# Patient Record
Sex: Male | Born: 1960
Health system: Southern US, Community
[De-identification: ages and names within clinical notes are randomized; demographics above are authoritative.]

## PROBLEM LIST (undated history)

## (undated) DIAGNOSIS — E669 Obesity, unspecified: Secondary | ICD-10-CM

## (undated) DIAGNOSIS — F1020 Alcohol dependence, uncomplicated: Secondary | ICD-10-CM

## (undated) DIAGNOSIS — F32A Depression, unspecified: Secondary | ICD-10-CM

## (undated) DIAGNOSIS — F419 Anxiety disorder, unspecified: Secondary | ICD-10-CM

## (undated) DIAGNOSIS — K8020 Calculus of gallbladder without cholecystitis without obstruction: Secondary | ICD-10-CM

## (undated) DIAGNOSIS — E119 Type 2 diabetes mellitus without complications: Secondary | ICD-10-CM

## (undated) DIAGNOSIS — M1990 Unspecified osteoarthritis, unspecified site: Secondary | ICD-10-CM

## (undated) DIAGNOSIS — Z87442 Personal history of urinary calculi: Secondary | ICD-10-CM

## (undated) DIAGNOSIS — I10 Essential (primary) hypertension: Secondary | ICD-10-CM

## (undated) DIAGNOSIS — K219 Gastro-esophageal reflux disease without esophagitis: Secondary | ICD-10-CM

## (undated) DIAGNOSIS — N200 Calculus of kidney: Secondary | ICD-10-CM

## (undated) DIAGNOSIS — F329 Major depressive disorder, single episode, unspecified: Secondary | ICD-10-CM

## (undated) DIAGNOSIS — T8859XA Other complications of anesthesia, initial encounter: Secondary | ICD-10-CM

## (undated) DIAGNOSIS — R600 Localized edema: Secondary | ICD-10-CM

## (undated) HISTORY — DX: Calculus of kidney: N20.0

## (undated) HISTORY — PX: LEG SURGERY: SHX1003

## (undated) HISTORY — DX: Calculus of gallbladder without cholecystitis without obstruction: K80.20

## (undated) HISTORY — DX: Major depressive disorder, single episode, unspecified: F32.9

## (undated) HISTORY — DX: Gastro-esophageal reflux disease without esophagitis: K21.9

## (undated) HISTORY — DX: Unspecified osteoarthritis, unspecified site: M19.90

## (undated) HISTORY — PX: ANKLE SURGERY: SHX546

## (undated) HISTORY — PX: KNEE SURGERY: SHX244

## (undated) HISTORY — DX: Type 2 diabetes mellitus without complications: E11.9

## (undated) HISTORY — DX: Alcohol dependence, uncomplicated: F10.20

## (undated) HISTORY — DX: Essential (primary) hypertension: I10

## (undated) HISTORY — DX: Obesity, unspecified: E66.9

## (undated) HISTORY — DX: Anxiety disorder, unspecified: F41.9

## (undated) HISTORY — DX: Depression, unspecified: F32.A

---

## 2001-04-08 ENCOUNTER — Emergency Department (HOSPITAL_COMMUNITY): Admission: EM | Admit: 2001-04-08 | Discharge: 2001-04-08 | Payer: Self-pay | Admitting: Internal Medicine

## 2001-10-31 ENCOUNTER — Inpatient Hospital Stay (HOSPITAL_COMMUNITY): Admission: EM | Admit: 2001-10-31 | Discharge: 2001-11-01 | Payer: Self-pay | Admitting: *Deleted

## 2001-10-31 ENCOUNTER — Encounter: Payer: Self-pay | Admitting: *Deleted

## 2004-09-11 ENCOUNTER — Ambulatory Visit: Payer: Self-pay | Admitting: Internal Medicine

## 2004-09-13 ENCOUNTER — Encounter: Admission: RE | Admit: 2004-09-13 | Discharge: 2004-09-13 | Payer: Self-pay | Admitting: Internal Medicine

## 2004-09-25 ENCOUNTER — Ambulatory Visit: Payer: Self-pay | Admitting: Internal Medicine

## 2004-10-23 ENCOUNTER — Ambulatory Visit: Payer: Self-pay | Admitting: Internal Medicine

## 2004-10-30 ENCOUNTER — Ambulatory Visit: Payer: Self-pay | Admitting: Internal Medicine

## 2004-11-06 ENCOUNTER — Ambulatory Visit: Payer: Self-pay

## 2004-11-22 ENCOUNTER — Ambulatory Visit: Payer: Self-pay

## 2007-04-09 ENCOUNTER — Encounter: Admission: RE | Admit: 2007-04-09 | Discharge: 2007-04-09 | Payer: Self-pay | Admitting: Sports Medicine

## 2008-03-01 ENCOUNTER — Inpatient Hospital Stay (HOSPITAL_COMMUNITY): Admission: EM | Admit: 2008-03-01 | Discharge: 2008-03-09 | Payer: Self-pay | Admitting: Emergency Medicine

## 2009-11-19 ENCOUNTER — Encounter
Admission: RE | Admit: 2009-11-19 | Discharge: 2009-11-20 | Payer: Self-pay | Admitting: Physical Medicine & Rehabilitation

## 2009-11-20 ENCOUNTER — Ambulatory Visit: Payer: Self-pay | Admitting: Physical Medicine & Rehabilitation

## 2010-09-10 ENCOUNTER — Emergency Department (HOSPITAL_COMMUNITY)
Admission: EM | Admit: 2010-09-10 | Discharge: 2010-09-10 | Payer: Self-pay | Source: Home / Self Care | Admitting: Emergency Medicine

## 2010-09-11 LAB — URINALYSIS, ROUTINE W REFLEX MICROSCOPIC
Bilirubin Urine: NEGATIVE
Nitrite: POSITIVE — AB
Specific Gravity, Urine: 1.025 (ref 1.005–1.030)
Urine Glucose, Fasting: 250 mg/dL — AB
Urobilinogen, UA: 0.2 mg/dL (ref 0.0–1.0)
pH: 6 (ref 5.0–8.0)

## 2010-09-11 LAB — DIFFERENTIAL
Basophils Absolute: 0 10*3/uL (ref 0.0–0.1)
Basophils Relative: 0 % (ref 0–1)
Eosinophils Absolute: 0 10*3/uL (ref 0.0–0.7)
Eosinophils Relative: 0 % (ref 0–5)
Lymphocytes Relative: 9 % — ABNORMAL LOW (ref 12–46)
Lymphs Abs: 1.1 10*3/uL (ref 0.7–4.0)
Monocytes Absolute: 0.4 10*3/uL (ref 0.1–1.0)
Monocytes Relative: 3 % (ref 3–12)
Neutro Abs: 11.1 10*3/uL — ABNORMAL HIGH (ref 1.7–7.7)
Neutrophils Relative %: 88 % — ABNORMAL HIGH (ref 43–77)

## 2010-09-11 LAB — BASIC METABOLIC PANEL
BUN: 13 mg/dL (ref 6–23)
CO2: 22 mEq/L (ref 19–32)
Calcium: 9.1 mg/dL (ref 8.4–10.5)
Chloride: 106 mEq/L (ref 96–112)
Creatinine, Ser: 1.11 mg/dL (ref 0.4–1.5)
GFR calc Af Amer: >60 mL/min (ref >60)
GFR calc non Af Amer: >60 mL/min (ref >60)
Glucose, Bld: 161 mg/dL — ABNORMAL HIGH (ref 70–99)
Potassium: 4 mEq/L (ref 3.5–5.1)
Sodium: 137 mEq/L (ref 135–145)

## 2010-09-11 LAB — CBC
HCT: 43.6 % (ref 39.0–52.0)
Hemoglobin: 16.3 g/dL (ref 13.0–17.0)
MCH: 33.3 pg (ref 26.0–34.0)
MCHC: 37.4 g/dL — ABNORMAL HIGH (ref 30.0–36.0)
MCV: 89.2 fL (ref 78.0–100.0)
Platelets: 178 10*3/uL (ref 150–400)
RBC: 4.89 MIL/uL (ref 4.22–5.81)
RDW: 12.5 % (ref 11.5–15.5)
WBC: 12.6 10*3/uL — ABNORMAL HIGH (ref 4.0–10.5)

## 2010-09-11 LAB — URINE MICROSCOPIC-ADD ON

## 2010-09-12 ENCOUNTER — Emergency Department (HOSPITAL_COMMUNITY)
Admission: EM | Admit: 2010-09-12 | Discharge: 2010-09-12 | Payer: Self-pay | Source: Home / Self Care | Admitting: Emergency Medicine

## 2010-09-16 LAB — URINE MICROSCOPIC-ADD ON

## 2010-09-16 LAB — URINALYSIS, ROUTINE W REFLEX MICROSCOPIC
Specific Gravity, Urine: 1.025 (ref 1.005–1.030)
Urine Glucose, Fasting: NEGATIVE mg/dL
pH: 6 (ref 5.0–8.0)

## 2010-09-16 LAB — BASIC METABOLIC PANEL
BUN: 11 mg/dL (ref 6–23)
Chloride: 103 mEq/L (ref 96–112)
Glucose, Bld: 140 mg/dL — ABNORMAL HIGH (ref 70–99)
Potassium: 3.4 mEq/L — ABNORMAL LOW (ref 3.5–5.1)
Sodium: 133 mEq/L — ABNORMAL LOW (ref 135–145)

## 2010-09-16 LAB — CBC
HCT: 39.7 % (ref 39.0–52.0)
Hemoglobin: 14.6 g/dL (ref 13.0–17.0)
MCV: 89.6 fL (ref 78.0–100.0)
RBC: 4.43 MIL/uL (ref 4.22–5.81)
WBC: 8.8 10*3/uL (ref 4.0–10.5)

## 2010-09-16 LAB — DIFFERENTIAL
Eosinophils Relative: 0 % (ref 0–5)
Lymphocytes Relative: 3 % — ABNORMAL LOW (ref 12–46)
Lymphs Abs: 0.3 10*3/uL — ABNORMAL LOW (ref 0.7–4.0)
Neutro Abs: 8.1 10*3/uL — ABNORMAL HIGH (ref 1.7–7.7)
Neutrophils Relative %: 91 % — ABNORMAL HIGH (ref 43–77)

## 2010-09-17 LAB — URINE CULTURE: Culture  Setup Time: 201201192205
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
## 2011-01-10 NOTE — Cardiovascular Report (Signed)
. [facility]  Patient:    CASS, VANDERMEULEN Visit Number: [id number] MRN: [id number]          Service Type: MED Location: [phone] Attending Physician:  Ophelia Shoulder Dictated by:   Lenise Herald, M.D. Proc. Date: 11/01/01 Admit Date:  10/31/2001 Discharge Date: 11/01/2001   CC:         Netta Cedars, M.D.   Cardiac Catheterization  PROCEDURES: 1. Left heart catheterization. 2. Coronary angiography. 3. Left ventriculogram.  ATTENDING:  Lenise Herald, M.D.  COMPLICATIONS:  None.  INDICATIONS:  Mr. Sheeran is a 50 year old male who presented to the ER on October 31, 2001, complaining of substernal chest pain radiating to his left arm associated with shortness of breath and diaphoresis.  He does have a significant family history of CAD.  EKG revealed no acute changes.  He subsequently ruled out for myocardial infarction; however, given his strong symptoms and chest pain relieved with nitroglycerin, he is now referred for cardiac catheterization.  DESCRIPTION OF PROCEDURE:  After giving informed written consent, the patient was brought to the cardiac catheterization lab where his right and left groins were shaved, prepped, and draped in the usual sterile fashion.  ECG monitoring was established.  Using the modified Seldinger technique, a #6 French arterial sheath was inserted in the right femoral artery.  A #6 French diagnostic catheter was then used to perform diagnostic angiography.  CATHETERIZATION FINDINGS: 1. This revealed a large left main with no significant disease.  2. The LAD is a large vessel which coursed to the apex and gave rise to 2    diagonal branches.  The LAD has no significant disease.  The first and    second diagonals are medium sized vessels with no significant disease.  3. The left coronary artery also gives rise to a large ramus intermedius    which bifurcates in the mid segment and has no  significant disease.  4. The left circumflex is a large vessel which coursed in the AV groove and    gave rise to 1 large obtuse marginal branch.  The AV groove circumflex    has no significant disease.  The first OM is a large vessel which    bifurcates in its mid segment and has no significant disease.  5. The right coronary artery is a medium sized vessel which is dominant and    gives rise to a PDA as well as a posterolateral branch.  There is no    significant disease in the RCA, PDA, or posterolateral branch.  LEFT VENTRICULOGRAM:  A left ventriculogram reveals a preserved EF of 60%.  HEMODYNAMICS:  Systemic arterial pressure 135/72, LV systemic pressure 134/10, LVEDP of 22.  CONCLUSIONS: 1. No significant coronary artery disease. 2. Normal left ventricular systolic function. 3. Elevated LVEDP pressure. Dictated by:   Lenise Herald, M.D. Attending Physician:  Ophelia Shoulder DD:  11/01/01 TD:  11/01/01 Job: [id number]

## 2011-01-10 NOTE — Discharge Summary (Signed)
[facility]  Patient:    [name], [name] Visit Number: [id number] MRN: [id number]          Service Type: MED Location: [phone] Attending Physician:  Ophelia Shoulder Dictated by:   Halford Decamp Delanna Ahmadi, R.N., N.P. Admit Date:  10/31/2001 Discharge Date: 11/01/2001                             Discharge Summary  HISTORY OF PRESENT ILLNESS:  [name] is a 50 year old married white male patient without any coronary artery disease history.  He also does not have any hypertension, diabetes, or known elevated cholesterol.  He does have tobacco smoking.  Apparently was a two-pack-per-day smoker.  He quit five weeks prior to admission.  He came into the emergency room with chest pain and pressure like something was sitting on his chest, left arm numbness, diaphoresis and shortness of breath and symptoms were unrelated to any physical activity.  They occur at rest or with emotional upsets.  He was seen in the emergency room.  HOSPITAL COURSE:  He was admitted for unstable angina.  He was chest pain responsive with nitroglycerin.  It was decided that he should undergo cardiac catheterization for definite diagnosis. He apparently has problem with excessive worry and depression.  He had recurrent chest pain while in the hospital on IV nitroglycerin.  His CK-MBs were negative and troponins were negative.  He went on to have a cardiac catheterization on November 01, 2001, by Lenise Herald, M.D.  He had normal coronary arteries with an EF of 60%.  Post procedure, his groin was without any complications and he was sent home later that evening.  DISCHARGE DIAGNOSES: 1. Chest pain not ischemic related with normal coronary arteries. 2. Unknown cholesterol. 3. Obesity. 4. Excessive worry and depression. Dictated by:   Halford Decamp Delanna Ahmadi, R.N., N.P. Attending Physician:  Ophelia Shoulder DD:  11/08/01 TD:  11/09/01 Job: [id number]

## 2011-03-01 ENCOUNTER — Inpatient Hospital Stay: Admit: 2011-03-01 | Discharge: 2011-03-01 | Attending: Emergency Medicine

## 2011-03-01 MED ADMIN — Tdap (ADACEL) 5-2-15.5 LF-MCG/0.5 injection: INTRAMUSCULAR | @ 17:00:00 | NDC 49281040010

## 2011-03-01 MED FILL — ADACEL 5-2-15.5 LF-MCG/0.5 IM SUSP: 5-2-15.5 LF-MCG/0.5 | INTRAMUSCULAR | Qty: 0.5

## 2011-03-01 NOTE — Discharge Instructions (Signed)
IMPORTANT:  If you have any trouble getting in to see the physician that we have referred you to today, please call the [facility] at [phone].  Please leave a voice message if they are unavailable and they will return your call.    If you were prescribed an outpatient test, please call [facility] at [phone] to schedule an appointment for your test that was ordered.         DIAGNOSIS:  The encounter diagnosis was Laceration of leg, left.      ADDITIONAL INSTRUCTIONS FOR ALL PATIENTS:  -If you have been prescribed an antibiotic TAKE IT AS DIRECTED UNTIL IT IS ALL FINISHED.  -If you HAVE RECEIVED OR BEEN PRESCRIBED A MEDICATION THAT MAY CAUSE DROWSINESS. DO NOT DRIVE, DRINK ALCOHOL, OR OPERATE MACHINERY THAT REQUIRES YOU TO BE ALERT.    -If you had an EKG and/or X-Ray reading made in the Emergency Department, it will be reviewed by a Cardiologist and/or Radiologist. If the review changes your diagnosis, you will be contacted.  -If you had a specimen collected for culture, a CULTURE REPORT takes 48-72 hours to generate: You will be contacted if a change in treatment is needed.    -Return if your condition worsens or if you have severe pain, worsening of symptoms such as fever, vomiting or difficulty breathing.      Laceration Care  A laceration is a cut or lesion that goes through all layers of the skin and into the tissue just beneath the skin.  BEFORE THE PROCEDURE  The laceration will be inspected by your caregiver for amount and extent of tissue damage, for bleeding, for evidence of foreign bodies (pieces of dirt, glass, etc.), and for cleanliness. Pain medications can be used if necessary. The wound will then be cleansed to help prevent infection. Sutures, staples or skin adhesive strips will be used to close the wound, stop bleeding and speed healing. Sometimes this will decrease the likelihood of infection and bleeding.  LET YOUR CAREGIVERS KNOW ABOUT THE  FOLLOWING:   Allergies.    Medications taken including herbs, eye drops, over the counter medications, and creams.      Use of steroids (by mouth or creams).      Previous problems with anesthetics or novocaine.     Possibility of pregnancy, if this applies.    History of blood clots (thrombophlebitis).      History of bleeding or blood problems.      Previous surgery.      Other health problems.      RISKS AND COMPLICATIONS  Most lacerations heal fully. The healing time required varies depending on location. Complications of a laceration can include pain, bleeding, infection, dehiscence (splitting open or separation of the wound edges) and scar formation. The likelihood of complication depends on wound complexity, location, and on how the laceration occurred.  HOME CARE INSTRUCTIONS   If you were given a dressing, you should change it at least once a day, or as instructed by your caregiver. If the bandage sticks, soak it off with water.    Twice a day, wash the area with soap and water and rinse with plain water to remove all soap. Pat (do not rub) dry with a clean towel. Look for signs of infection (see below).    Re-apply prescribed creams or ointments as instructed. This will help prevent infection. This also helps keep the bandage from sticking.    If the bandage becomes wet, dirty, or  develops a foul smell, change it as soon as possible.    Only take over-the-counter or prescription medicines for pain, discomfort, or fever as directed by your doctor.    Have your sutures, staples or skin adhesive strips removed as instructed. Skin adhesive strips will peel off from the outer edges toward the center and will eventually fall off. Report to your caregiver if the strips are falling off and the wound does not appear fully healed.   SEEK MEDICAL CARE IF:   There is redness, swelling, or increasing pain in the wound.    There is a red line that goes up your arm or leg.    Pus is coming from wound.     You develop an unexplained oral temperature above 102 F (38.9 C), or as your caregiver suggests.    You notice a foul smell coming from the wound or dressing.    There is a breaking open of the wound (edges not staying together) before or after sutures have been removed.    You notice something coming out of the wound such as wood or glass.    The wound is on your hand or foot and you find that you are unable to properly move a finger or toe.    There is severe swelling around the wound causing pain and numbness or a change in color in your arm, hand, leg, or foot.   SEEK IMMEDIATE MEDICAL CARE IF:   Pain is not controlled with prescribed medication or with acetaminophen or ibuprofen.    There is severe swelling around the wound site.    The wound splits open and bleeding recurs.    You experience worsening numbness, weakness, or loss of function of any joint around or beyond the wound.    You develop painful lumps near the wound or on the skin anywhere on your body.   If you did not receive a tetanus shot today because you did not recall when your last one was given, make sure to check with your caregiver when you have your sutures removed to determine if you need one.  MAKE SURE YOU:     Understand these instructions.    Will watch your condition.    Will get help right away if you are not doing well or get worse.   Document Released: 08/11/2005 Document Re-Released: 11/05/2009  [facility] Patient Information 2012 [place].

## 2011-03-01 NOTE — ED Notes (Signed)
Laceration anesthesized , prepped and sutured per MD.     Gerarda Fraction, RN  03/01/11 1332

## 2011-03-01 NOTE — ED Notes (Signed)
To room # 1. Ambulatory. Family at bedside. Bleeding controlled.     Gerarda Fraction, RN  03/01/11 1233

## 2011-03-01 NOTE — ED Provider Notes (Signed)
Patient is a 50 y.o. male presenting with skin laceration.   Laceration   The incident occurred 1 to 2 hours ago. The laceration is located on the left leg. The laceration is 7 cm in size. The laceration mechanism was a a metal edge. He reports no foreign bodies present. His tetanus status is out of date.       Review of Systems   HENT: Negative for neck pain.    Respiratory: Negative for shortness of breath.    Skin: Negative.    Neurological: Negative for numbness.   [all other systems reviewed and are negative          PAST MEDICAL HISTORY   has no past medical history on file.    PAST SURGICAL HISTORY   has past surgical history that includes Anterior cruciate ligament repair.    FAMILY HISTORY  family history is not on file.    SOCIAL HISTORY   does not have a smoking history on file. He does not have any smokeless tobacco history on file.    HOME MEDICATIONS     Prior to Admission medications    Not on File        ALLERGIES   has no known allergies.     Physical Exam   [vitalsreviewed.  Constitutional:  Non-toxic appearance. No distress.   HENT:   Head: Normocephalic and atraumatic.   Eyes: Conjunctivae and EOM are normal. Pupils are equal, round, and reactive to light.   Musculoskeletal:        Legs:  Neurological: He is alert.   Skin: Skin is warm and dry. No rash noted.       Lac Repair  Performed by: [facility], Swayze Kozuch H  Authorized by: Ladona Ridgel  Consent: Verbal consent obtained.  Patient understanding: patient states understanding of the procedure being performed  Body area: lower extremity  Location details: left lower leg  Laceration length: 7 cm  Contamination: The wound is contaminated.  Tendon involvement: none  Nerve involvement: none  Vascular damage: no  Anesthesia: local infiltration  Local anesthetic: lidocaine 1% with epinephrine  Anesthetic total: 8 ml  Patient sedated: no  Irrigation solution: saline  Irrigation method: jet lavage  Debridement: none  Skin closure: 5-0 nylon  Subcutaneous  closure: 4-0 Vicryl  Number of sutures: 13  Technique: running  Approximation: close  Dressing: antibiotic ointment and 4x4 sterile gauze  Patient tolerance: Patient tolerated the procedure well with no immediate complications.        MDM    Labs      Radiology      EKG Interpretation.        Ladona Ridgel, MD  03/01/11 [phone]

## 2011-03-01 NOTE — ED Notes (Signed)
Ambulatory from department without difficulty.  No distress noted.  Pt instructed to follow up with family doctor or doctor referred by ED physician.  Pt also given number to the Pt advocate.  Pt reports no further needs or questions prior to discharge.     Harvel Quale, RN  03/01/11 1401

## 2011-05-22 LAB — ABO/RH: ABO/RH(D): B POS

## 2011-05-22 LAB — BLOOD GAS, ARTERIAL
Acid-base deficit: 3.8 — ABNORMAL HIGH
Bicarbonate: 22.2
Patient temperature: 98.6
TCO2: 23.7
pO2, Arterial: 58 — ABNORMAL LOW

## 2011-05-22 LAB — DIFFERENTIAL
Basophils Absolute: 0
Basophils Relative: 0
Eosinophils Absolute: 0.1
Eosinophils Relative: 2
Lymphocytes Relative: 13
Lymphs Abs: 1.1
Monocytes Absolute: 0.6
Monocytes Relative: 7
Neutro Abs: 6.5
Neutrophils Relative %: 78 — ABNORMAL HIGH

## 2011-05-22 LAB — URINALYSIS, ROUTINE W REFLEX MICROSCOPIC
Glucose, UA: NEGATIVE
Ketones, ur: 15 — AB
Leukocytes, UA: NEGATIVE
Nitrite: NEGATIVE
Protein, ur: 30 — AB
Specific Gravity, Urine: >1.046 — ABNORMAL HIGH
Urobilinogen, UA: 0.2
pH: 5

## 2011-05-22 LAB — HEPATIC FUNCTION PANEL
ALT: 65 — ABNORMAL HIGH
Bilirubin, Direct: 0.3
Indirect Bilirubin: 0.8
Total Protein: 4.8 — ABNORMAL LOW

## 2011-05-22 LAB — CBC
HCT: 29.4 — ABNORMAL LOW
HCT: 30.4 — ABNORMAL LOW
HCT: 46.7
Hemoglobin: 16.1
MCHC: 34.5
MCHC: 34.8
MCHC: 35
MCHC: 35.1
MCHC: 35.3
MCV: 94.7
MCV: 94.8
MCV: 94.9
MCV: 95.7
Platelets: 126 — ABNORMAL LOW
Platelets: 177
Platelets: 75 — ABNORMAL LOW
Platelets: 92 — ABNORMAL LOW
RBC: 2.77 — ABNORMAL LOW
RBC: 4.88
RDW: 13.4
RDW: 13.4
RDW: 13.4
RDW: 13.4
RDW: 13.4
RDW: 13.6
WBC: 17.7 — ABNORMAL HIGH
WBC: 5.6
WBC: 6.4
WBC: 9.8

## 2011-05-22 LAB — POCT I-STAT, CHEM 8
BUN: 13
Calcium, Ion: 1.14
Chloride: 106
Creatinine, Ser: 1
Glucose, Bld: 134 — ABNORMAL HIGH
HCT: 47
Hemoglobin: 16
Potassium: 4
Sodium: 139
TCO2: 22

## 2011-05-22 LAB — TYPE AND SCREEN: Antibody Screen: NEGATIVE

## 2011-05-22 LAB — POCT I-STAT 4, (NA,K, GLUC, HGB,HCT)
Glucose, Bld: 113 — ABNORMAL HIGH
Glucose, Bld: 135 — ABNORMAL HIGH
HCT: 30 — ABNORMAL LOW
HCT: 31 — ABNORMAL LOW
Hemoglobin: 10.2 — ABNORMAL LOW
Operator id: 147011
Operator id: 147011
Potassium: 4.4
Potassium: 5
Sodium: 136

## 2011-05-22 LAB — PROTIME-INR
INR: 1.3
INR: 1.6 — ABNORMAL HIGH
Prothrombin Time: 16.7 — ABNORMAL HIGH
Prothrombin Time: 19.4 — ABNORMAL HIGH

## 2011-05-22 LAB — URINE MICROSCOPIC-ADD ON

## 2011-05-22 LAB — BASIC METABOLIC PANEL
BUN: 10
BUN: 12
BUN: 13
BUN: 14
CO2: 24
CO2: 26
Calcium: 7.8 — ABNORMAL LOW
Chloride: 101
Chloride: 105
Chloride: 105
Creatinine, Ser: 1.06
GFR calc Af Amer: >60
GFR calc non Af Amer: >60
GFR calc non Af Amer: >60
Glucose, Bld: 127 — ABNORMAL HIGH
Glucose, Bld: 80
Glucose, Bld: 91
Potassium: 3.5
Potassium: 4.1
Potassium: 4.2
Sodium: 136

## 2011-05-22 LAB — RAPID URINE DRUG SCREEN, HOSP PERFORMED
Amphetamines: NOT DETECTED
Barbiturates: NOT DETECTED
Benzodiazepines: POSITIVE — AB
Cocaine: NOT DETECTED
Opiates: POSITIVE — AB
Tetrahydrocannabinol: NOT DETECTED

## 2011-05-22 LAB — HEPARIN INDUCED THROMBOCYTOPENIA PNL
Heparin Induced Plt Ab: NEGATIVE
Patient O.D.: 0.171
Serotonin Release: 0 % release (ref <20)

## 2011-05-22 LAB — HEPARIN ANTIBODY SCREEN: Heparin Antibody Screen: NEGATIVE

## 2011-05-23 LAB — CBC
MCV: 94.8
Platelets: 152
WBC: 9.1

## 2011-05-23 LAB — BASIC METABOLIC PANEL
BUN: 13
Calcium: 8.1 — ABNORMAL LOW
Chloride: 104
Creatinine, Ser: 0.86

## 2011-05-23 LAB — PROTIME-INR
INR: 3.2 — ABNORMAL HIGH
Prothrombin Time: 29.4 — ABNORMAL HIGH

## 2012-07-26 ENCOUNTER — Other Ambulatory Visit: Payer: Self-pay | Admitting: Orthopedic Surgery

## 2012-07-26 DIAGNOSIS — IMO0002 Reserved for concepts with insufficient information to code with codable children: Secondary | ICD-10-CM | POA: Diagnosis not present

## 2012-07-26 DIAGNOSIS — M5410 Radiculopathy, site unspecified: Secondary | ICD-10-CM

## 2012-07-26 DIAGNOSIS — M5137 Other intervertebral disc degeneration, lumbosacral region: Secondary | ICD-10-CM | POA: Diagnosis not present

## 2012-07-31 ENCOUNTER — Ambulatory Visit
Admission: RE | Admit: 2012-07-31 | Discharge: 2012-07-31 | Disposition: A | Discharge status: Home / Self Care | Payer: Medicare Other | Source: Ambulatory Visit | Attending: Orthopedic Surgery | Admitting: Orthopedic Surgery

## 2012-07-31 DIAGNOSIS — M5410 Radiculopathy, site unspecified: Secondary | ICD-10-CM

## 2012-07-31 DIAGNOSIS — M48061 Spinal stenosis, lumbar region without neurogenic claudication: Secondary | ICD-10-CM | POA: Diagnosis not present

## 2012-07-31 DIAGNOSIS — M5126 Other intervertebral disc displacement, lumbar region: Secondary | ICD-10-CM | POA: Diagnosis not present

## 2012-08-16 ENCOUNTER — Other Ambulatory Visit: Payer: Self-pay | Admitting: Orthopedic Surgery

## 2012-08-16 DIAGNOSIS — M5030 Other cervical disc degeneration, unspecified cervical region: Secondary | ICD-10-CM

## 2012-10-22 ENCOUNTER — Ambulatory Visit
Admission: RE | Admit: 2012-10-22 | Discharge: 2012-10-22 | Disposition: A | Discharge status: Home / Self Care | Payer: Medicare Other | Source: Ambulatory Visit | Attending: Orthopedic Surgery | Admitting: Orthopedic Surgery

## 2012-10-22 VITALS — BP 170/94 | HR 71

## 2012-10-22 DIAGNOSIS — M5030 Other cervical disc degeneration, unspecified cervical region: Secondary | ICD-10-CM

## 2012-10-22 DIAGNOSIS — M47817 Spondylosis without myelopathy or radiculopathy, lumbosacral region: Secondary | ICD-10-CM | POA: Diagnosis not present

## 2012-10-22 DIAGNOSIS — M5126 Other intervertebral disc displacement, lumbar region: Secondary | ICD-10-CM | POA: Diagnosis not present

## 2012-10-22 DIAGNOSIS — M48061 Spinal stenosis, lumbar region without neurogenic claudication: Secondary | ICD-10-CM | POA: Diagnosis not present

## 2012-10-22 MED ORDER — METHYLPREDNISOLONE ACETATE 40 MG/ML INJ SUSP (RADIOLOG: 120.0000 mg | Freq: Once | INTRAMUSCULAR | Status: DC

## 2012-10-22 MED ORDER — IOHEXOL 180 MG/ML  SOLN: 1.0000 mL | Freq: Once | INTRAMUSCULAR | Status: AC | PRN

## 2013-02-15 ENCOUNTER — Other Ambulatory Visit: Payer: Self-pay | Admitting: Orthopedic Surgery

## 2013-02-15 DIAGNOSIS — IMO0002 Reserved for concepts with insufficient information to code with codable children: Secondary | ICD-10-CM

## 2013-02-15 DIAGNOSIS — M1990 Unspecified osteoarthritis, unspecified site: Secondary | ICD-10-CM

## 2013-02-21 ENCOUNTER — Ambulatory Visit
Admission: RE | Admit: 2013-02-21 | Discharge: 2013-02-21 | Disposition: A | Discharge status: Home / Self Care | Payer: Medicare Other | Source: Ambulatory Visit | Attending: Orthopedic Surgery | Admitting: Orthopedic Surgery

## 2013-02-21 VITALS — BP 154/92 | HR 61

## 2013-02-21 DIAGNOSIS — M1990 Unspecified osteoarthritis, unspecified site: Secondary | ICD-10-CM

## 2013-02-21 DIAGNOSIS — IMO0002 Reserved for concepts with insufficient information to code with codable children: Secondary | ICD-10-CM

## 2013-02-21 DIAGNOSIS — M47817 Spondylosis without myelopathy or radiculopathy, lumbosacral region: Secondary | ICD-10-CM | POA: Diagnosis not present

## 2013-02-21 DIAGNOSIS — M5126 Other intervertebral disc displacement, lumbar region: Secondary | ICD-10-CM | POA: Diagnosis not present

## 2013-02-21 MED ORDER — METHYLPREDNISOLONE ACETATE 40 MG/ML INJ SUSP (RADIOLOG
120.0000 mg | Freq: Once | INTRAMUSCULAR | Status: AC
  Administered 2013-02-21: 120 mg via EPIDURAL

## 2013-02-21 MED ORDER — IOHEXOL 180 MG/ML  SOLN
1.0000 mL | Freq: Once | INTRAMUSCULAR | Status: AC | PRN
  Administered 2013-02-21: 1 mL via INTRAVENOUS

## 2013-03-07 DIAGNOSIS — E119 Type 2 diabetes mellitus without complications: Secondary | ICD-10-CM | POA: Diagnosis not present

## 2013-03-07 DIAGNOSIS — E109 Type 1 diabetes mellitus without complications: Secondary | ICD-10-CM | POA: Diagnosis not present

## 2013-03-07 DIAGNOSIS — E7800 Pure hypercholesterolemia, unspecified: Secondary | ICD-10-CM | POA: Diagnosis not present

## 2013-03-14 DIAGNOSIS — M542 Cervicalgia: Secondary | ICD-10-CM | POA: Diagnosis not present

## 2013-03-14 DIAGNOSIS — K219 Gastro-esophageal reflux disease without esophagitis: Secondary | ICD-10-CM | POA: Diagnosis not present

## 2013-03-17 DIAGNOSIS — R945 Abnormal results of liver function studies: Secondary | ICD-10-CM | POA: Diagnosis not present

## 2013-03-17 DIAGNOSIS — K7689 Other specified diseases of liver: Secondary | ICD-10-CM | POA: Diagnosis not present

## 2013-03-17 DIAGNOSIS — R7989 Other specified abnormal findings of blood chemistry: Secondary | ICD-10-CM | POA: Diagnosis not present

## 2013-03-23 DIAGNOSIS — M542 Cervicalgia: Secondary | ICD-10-CM | POA: Diagnosis not present

## 2013-06-13 DIAGNOSIS — K219 Gastro-esophageal reflux disease without esophagitis: Secondary | ICD-10-CM | POA: Diagnosis not present

## 2013-06-13 DIAGNOSIS — E109 Type 1 diabetes mellitus without complications: Secondary | ICD-10-CM | POA: Diagnosis not present

## 2013-06-20 ENCOUNTER — Encounter: Payer: Self-pay | Admitting: Gastroenterology

## 2013-06-20 DIAGNOSIS — K219 Gastro-esophageal reflux disease without esophagitis: Secondary | ICD-10-CM | POA: Diagnosis not present

## 2013-06-20 DIAGNOSIS — R7989 Other specified abnormal findings of blood chemistry: Secondary | ICD-10-CM | POA: Diagnosis not present

## 2013-06-20 DIAGNOSIS — F411 Generalized anxiety disorder: Secondary | ICD-10-CM | POA: Diagnosis not present

## 2013-06-20 DIAGNOSIS — M542 Cervicalgia: Secondary | ICD-10-CM | POA: Diagnosis not present

## 2013-06-20 DIAGNOSIS — I10 Essential (primary) hypertension: Secondary | ICD-10-CM | POA: Diagnosis not present

## 2013-06-30 ENCOUNTER — Other Ambulatory Visit: Payer: Self-pay

## 2013-07-06 ENCOUNTER — Ambulatory Visit: Payer: Medicare Other | Admitting: Gastroenterology

## 2013-07-26 ENCOUNTER — Encounter: Payer: Self-pay | Admitting: Gastroenterology

## 2013-07-26 ENCOUNTER — Other Ambulatory Visit: Payer: Medicare Other

## 2013-07-26 ENCOUNTER — Ambulatory Visit (INDEPENDENT_AMBULATORY_CARE_PROVIDER_SITE_OTHER): Payer: Medicare Other | Admitting: Gastroenterology

## 2013-07-26 VITALS — BP 118/78 | HR 73 | Ht 68.0 in | Wt 317.0 lb

## 2013-07-26 DIAGNOSIS — Z1211 Encounter for screening for malignant neoplasm of colon: Secondary | ICD-10-CM

## 2013-07-26 DIAGNOSIS — K8020 Calculus of gallbladder without cholecystitis without obstruction: Secondary | ICD-10-CM

## 2013-07-26 DIAGNOSIS — R7989 Other specified abnormal findings of blood chemistry: Secondary | ICD-10-CM | POA: Insufficient documentation

## 2013-07-26 DIAGNOSIS — R945 Abnormal results of liver function studies: Secondary | ICD-10-CM

## 2013-07-26 MED ORDER — NA SULFATE-K SULFATE-MG SULF 17.5-3.13-1.6 GM/177ML PO SOLN: 1.0000 | Freq: Once | ORAL | Status: DC

## 2013-07-26 NOTE — Progress Notes (Signed)
History of Present Illness: [name] 52 year old white male referred for evaluation of abnormal liver tests.  This was noted on routine testing.  On October 20 AST was 72 and ALT 90.  In 2009 transaminases were 116 and 65, respectively.  Other liver tests and albumin were normal.  He's been told to have a fatty liver.  He has a history of alcohol abuse but greatly reduced consumption over year ago.  At this point he has perhaps 2-6 beers on a given night but not more than once a week.  Recent ultrasound, by report, demonstrated multiple stones and hepatic steatosis.  He denies abdominal pain, jaundice or pruritus.  There is no history of hepatitis, drug abuse or blood transfusions.    Past Medical History  Diagnosis Date  . Alcoholism   . Anxiety   . Arthritis   . Depression   . Diabetes   . Gallstones   . Kidney stones   . Obesity    Past Surgical History  Procedure Laterality Date  . Leg surgery      bilateral  . Ankle surgery      left  . Knee surgery      right   family history includes Cancer in his sister and another family member; Liver cancer in his mother. Current Outpatient Prescriptions  Medication Sig Dispense Refill  . ALPRAZolam (XANAX) 0.5 MG tablet Take 0.5 mg by mouth at bedtime as needed for anxiety.      [name] HYDROcodone-acetaminophen (NORCO) 10-325 MG per tablet Take 1 tablet by mouth every 6 (six) hours as needed.      . Ranitidine HCl (ZANTAC 75 PO) Take 1 tablet by mouth daily.       No current facility-administered medications for this visit.   Allergies as of 07/26/2013 - Review Complete 07/26/2013  Allergen Reaction Noted  . Clindamycin/lincomycin Rash 02/21/2013  . Penicillins Nausea And Vomiting and Other (See Comments) 02/21/2013    reports that he quit smoking about 11 months ago. His smoking use included Cigarettes. He has a 40 pack-year smoking history. He has never used smokeless tobacco. He reports that he drinks alcohol. His drug history is not on  file.     Review of Systems: Pertinent positive and negative review of systems were noted in the above HPI section. All other review of systems were otherwise negative.  Vital signs were reviewed in today's medical record Physical Exam: General: Obese male in no acute distress Skin: anicteric Head: Normocephalic and atraumatic Eyes:  sclerae anicteric, EOMI Ears: Normal auditory acuity Mouth: No deformity or lesions Neck: Supple, no masses or thyromegaly Lungs: Clear throughout to auscultation Heart: Regular rate and rhythm; no murmurs, rubs or bruits Abdomen: Soft, non tender and non distended. No masses, hepatosplenomegaly or hernias noted. Normal Bowel sounds Rectal:deferred Musculoskeletal: Symmetrical with no gross deformities  Skin: No lesions on visible extremities Pulses:  Normal pulses noted Extremities: No clubbing, cyanosis, edema or deformities noted Neurological: Alert oriented x 4, grossly nonfocal Cervical Nodes:  No significant cervical adenopathy Inguinal Nodes: No significant inguinal adenopathy Psychological:  Alert and cooperative. Normal mood and affect

## 2013-07-26 NOTE — Assessment & Plan Note (Signed)
Mild LFT abnormalities are very likely due to hepatic steatosis and possible Elita Boone.  He may have some underlying fibrosis related to alcohol consumption and abuse.  Mild low-grade alcoholic hepatitis is also a possibility.  Recommendations #1 check serologies for hepatitis B and C.

## 2013-07-26 NOTE — Assessment & Plan Note (Signed)
Plan screening colonoscopy 

## 2013-07-26 NOTE — Patient Instructions (Signed)
You have been scheduled for a colonoscopy with propofol. Please follow written instructions given to you at your visit today.  Please pick up your prep kit at the pharmacy within the next 1-3 days. If you use inhalers (even only as needed), please bring them with you on the day of your procedure. Your physician has requested that you go to www.startemmi.com and enter the access code given to you at your visit today. This web site gives a general overview about your procedure. However, you should still follow specific instructions given to you by our office regarding your preparation for the procedure.  Go to the basement for labs today   Your Suprep has been sent to your pharmacy

## 2013-07-27 DIAGNOSIS — K8020 Calculus of gallbladder without cholecystitis without obstruction: Secondary | ICD-10-CM | POA: Insufficient documentation

## 2013-07-27 NOTE — Assessment & Plan Note (Signed)
Cholelithiasis is likely an incidental finding.  The patient has no symptoms referable to this.  Plan no further therapy.

## 2013-07-27 NOTE — Progress Notes (Signed)
Quick Note:  Please inform the patient that lab work was normal and to continue current plan of action ______ 

## 2013-07-28 DIAGNOSIS — Z79899 Other long term (current) drug therapy: Secondary | ICD-10-CM | POA: Diagnosis not present

## 2013-07-28 DIAGNOSIS — G894 Chronic pain syndrome: Secondary | ICD-10-CM | POA: Diagnosis not present

## 2013-07-28 DIAGNOSIS — M5137 Other intervertebral disc degeneration, lumbosacral region: Secondary | ICD-10-CM | POA: Diagnosis not present

## 2013-07-28 DIAGNOSIS — M79609 Pain in unspecified limb: Secondary | ICD-10-CM | POA: Diagnosis not present

## 2013-07-28 DIAGNOSIS — IMO0002 Reserved for concepts with insufficient information to code with codable children: Secondary | ICD-10-CM | POA: Diagnosis not present

## 2013-08-01 ENCOUNTER — Telehealth: Payer: Self-pay | Admitting: Gastroenterology

## 2013-08-02 NOTE — Telephone Encounter (Signed)
Offered free prep sample kitwill pick up

## 2013-09-13 ENCOUNTER — Encounter: Payer: Self-pay | Admitting: Gastroenterology

## 2013-09-13 ENCOUNTER — Ambulatory Visit (AMBULATORY_SURGERY_CENTER): Payer: Medicare Other | Admitting: Gastroenterology

## 2013-09-13 VITALS — BP 116/80 | HR 67 | Temp 97.8°F | Resp 21 | Ht 68.0 in | Wt 317.0 lb

## 2013-09-13 DIAGNOSIS — Z1211 Encounter for screening for malignant neoplasm of colon: Secondary | ICD-10-CM

## 2013-09-13 DIAGNOSIS — Z6841 Body Mass Index (BMI) 40.0 and over, adult: Secondary | ICD-10-CM | POA: Diagnosis not present

## 2013-09-13 DIAGNOSIS — F341 Dysthymic disorder: Secondary | ICD-10-CM | POA: Diagnosis not present

## 2013-09-13 DIAGNOSIS — K5730 Diverticulosis of large intestine without perforation or abscess without bleeding: Secondary | ICD-10-CM

## 2013-09-13 DIAGNOSIS — K648 Other hemorrhoids: Secondary | ICD-10-CM

## 2013-09-13 DIAGNOSIS — D126 Benign neoplasm of colon, unspecified: Secondary | ICD-10-CM

## 2013-09-13 MED ORDER — SODIUM CHLORIDE 0.9 % IV SOLN: 500.0000 mL | INTRAVENOUS | Status: DC

## 2013-09-13 NOTE — Patient Instructions (Addendum)
YOU HAD AN ENDOSCOPIC PROCEDURE TODAY AT THE [facility]: Refer to the procedure report that was given to you for any specific questions about what was found during the examination.  If the procedure report does not answer your questions, please call your gastroenterologist to clarify.  If you requested that your care partner not be given the details of your procedure findings, then the procedure report has been included in a sealed envelope for you to review at your convenience later.  YOU SHOULD EXPECT: Some feelings of bloating in the abdomen. Passage of more gas than usual.  Walking can help get rid of the air that was put into your GI tract during the procedure and reduce the bloating. If you had a lower endoscopy (such as a colonoscopy or flexible sigmoidoscopy) you may notice spotting of blood in your stool or on the toilet paper. If you underwent a bowel prep for your procedure, then you may not have a normal bowel movement for a few days.  DIET: Your first meal following the procedure should be a light meal and then it is ok to progress to your normal diet.  A half-sandwich or bowl of soup is an example of a good first meal.  Heavy or fried foods are harder to digest and may make you feel nauseous or bloated.  Likewise meals heavy in dairy and vegetables can cause extra gas to form and this can also increase the bloating.  Drink plenty of fluids but you should avoid alcoholic beverages for 24 hours.  ACTIVITY: Your care partner should take you home directly after the procedure.  You should plan to take it easy, moving slowly for the rest of the day.  You can resume normal activity the day after the procedure however you should NOT DRIVE or use heavy machinery for 24 hours (because of the sedation medicines used during the test).    SYMPTOMS TO REPORT IMMEDIATELY: A gastroenterologist can be reached at any hour.  During normal business hours, 8:30 AM to 5:00 PM Monday through Friday,  call [phone].  After hours and on weekends, please call the GI answering service at [phone] who will take a message and have the physician on call contact you.   Following lower endoscopy (colonoscopy or flexible sigmoidoscopy):  Excessive amounts of blood in the stool  Significant tenderness or worsening of abdominal pains  Swelling of the abdomen that is new, acute  Fever of 100F or higher    FOLLOW UP: If any biopsies were taken you will be contacted by phone or by letter within the next 1-3 weeks.  Call your gastroenterologist if you have not heard about the biopsies in 3 weeks.  Our staff will call the home number listed on your records the next business day following your procedure to check on you and address any questions or concerns that you may have at that time regarding the information given to you following your procedure. This is a courtesy call and so if there is no answer at the home number and we have not heard from you through the emergency physician on call, we will assume that you have returned to your regular daily activities without incident.  SIGNATURES/CONFIDENTIALITY: You and/or your care partner have signed paperwork which will be entered into your electronic medical record.  These signatures attest to the fact that that the information above on your After Visit Summary has been reviewed and is understood.  Full responsibility of the confidentiality   of this discharge information lies with you and/or your care-partner.    Information on polyps,diverticulosis,hemorrhoids,high fiber diet and hemorrhoid banding given to you today

## 2013-09-13 NOTE — Op Note (Signed)
[place]  [address] [place], [address]   COLONOSCOPY PROCEDURE REPORT  PATIENT: Reakwon, Barren  MR#: [id number] BIRTHDATE: [birth year] , 52  yrs. old GENDER: Male ENDOSCOPIST: Inda Castle, MD REFERRED BY: PROCEDURE DATE:  09/13/2013 PROCEDURE:   Colonoscopy with snare polypectomy First Screening Colonoscopy - Avg.  risk and is 50 yrs.  old or older Yes.  Prior Negative Screening - Now for repeat screening. N/A  History of Adenoma - Now for follow-up colonoscopy & has been > or = to 3 yrs.  N/A  Polyps Removed Today? Yes. ASA CLASS:   Class II INDICATIONS:Average risk patient for colon cancer. MEDICATIONS: MAC sedation, administered by CRNA and propofol (Diprivan) 400mg  IV  DESCRIPTION OF PROCEDURE:   After the risks benefits and alternatives of the procedure were thoroughly explained, informed consent was obtained.  A digital rectal exam revealed no abnormalities of the rectum.   The LB [id number]  endoscope was introduced through the anus and advanced to the cecum, which was identified by both the appendix and ileocecal valve. No adverse events experienced.   The quality of the prep was Suprep good  The instrument was then slowly withdrawn as the colon was fully examined.      COLON FINDINGS: A sessile polyp measuring 7 mm in size was found in the ascending colon.  A polypectomy was performed using snare cautery.  The resection was complete and the polyp tissue was completely retrieved.   A sessile polyp measuring 4 mm in size was found in the sigmoid colon.  A polypectomy was performed with a cold snare.  The resection was complete and the polyp tissue was completely retrieved.   Moderate diverticulosis was noted in the sigmoid colon.   Internal hemorrhoids were found.  Retroflexed views revealed no abnormalities. The time to cecum=3 minutes 37 seconds.  Withdrawal time=9 minutes 27 seconds.  The scope was withdrawn and the procedure  completed. COMPLICATIONS: There were no complications.  ENDOSCOPIC IMPRESSION: 1.   Sessile polyp measuring 7 mm in size was found in the ascending colon; polypectomy was performed using snare cautery 2.   Sessile polyp measuring 4 mm in size was found in the sigmoid colon; polypectomy was performed with a cold snare 3.   Moderate diverticulosis was noted in the sigmoid colon 4.   Internal hemorrhoids  RECOMMENDATIONS: If the polyp(s) removed today are proven to be adenomatous (pre-cancerous) polyps, you will need a repeat colonoscopy in 5 years.  Otherwise you should continue to follow colorectal cancer screening guidelines for "routine risk" patients with colonoscopy in 10 years.  You will receive a letter within 1-2 weeks with the results of your biopsy as well as final recommendations.  Please call my office if you have not received a letter after 3 weeks.   eSigned:  Inda Castle, MD 09/13/2013 1:59 PM   cc: Gar Ponto, MD   PATIENT NAME:  Hillary, Schwegler MR#: [id number]

## 2013-09-13 NOTE — Progress Notes (Signed)
Called to room to assist during endoscopic procedure.  Patient ID and intended procedure confirmed with present staff. Received instructions for my participation in the procedure from the performing physician.  

## 2013-09-14 ENCOUNTER — Telehealth: Payer: Self-pay | Admitting: *Deleted

## 2013-09-14 NOTE — Telephone Encounter (Signed)
  Follow up Call-  Call back number 09/13/2013  Post procedure Call Back phone  # [phone]  Permission to leave phone message Yes     Patient questions:  Do you have a fever, pain , or abdominal swelling? no Pain Score  0 *  Have you tolerated food without any problems? no  Have you been able to return to your normal activities? yes  Do you have any questions about your discharge instructions: Diet   no Medications  no Follow up visit  no  Do you have questions or concerns about your Care? no  Actions: * If pain score is 4 or above: No action needed, pain <4.

## 2013-09-20 ENCOUNTER — Encounter: Payer: Self-pay | Admitting: Gastroenterology

## 2013-09-21 ENCOUNTER — Encounter: Payer: Self-pay | Admitting: *Deleted

## 2014-03-14 DIAGNOSIS — IMO0001 Reserved for inherently not codable concepts without codable children: Secondary | ICD-10-CM | POA: Diagnosis not present

## 2014-03-14 DIAGNOSIS — M5137 Other intervertebral disc degeneration, lumbosacral region: Secondary | ICD-10-CM | POA: Diagnosis not present

## 2014-03-14 DIAGNOSIS — M545 Low back pain, unspecified: Secondary | ICD-10-CM | POA: Diagnosis not present

## 2014-03-14 DIAGNOSIS — F411 Generalized anxiety disorder: Secondary | ICD-10-CM | POA: Diagnosis not present

## 2014-03-14 DIAGNOSIS — M51379 Other intervertebral disc degeneration, lumbosacral region without mention of lumbar back pain or lower extremity pain: Secondary | ICD-10-CM | POA: Diagnosis not present

## 2014-03-30 DIAGNOSIS — M545 Low back pain, unspecified: Secondary | ICD-10-CM | POA: Diagnosis not present

## 2014-03-30 DIAGNOSIS — M48061 Spinal stenosis, lumbar region without neurogenic claudication: Secondary | ICD-10-CM | POA: Diagnosis not present

## 2014-03-30 DIAGNOSIS — M5126 Other intervertebral disc displacement, lumbar region: Secondary | ICD-10-CM | POA: Diagnosis not present

## 2014-08-24 DIAGNOSIS — K219 Gastro-esophageal reflux disease without esophagitis: Secondary | ICD-10-CM | POA: Diagnosis not present

## 2014-08-24 DIAGNOSIS — F419 Anxiety disorder, unspecified: Secondary | ICD-10-CM | POA: Diagnosis not present

## 2014-08-24 DIAGNOSIS — M545 Low back pain: Secondary | ICD-10-CM | POA: Diagnosis not present

## 2014-08-24 DIAGNOSIS — I10 Essential (primary) hypertension: Secondary | ICD-10-CM | POA: Diagnosis not present

## 2014-08-24 DIAGNOSIS — E1165 Type 2 diabetes mellitus with hyperglycemia: Secondary | ICD-10-CM | POA: Diagnosis not present

## 2014-08-24 DIAGNOSIS — M542 Cervicalgia: Secondary | ICD-10-CM | POA: Diagnosis not present

## 2014-09-08 DIAGNOSIS — R935 Abnormal findings on diagnostic imaging of other abdominal regions, including retroperitoneum: Secondary | ICD-10-CM | POA: Diagnosis not present

## 2014-09-08 DIAGNOSIS — R1013 Epigastric pain: Secondary | ICD-10-CM | POA: Diagnosis not present

## 2014-09-08 DIAGNOSIS — R945 Abnormal results of liver function studies: Secondary | ICD-10-CM | POA: Diagnosis not present

## 2014-09-23 DIAGNOSIS — M545 Low back pain: Secondary | ICD-10-CM | POA: Diagnosis not present

## 2014-09-23 DIAGNOSIS — K219 Gastro-esophageal reflux disease without esophagitis: Secondary | ICD-10-CM | POA: Diagnosis not present

## 2014-09-23 DIAGNOSIS — J309 Allergic rhinitis, unspecified: Secondary | ICD-10-CM | POA: Diagnosis not present

## 2014-09-23 DIAGNOSIS — I10 Essential (primary) hypertension: Secondary | ICD-10-CM | POA: Diagnosis not present

## 2014-09-23 DIAGNOSIS — E1165 Type 2 diabetes mellitus with hyperglycemia: Secondary | ICD-10-CM | POA: Diagnosis not present

## 2014-09-23 DIAGNOSIS — F419 Anxiety disorder, unspecified: Secondary | ICD-10-CM | POA: Diagnosis not present

## 2015-04-04 DIAGNOSIS — K219 Gastro-esophageal reflux disease without esophagitis: Secondary | ICD-10-CM | POA: Diagnosis not present

## 2015-04-04 DIAGNOSIS — I10 Essential (primary) hypertension: Secondary | ICD-10-CM | POA: Diagnosis not present

## 2015-04-04 DIAGNOSIS — E1165 Type 2 diabetes mellitus with hyperglycemia: Secondary | ICD-10-CM | POA: Diagnosis not present

## 2015-06-06 DIAGNOSIS — N400 Enlarged prostate without lower urinary tract symptoms: Secondary | ICD-10-CM | POA: Diagnosis not present

## 2015-06-06 DIAGNOSIS — J309 Allergic rhinitis, unspecified: Secondary | ICD-10-CM | POA: Diagnosis not present

## 2015-06-06 DIAGNOSIS — K219 Gastro-esophageal reflux disease without esophagitis: Secondary | ICD-10-CM | POA: Diagnosis not present

## 2015-06-06 DIAGNOSIS — E1165 Type 2 diabetes mellitus with hyperglycemia: Secondary | ICD-10-CM | POA: Diagnosis not present

## 2015-06-06 DIAGNOSIS — M545 Low back pain: Secondary | ICD-10-CM | POA: Diagnosis not present

## 2015-06-06 DIAGNOSIS — I10 Essential (primary) hypertension: Secondary | ICD-10-CM | POA: Diagnosis not present

## 2015-06-06 DIAGNOSIS — F419 Anxiety disorder, unspecified: Secondary | ICD-10-CM | POA: Diagnosis not present

## 2015-08-31 DIAGNOSIS — F419 Anxiety disorder, unspecified: Secondary | ICD-10-CM | POA: Diagnosis not present

## 2015-08-31 DIAGNOSIS — K219 Gastro-esophageal reflux disease without esophagitis: Secondary | ICD-10-CM | POA: Diagnosis not present

## 2015-08-31 DIAGNOSIS — I10 Essential (primary) hypertension: Secondary | ICD-10-CM | POA: Diagnosis not present

## 2015-08-31 DIAGNOSIS — E1165 Type 2 diabetes mellitus with hyperglycemia: Secondary | ICD-10-CM | POA: Diagnosis not present

## 2015-09-07 DIAGNOSIS — E1165 Type 2 diabetes mellitus with hyperglycemia: Secondary | ICD-10-CM | POA: Diagnosis not present

## 2015-09-07 DIAGNOSIS — F419 Anxiety disorder, unspecified: Secondary | ICD-10-CM | POA: Diagnosis not present

## 2015-09-07 DIAGNOSIS — K219 Gastro-esophageal reflux disease without esophagitis: Secondary | ICD-10-CM | POA: Diagnosis not present

## 2015-09-07 DIAGNOSIS — I10 Essential (primary) hypertension: Secondary | ICD-10-CM | POA: Diagnosis not present

## 2015-09-07 DIAGNOSIS — M545 Low back pain: Secondary | ICD-10-CM | POA: Diagnosis not present

## 2015-12-17 DIAGNOSIS — K219 Gastro-esophageal reflux disease without esophagitis: Secondary | ICD-10-CM | POA: Diagnosis not present

## 2015-12-17 DIAGNOSIS — E1165 Type 2 diabetes mellitus with hyperglycemia: Secondary | ICD-10-CM | POA: Diagnosis not present

## 2015-12-17 DIAGNOSIS — I10 Essential (primary) hypertension: Secondary | ICD-10-CM | POA: Diagnosis not present

## 2015-12-17 DIAGNOSIS — F419 Anxiety disorder, unspecified: Secondary | ICD-10-CM | POA: Diagnosis not present

## 2015-12-20 DIAGNOSIS — M545 Low back pain: Secondary | ICD-10-CM | POA: Diagnosis not present

## 2015-12-20 DIAGNOSIS — F419 Anxiety disorder, unspecified: Secondary | ICD-10-CM | POA: Diagnosis not present

## 2015-12-20 DIAGNOSIS — I10 Essential (primary) hypertension: Secondary | ICD-10-CM | POA: Diagnosis not present

## 2015-12-20 DIAGNOSIS — R079 Chest pain, unspecified: Secondary | ICD-10-CM | POA: Diagnosis not present

## 2015-12-20 DIAGNOSIS — K219 Gastro-esophageal reflux disease without esophagitis: Secondary | ICD-10-CM | POA: Diagnosis not present

## 2015-12-20 DIAGNOSIS — E1165 Type 2 diabetes mellitus with hyperglycemia: Secondary | ICD-10-CM | POA: Diagnosis not present

## 2015-12-24 DIAGNOSIS — I10 Essential (primary) hypertension: Secondary | ICD-10-CM | POA: Diagnosis not present

## 2015-12-24 DIAGNOSIS — E119 Type 2 diabetes mellitus without complications: Secondary | ICD-10-CM | POA: Diagnosis not present

## 2015-12-24 DIAGNOSIS — R079 Chest pain, unspecified: Secondary | ICD-10-CM | POA: Diagnosis not present

## 2016-03-03 ENCOUNTER — Emergency Department: Admit: 2016-03-04

## 2016-03-03 ENCOUNTER — Inpatient Hospital Stay

## 2016-03-03 DIAGNOSIS — J939 Pneumothorax, unspecified: Secondary | ICD-10-CM

## 2016-03-03 NOTE — ED Notes (Signed)
Dr.Price to bedside to perform chest tube insertion     Rosita FireKristin Lee Emanual Lamountain, RN  03/03/16 2244

## 2016-03-03 NOTE — ED Notes (Signed)
Pt back from xray and CT     Letta Patenna L Misins, RN  03/03/16 2214

## 2016-03-03 NOTE — ED Notes (Addendum)
Dr. Samuella CotaPrice at bedside to eval. MD placing orders. Pt on monitoring and cont pulse ox. Positioned for comfort for R hip pain. Pt prefers friend not to come back at this time. Friend in lobby.    No c-collar and no spinal precautions at this time per MD.     Kurtis BushmanKaitlin Marie Celine Dishman, RN  03/03/16 2030

## 2016-03-03 NOTE — ED Provider Notes (Addendum)
[facility]  ED  eMERGENCY dEPARTMENT eNCOUnter        Pt Name: Brandon Terrell  MRN: [id number]  Birthdate 23-Mar-1961  Date of evaluation: 03/03/2016  Provider: Geralynn Rile, MD  PCP: No primary care provider on file.      CHIEF COMPLAINT       Chief Complaint   Patient presents with   ??? Trauma     fell 16 ft off roof, R hip and R leg pain       HISTORY OF PRESENT ILLNESS   (Location/Symptom, Timing/Onset, Context/Setting, Quality, Duration, Modifying Factors, Severity)  Note limiting factors.     Brandon Terrell is a 55 y.o. male presenting today after trying to pain roughly 16 feet up in the air when his ladder slipped and he fell onto his right hip only.  He denies hitting his head and did not lose consciousness.  He denies any neck pain.  He does have significant right hip pain but initially denied any chest pain to me and denied any shortness of breath.  He denies any numbness or weakness in the arms or legs other than significant pain to his right hip.  No nausea or vomiting.  He has been unable to walk since the fall.  His tetanus is up-to-date and he last ate around noon.  He came by personal vehicle.  He denies any back pain other than on the right side.  Nothing is currently making his pain better.     Initially, when I saw him he actually denied any chest pain and was just complaining of the right hip pain, but due to the fall I did obtain a pan-scan.  After I told him after the PTX on the right, that is when he actually admitted to chest pain but again no shortness of breath, which is the reason a chest x-ray was not initially obtained.      Nursing Notes were all reviewed and agreed with or any disagreements were addressed  in the HPI.    REVIEW OF SYSTEMS    (2-9 systems for level 4, 10 or more for level 5)     Review of Systems   Constitutional: Negative for chills and fever.   HENT: Negative for congestion.    Eyes: Negative for visual disturbance.   Respiratory: Negative for  chest tightness and shortness of breath.    Cardiovascular: Positive for chest pain (on right sided told to me later ).   Genitourinary: Negative for decreased urine volume.   Musculoskeletal: Positive for back pain (right sided) and gait problem. Negative for neck pain and neck stiffness.   Skin: Negative for color change.   Neurological: Negative for dizziness, weakness, light-headedness, numbness and headaches.   Psychiatric/Behavioral: Negative for confusion.         Positives and Pertinent negatives as per HPI.  Except as noted above in the ROS, all other systems were reviewed and negative.       PAST MEDICAL HISTORY   History reviewed. No pertinent past medical history.      SURGICAL HISTORY       Past Surgical History:   Procedure Laterality Date   ??? ANTERIOR CRUCIATE LIGAMENT REPAIR      right         CURRENT MEDICATIONS       There are no discharge medications for this patient.      ALLERGIES     Review of patient's allergies indicates no  known allergies.    FAMILY HISTORY     History reviewed. No pertinent family history.       SOCIAL HISTORY       Social History     Social History   ??? Marital status: Divorced     Spouse name: N/A   ??? Number of children: N/A   ??? Years of education: N/A     Social History Main Topics   ??? Smoking status: Current Some Day Smoker     Types: Cigars   ??? Smokeless tobacco: None   ??? Alcohol use 1.2 - 2.4 oz/week     2 - 4 Cans of beer per week      Comment: monthly   ??? Drug use: Yes     Special: Marijuana   ??? Sexual activity: Not Asked     Other Topics Concern   ??? None     Social History Narrative       SCREENINGS    Glasgow Coma Scale  Eye Opening: Spontaneous  Best Verbal Response: Oriented  Best Motor Response: Obeys commands  Glasgow Coma Scale Score: 15        PHYSICAL EXAM    (up to 7 for level 4, 8 or more for level 5)   ED Triage Vitals   BP Temp Temp Source Pulse Resp SpO2 Height Weight   03/03/16 2024 03/03/16 2024 03/03/16 2024 03/03/16 2024 03/03/16 2024 03/03/16 2024  03/03/16 2024 03/03/16 2024   108/79 98 ??F (36.7 ??C) Oral 89 16 95 % 5\' 8"  (1.727 m) 180 lb (81.6 kg)       Physical Exam   Constitutional: He is oriented to person, place, and time. He appears well-developed and well-nourished. He is active and cooperative.  Non-toxic appearance. He does not have a sickly appearance. He does not appear ill. He appears distressed (moderate).   HENT:   Head: Normocephalic and atraumatic. Not macrocephalic and not microcephalic. Head is without raccoon's eyes, without Battle's sign, without abrasion, without contusion, without laceration, without right periorbital erythema and without left periorbital erythema. Hair is normal.   Right Ear: External ear normal.   Left Ear: External ear normal.   Nose: Nose normal.   Mouth/Throat: Oropharynx is clear and moist and mucous membranes are normal. No oropharyngeal exudate.   Eyes: EOM are normal. Pupils are equal, round, and reactive to light. Right eye exhibits no discharge. Left eye exhibits no discharge. No scleral icterus.   Neck: Trachea normal, normal range of motion and full passive range of motion without pain. Neck supple. No spinous process tenderness and no muscular tenderness present. No rigidity. No tracheal deviation, no edema, no erythema and normal range of motion present.   Cardiovascular: Normal rate, regular rhythm, intact distal pulses and normal pulses.  Exam reveals no gallop and no friction rub.    Pulmonary/Chest: Effort normal and breath sounds normal. No accessory muscle usage or stridor. No tachypnea and no bradypnea. No respiratory distress. He has no decreased breath sounds. He has no wheezes. He has no rhonchi. He has no rales. He exhibits tenderness (right, but initially did not mention pain on exam ).   Abdominal: Soft. Bowel sounds are normal. He exhibits no distension. There is tenderness in the right lower quadrant. There is no rigidity, no rebound, no guarding, no CVA tenderness, no tenderness at  McBurney's point and negative Murphy's sign.   Musculoskeletal: He exhibits tenderness (right hip). He exhibits no edema or deformity.  Right shoulder: He exhibits normal range of motion, no tenderness, no bony tenderness and no deformity.        Left shoulder: He exhibits normal range of motion, no tenderness, no bony tenderness and no deformity.        Right elbow: He exhibits normal range of motion. No tenderness found.        Left elbow: He exhibits normal range of motion. No tenderness found.        Right wrist: He exhibits normal range of motion, no tenderness and no bony tenderness.        Left wrist: He exhibits normal range of motion, no tenderness and no bony tenderness.        Right hip: He exhibits decreased range of motion, tenderness, bony tenderness and swelling. He exhibits no deformity and no laceration.        Left hip: He exhibits normal range of motion, normal strength, no tenderness and no bony tenderness.        Right knee: He exhibits normal range of motion. No tenderness found.        Left knee: He exhibits normal range of motion. No tenderness found.        Right ankle: He exhibits normal range of motion. No tenderness.        Left ankle: He exhibits normal range of motion. No tenderness.        Cervical back: He exhibits normal range of motion, no tenderness, no bony tenderness and no swelling.        Thoracic back: He exhibits normal range of motion, no tenderness, no bony tenderness and no swelling.        Lumbar back: He exhibits tenderness and bony tenderness (right lower back). He exhibits normal range of motion.   Neurological: He is alert and oriented to person, place, and time. He has normal strength. He is not disoriented. He displays no atrophy and no tremor. No cranial nerve deficit or sensory deficit. He exhibits normal muscle tone. He displays no seizure activity. GCS eye subscore is 4. GCS verbal subscore is 5. GCS motor subscore is 6.   Skin: Skin is warm and dry. No  rash noted. He is not diaphoretic. No erythema. No pallor.   Psychiatric: He has a normal mood and affect.   Nursing note and vitals reviewed.          DIAGNOSTIC RESULTS   LABS:    Labs Reviewed   CBC - Abnormal; Notable for the following:        Result Value    WBC 25.5 (*)     All other components within normal limits   COMPREHENSIVE METABOLIC PANEL - Abnormal; Notable for the following:     Glucose 205 (*)     BUN 21 (*)     All other components within normal limits   CBC WITH AUTO DIFFERENTIAL - Abnormal; Notable for the following:     WBC 26.8 (*)     RBC 3.75 (*)     Hemoglobin 11.0 (*)     Hematocrit 33.3 (*)     Neutrophils # 24.7 (*)     Lymphocytes # 0.6 (*)     Monocytes # 1.5 (*)     All other components within normal limits   LIPASE   SAMPLE POSSIBLE BLOOD BANK TESTING   HIV RAPID 1&2   URINALYSIS   HEPATITIS PANEL, ACUTE       All other labs were within  normal range or not returned as of this dictation.    EKG: All EKG's are interpreted by the Emergency Department Physician who either signs or Co-signs this chart in the absence of a cardiologist.        RADIOLOGY:   Non-plain film images such as CT, Ultrasound and MRI are read by the radiologist. Plain radiographic images are visualized and preliminarily interpreted by the  ED Provider with the below findings:    X-ray reviewed by me after chest tube insertion and showed slight kink in the tube but re-expanded PTX    Interpretation per the Radiologist below, if available at the time of this note:    XR Chest Portable   Final Result   Right-sided chest tube placement.  Pneumothorax seen on the right appears to   have been evacuated.         CT Chest Abdomen Pelvis W Contrast   Final Result   1. Complex right pelvic fracture, with extensive surrounding hemorrhage, and   with evidence of active extravasation into the pelvis.   2. Right pneumothorax, presumably secondary to the right anterolateral 5th   rib fracture.  There is a background of interstitial  lung disease.   Findings were discussed with Raynelle Highland [place]ola Holian at 7:30 pm on 03/03/2016.         CT Cervical Spine WO Contrast   Final Result   No acute abnormality of the cervical spine.      Please see chest CT findings for further details regarding the right-sided   pneumothorax.         CT Head WO Contrast   Final Result   No acute intracranial abnormality.               PROCEDURES   Unless otherwise noted below, none     Chest Tube  Date/Time: 03/04/2016 10:35 AM  Performed by: Geralynn Rile  Authorized by: Geralynn Rile     Consent:     Consent obtained:  Written    Consent given by:  Patient    Risks discussed:  Bleeding, incomplete drainage, nerve damage, damage to surrounding structures, infection and pain    Alternatives discussed:  No treatment and alternative treatment  Pre-procedure details:     Skin preparation:  Betadine and ChloraPrep    Preparation: Patient was prepped and draped in the usual sterile fashion    Sedation:     Sedation type:  Deep  Anesthesia (see MAR for exact dosages):     Anesthesia method:  Local infiltration    Local anesthetic:  Lidocaine 1% w/o epi  Procedure details:     Placement location:  R anterior    Scalpel size:  10    Tube size (Fr):  36    Dissection instrument:  Finger and Kelly clamp    Ultrasound guidance: no      Tension pneumothorax: no      Tube connected to:  Suction    Drainage characteristics:  Air only    Suture material:  0 silk    Dressing:  4x4 sterile gauze and petrolatum-impregnated gauze  Post-procedure details:     Post-insertion x-ray findings: tube in good position      Patient tolerance of procedure:  Tolerated well, no immediate complications  Comments:      Chest tube went in with minor difficulty since initial hole was too small, but after slightly widening the hole, it went it without difficulty.  Good rush  of air was heard when I initially entered the chest cavity with my Kelly clamp.  Following placement and suturing, I did notice that my  left index finger sterile glove had a small hole in it and therefore, I was exposed to blood.  I did use alcohol on my hands prior to procedure and wash my hands frequently while in the emergency department in between rooms.  I did tell the patient this and he consented verbally to allow his blood drawn for HIV and hepatis panel but denies having any of these.  I also informed him I have never had any of these blood borne pathogens, as well.  I submitted a [employer] report related to this.  Otherwise, tube was working well and I am unsure exactly when I had the slight tear in my glove.  I only had a very small area of skin on the left index finger near the lateral nail fold where a small piece of nail cuticle had been removed earlier in the day less than 1 mm in area that had a small scab, but no bleeding or obvious open wound, but just to be on the safe side for me and the patient, I did report this event.        PROCEDURE:  PROCEDURAL SEDATION    Pre-sedation Assessment    Intended Procedure: Right tube thoracostomy    Pre-Procedure Assessment/Plan:  Airway:Mallampati class II (soft palate, uvula, fauces visible)  ASA 1 - Normal health patient    Level of Sedation Plan:Deep sedation    Post Procedure plan: Return to same level of care    I assessed the patient and find that the patient is in satisfactory condition to proceed with the planned procedure and sedation plan.    The benefits, risks, and alternatives of procedural sedation were discussed with Juanito Doom or their surrogate. We discussed the potential side effects or adverse reactions that could occur with ketamine. An opportunity to ask questions was provided, and consent was given for the procedure. Oxygen was administered, and the appropriate pre-procedural policies were followed. Hospital protocol for cardiac, oxygen saturation, and blood pressure monitoring occurred. For details of the dosage administered, see the procedural sedation documentation.  Juanito Doom tolerated the medication and procedure without significant complications other than brief desaturation to 81% that corrected quickly with jaw thrust maneuver to him satting at 100% again. Juanito Doom woke up without difficulty, and was sent to UC due to having significant traumatic findings.  Intra-service time of 20 minutes.    CRITICAL CARE TIME   Time: 35 minutes   Includes repeat examinations, speaking with consultants, lab and x-ray interpretation, speaking with family   Excludes separate billable procedures.  Patient at risk for serious decompensation if not treated for this life-threatening condition.          CONSULTS: Spoke with Dr. Brynda Greathouse at 2330 with trauma team who accepted the patient and then I spoke with ED attending who accepted the transfer.    IP CONSULT TO HOSPITALIST    EMERGENCY DEPARTMENT COURSE and DIFFERENTIAL DIAGNOSIS/MDM:   Vitals:    Vitals:    03/04/16 0013 03/04/16 0018 03/04/16 0023 03/04/16 0049   BP: (!) 133/93 120/88 122/87 122/87   Pulse: 76 72 80 80   Resp: Temp:    98.6 ??F (37 ??C)   TempSrc:    Oral   SpO2: 100%  99% 100%   Weight:  Height:           Patient was given the following medications:  Medications   0.9 % sodium chloride bolus (0 mLs Intravenous Stopped 03/03/16 2137)   morphine (PF) injection 4 mg (4 mg Intravenous Given 03/03/16 2036)   diazepam (VALIUM) injection 5 mg (5 mg Intravenous Given 03/03/16 2036)   ondansetron (ZOFRAN) injection 4 mg (4 mg Intravenous Given 03/03/16 2036)   iopamidol (ISOVUE-370) 76 % injection 100 mL (100 mLs Intravenous Given 03/03/16 2156)   diazepam (VALIUM) injection 5 mg (5 mg Intravenous Given 03/03/16 2128)   morphine (PF) injection 4 mg (4 mg Intravenous Given 03/03/16 2128)   ketamine (KETALAR) injection 60 mg (60 mg Intravenous Given 03/03/16 2249)   0.9 % sodium chloride bolus (0 mLs Intravenous Stopped 03/04/16 0023)   ketamine (KETALAR) injection 40 mg (40 mg Intravenous Given 03/03/16 2255)    fentaNYL (SUBLIMAZE) injection 75 mcg (75 mcg Intravenous Given 03/03/16 2258)   ketamine (KETALAR) injection 20 mg (20 mg Intravenous Given 03/04/16 0007)   0.9 % sodium chloride bolus (0 mLs Intravenous Stopped 03/04/16 0101)   fentaNYL (SUBLIMAZE) injection 50 mcg (50 mcg Intravenous Given 03/04/16 0040)     Patient was initially evaluated due to concern for only complaining of right hip pain.  However due to the fall, we did obtain a pan scan which was negative for any acute findings in the head or C-spine regards to bleeding or fracture, but pneumothorax was found along with rib fracture and concern for pelvic fracture with some active extravasation.  Therefore I did obtain consent from the patient to do a right tube thoracostomy under procedural sedation.  He understood the risks of procedural sedation being desaturation, with possible helping with breathing or intubation along with allergic reaction to medication.  He understood the risks with chest tube being infection, bleeding, nerve damage, significant pain, lung injury but understands this is needed emergently due to concern for large pneumothorax that could turn into a tension pneumothorax.  Please see my note in regards to the procedural sedation and chest tube.  He did have an episode where he became diaphoretic with lowering blood pressure and therefore we did give him an additional liter of saline, but following this his diaphoresis improved.  He also had improvement with his sweating following readministration of ketamine 20 mg and then prior to transfer by EMS was given 50 ??g of fentanyl.  His blood pressure did well following the liter bolus and [facility] but at this time his vitals are stable and no need for emergent blood at this time.  He was informed about the seriousness of his injuries including the rib fracture along with the multiple pelvic fractures and need for possible surgery.  His  condition upon transfer was critical and guarded at this time.      The patient tolerated their visit well.   The patient and / or the family were informed of the results of any tests, a time was given to answer questions.    FINAL IMPRESSION      1. Pneumothorax, right    2. Closed fracture of one rib of right side, initial encounter    3. Closed displaced fracture of anterior column of right acetabulum, initial encounter (HCC)          DISPOSITION/PLAN   DISPOSITION Decision to Transfer    PATIENT REFERRED TO:  No follow-up provider specified.    DISCHARGE  MEDICATIONS:  There are no discharge medications for this patient.      DISCONTINUED MEDICATIONS:  There are no discharge medications for this patient.             (Please note that portions of this note were completed with a voice recognition program.  Efforts were made to edit the dictations but occasionally words are mis-transcribed.)    Geralynn Rile, MD (electronically signed)            Geralynn Rile, MD  03/04/16 1059       Geralynn Rile, MD  03/04/16 1134

## 2016-03-03 NOTE — ED Notes (Signed)
Pt moved to room 1.      Delos Haringnna L Misins, RN  03/03/16 2215

## 2016-03-03 NOTE — ED Notes (Signed)
[facility] Consent signed for chest tube insertion. Placed in chart.      Rosita FireKristin Lee Aaleyah Witherow, RN  03/03/16 2228

## 2016-03-03 NOTE — ED Notes (Signed)
Pt off floor for CT      Letta Patenna L Misins, RN  03/03/16 2155

## 2016-03-03 NOTE — Consults (Signed)
UC @ 2320  ZO:XWRURE:need [facility] ED for right ptx and pelvic fracture  Dr. Orie FishermanPetro @ 2329

## 2016-03-03 NOTE — Progress Notes (Signed)
Pt attached to EtCo2 monitoring. Pt manually bagged on 100%, 15L, with ambu bag during procedure. O2 sat remained at 100%during procedure. Pt on 3LNC (with EtCO2 monitoring) post procedure. No respiratory distress. No complications.

## 2016-03-03 NOTE — ED Notes (Signed)
Chest tube insertion placed by Dr.Price. X ray to bedside to confirm placement. Atrium set up at 80 continuous suction. Pt resting comfortably, in no acute distress. Will continue to monitor.      Rosita FireKristin Lee Jaline Pincock, RN  03/03/16 [phone]

## 2016-03-03 NOTE — ED Notes (Signed)
IV Morphine and Valium admin per order.  Pt asleep immediately.  Placed 2L O2 on pt.  Will monitor.      Delos Haringnna L Misins, RN  03/03/16 2051

## 2016-03-03 NOTE — ED Notes (Signed)
MD at bedside. Pt needs moved to room 1 d/t  R lung pneumo      Letta Patenna L Misins, RN  03/03/16 2215

## 2016-03-03 NOTE — Progress Notes (Signed)
UC @ 2320  RE:need UC ED for right ptx and pelvic fracture  Dr. Petro @ 2329

## 2016-03-04 ENCOUNTER — Inpatient Hospital Stay
Admit: 2016-03-04 | Discharge: 2016-03-04 | Disposition: A | Discharge status: Other Acute Inpatient Hospital | Attending: Emergency Medicine

## 2016-03-04 ENCOUNTER — Inpatient Hospital Stay: Admit: 2016-03-04 | Payer: PRIVATE HEALTH INSURANCE

## 2016-03-04 ENCOUNTER — Inpatient Hospital Stay
Admit: 2016-03-04 | Discharge: 2016-03-07 | Disposition: A | Discharge status: Home Health | Payer: PRIVATE HEALTH INSURANCE

## 2016-03-04 ENCOUNTER — Encounter: Admit: 2016-03-04

## 2016-03-04 ENCOUNTER — Emergency Department: Admit: 2016-03-04 | Payer: PRIVATE HEALTH INSURANCE

## 2016-03-04 DIAGNOSIS — S32810A Multiple fractures of pelvis with stable disruption of pelvic ring, initial encounter for closed fracture: Principal | ICD-10-CM

## 2016-03-04 LAB — CBC
Hematocrit: 42.2 % (ref 40.5–52.5)
Hematocrit: 33 % (ref 38.5–50.0)
Hematocrit: 26.4 % (ref 38.5–50.0)
Hemoglobin: 14.1 g/dL (ref 13.5–17.5)
Hemoglobin: 10.8 g/dL (ref 13.2–17.1)
Hemoglobin: 8.9 g/dL (ref 13.2–17.1)
MCH: 29.7 pg (ref 26.0–34.0)
MCH: 29 pg (ref 27.0–33.0)
MCH: 29.9 pg (ref 27.0–33.0)
MCHC: 33.5 g/dL (ref 31.0–36.0)
MCHC: 32.6 g/dL (ref 32.0–36.0)
MCHC: 33.8 g/dL (ref 32.0–36.0)
MCV: 88.9 fL (ref 80.0–100.0)
MCV: 88.9 fL (ref 80.0–100.0)
MCV: 88.4 fL (ref 80.0–100.0)
MPV: 7.5 fL (ref 5.0–10.5)
MPV: 7.5 fL (ref 7.5–11.5)
MPV: 7.2 fL (ref 7.5–11.5)
Platelets: 376 10*3/uL (ref 135–450)
Platelets: 278 10*3/uL (ref 140–400)
Platelets: 239 10*3/uL (ref 140–400)
RBC: 4.75 M/uL (ref 4.20–5.90)
RBC: 3.71 10*6/uL (ref 4.20–5.80)
RBC: 2.99 10*6/uL (ref 4.20–5.80)
RDW: 14.1 % (ref 12.4–15.4)
RDW: 14.1 % (ref 11.0–15.0)
RDW: 14.2 % (ref 11.0–15.0)
WBC: 25.5 10*3/uL — ABNORMAL HIGH (ref 4.0–11.0)
WBC: 24.6 10*3/uL (ref 3.8–10.8)
WBC: 15.7 10*3/uL (ref 3.8–10.8)

## 2016-03-04 LAB — DIFFERENTIAL
Basophils Absolute: 25 /uL (ref 0–200)
Basophils Relative: 0.1 % (ref 0.0–1.0)
Eosinophils Absolute: 0 /uL (ref 15–500)
Eosinophils Relative: 0 % (ref 0.0–8.0)
Lymphocytes Absolute: 443 /uL (ref 850–3900)
Lymphocytes Relative: 1.8 % (ref 15.0–45.0)
Monocytes Absolute: 1402 /uL (ref 200–950)
Monocytes Relative: 5.7 % (ref 0.0–12.0)
Neutrophils Absolute: 22730 /uL (ref 1500–7800)
Neutrophils Relative: 92.4 % (ref 40.0–80.0)
nRBC: 0 /100 WBC (ref 0–0)

## 2016-03-04 LAB — PROTIME-INR
INR: 1.1 (ref 0.9–1.1)
Protime: 14.6 seconds (ref 11.6–14.4)

## 2016-03-04 LAB — VENOUS BLOOD GAS, LINE/SYRINGE
%HBO2-Line Draw: 52.8 % (ref 40.0–70.0)
Base Excess-Line Draw: -2.3 mmol/L (ref <=3.0)
CO2 Content-Line Draw: 26 mmol/L (ref 25–29)
Carboxyhgb-Line Draw: 1.5 % (ref 0.0–2.0)
HCO3-Line Draw: 25 mmol/L (ref 24–28)
Methemoglobin-Line Draw: 0.3 % (ref 0.0–1.5)
PCO2-Line Draw: 55 mm Hg (ref 41–51)
PH-Line Draw: 7.26 (ref 7.32–7.42)
PO2-Line Draw: 32 mm Hg (ref 25–40)
Reduced Hemoglobin-Line Draw: 45.4 % (ref 0.0–5.0)

## 2016-03-04 LAB — ABO/RH: Rh Type: NEGATIVE

## 2016-03-04 LAB — BASIC METABOLIC PANEL
Anion Gap: 6 mmol/L (ref 3–16)
BUN: 18 mg/dL (ref 7–25)
CO2: 25 mmol/L (ref 21–33)
Calcium: 8.1 mg/dL (ref 8.6–10.3)
Chloride: 111 mmol/L (ref 98–110)
Creatinine: 0.95 mg/dL (ref 0.60–1.30)
Glucose: 169 mg/dL (ref 70–100)
Osmolality, Calculated: 300 mOsm/kg (ref 278–305)
Potassium: 5 mmol/L (ref 3.5–5.3)
Sodium: 142 mmol/L (ref 133–146)
eGFR AA CKD-EPI: >90 See note.
eGFR NONAA CKD-EPI: >90 See note.

## 2016-03-04 LAB — HEPATIC FUNCTION PANEL
ALT: 15 U/L (ref 7–52)
AST: 34 U/L (ref 13–39)
Albumin: 3.4 g/dL (ref 3.5–5.7)
Alkaline Phosphatase: 43 U/L (ref 36–125)
Bilirubin, Direct: 0.1 mg/dL (ref 0.0–0.4)
Bilirubin, Indirect: 0.2 mg/dL (ref 0.0–1.1)
Total Bilirubin: 0.3 mg/dL (ref 0.0–1.5)
Total Protein: 5.7 g/dL (ref 6.4–8.9)

## 2016-03-04 LAB — LACTIC ACID, VENOUS, WHOLE BLOOD, UCMC: Lactate, Ven: 1.9 mmol/L (ref 0.5–2.2)

## 2016-03-04 LAB — ANTIBODY SCREEN: Antibody Screen: NEGATIVE

## 2016-03-04 LAB — LIPASE: Lipase: 49 U/L (ref 13.0–60.0)

## 2016-03-04 LAB — COMPREHENSIVE METABOLIC PANEL
ALT: 18 U/L (ref 10–40)
AST: 31 U/L (ref 15–37)
Albumin/Globulin Ratio: 1.4 (ref 1.1–2.2)
Albumin: 4.5 g/dL (ref 3.4–5.0)
Alkaline Phosphatase: 67 U/L (ref 40–129)
Anion Gap: 15 (ref 3–16)
BUN: 21 mg/dL — ABNORMAL HIGH (ref 7–20)
CO2: 25 mmol/L (ref 21–32)
Calcium: 9.3 mg/dL (ref 8.3–10.6)
Chloride: 99 mmol/L (ref 99–110)
Creatinine: 1 mg/dL (ref 0.9–1.3)
GFR African American: >60 (ref >60)
GFR Non-African American: >60 (ref >60)
Globulin: 3.2 g/dL
Glucose: 205 mg/dL — ABNORMAL HIGH (ref 70–99)
Potassium: 3.5 mmol/L (ref 3.5–5.1)
Sodium: 139 mmol/L (ref 136–145)
Total Bilirubin: <0.2 mg/dL (ref 0.0–1.0)
Total Protein: 7.7 g/dL (ref 6.4–8.2)

## 2016-03-04 LAB — CBC WITH AUTO DIFFERENTIAL
Basophils %: 0.3 %
Basophils Absolute: 0.1 10*3/uL (ref 0.0–0.2)
Eosinophils %: 0 %
Eosinophils Absolute: 0 10*3/uL (ref 0.0–0.6)
Hematocrit: 33.3 % — ABNORMAL LOW (ref 40.5–52.5)
Hemoglobin: 11 g/dL — ABNORMAL LOW (ref 13.5–17.5)
Lymphocytes %: 2.2 %
Lymphocytes Absolute: 0.6 10*3/uL — ABNORMAL LOW (ref 1.0–5.1)
MCH: 29.2 pg (ref 26.0–34.0)
MCHC: 32.9 g/dL (ref 31.0–36.0)
MCV: 88.8 fL (ref 80.0–100.0)
MPV: 7.4 fL (ref 5.0–10.5)
Monocytes %: 5.6 %
Monocytes Absolute: 1.5 10*3/uL — ABNORMAL HIGH (ref 0.0–1.3)
Neutrophils %: 91.9 %
Neutrophils Absolute: 24.7 10*3/uL — ABNORMAL HIGH (ref 1.7–7.7)
Platelets: 256 10*3/uL (ref 135–450)
RBC: 3.75 M/uL — ABNORMAL LOW (ref 4.20–5.90)
RDW: 14.1 % (ref 12.4–15.4)
WBC: 26.8 10*3/uL — ABNORMAL HIGH (ref 4.0–11.0)

## 2016-03-04 LAB — HEPATITIS PANEL, ACUTE
Hep A IgM: NONREACTIVE
Hep B Core Ab, IgM: NONREACTIVE
Hep B S Ag Interp: NONREACTIVE
Hep C Ab Interp: NONREACTIVE

## 2016-03-04 LAB — HIV RAPID 1&2: Rapid HIV 1&2: NONREACTIVE

## 2016-03-04 LAB — SAMPLE POSSIBLE BLOOD BANK TESTING

## 2016-03-04 MED ORDER — SODIUM CHLORIDE 0.9 % IV BOLUS
0.9 % | Freq: Once | INTRAVENOUS | Status: AC
Start: 2016-03-04 — End: 2016-03-03
  Administered 2016-03-04: 01:00:00 1000 mL via INTRAVENOUS

## 2016-03-04 MED ORDER — KETAMINE HCL 50 MG/ML IJ SOLN
50 MG/ML | Freq: Once | INTRAMUSCULAR | Status: DC
Start: 2016-03-04 — End: 2016-03-04

## 2016-03-04 MED ORDER — MORPHINE SULFATE (PF) 4 MG/ML IV SOLN
4 MG/ML | Freq: Once | INTRAVENOUS | Status: AC
Start: 2016-03-04 — End: 2016-03-03
  Administered 2016-03-04: 01:00:00 4 mg via INTRAVENOUS

## 2016-03-04 MED ORDER — KETAMINE HCL 100 MG/ML IJ SOLN
100 MG/ML | Freq: Once | INTRAMUSCULAR | Status: AC
Start: 2016-03-04 — End: 2016-03-03
  Administered 2016-03-04: 03:00:00 60 mg via INTRAVENOUS

## 2016-03-04 MED ORDER — ONDANSETRON HCL 4 MG/2ML IJ SOLN
4 MG/2ML | Freq: Once | INTRAMUSCULAR | Status: AC
Start: 2016-03-04 — End: 2016-03-03
  Administered 2016-03-04: 01:00:00 4 mg via INTRAVENOUS

## 2016-03-04 MED ORDER — DIAZEPAM 5 MG/ML IJ SOLN
5 MG/ML | Freq: Once | INTRAMUSCULAR | Status: AC
Start: 2016-03-04 — End: 2016-03-03
  Administered 2016-03-04: 01:00:00 5 mg via INTRAVENOUS

## 2016-03-04 MED ORDER — KETAMINE HCL 100 MG/ML IJ SOLN
100 MG/ML | Freq: Once | INTRAMUSCULAR | Status: AC
Start: 2016-03-04 — End: 2016-03-03
  Administered 2016-03-04: 03:00:00 40 mg via INTRAVENOUS

## 2016-03-04 MED ORDER — SODIUM CHLORIDE 0.9 % IV BOLUS
0.9 % | Freq: Once | INTRAVENOUS | Status: AC
Start: 2016-03-04 — End: 2016-03-04
  Administered 2016-03-04: 04:00:00 1000 mL via INTRAVENOUS

## 2016-03-04 MED ORDER — SODIUM CHLORIDE 0.9 % IV SOLN
0.9 % | INTRAVENOUS | Status: DC
Start: 2016-03-04 — End: 2016-03-04

## 2016-03-04 MED ORDER — SODIUM CHLORIDE 0.9 % IV BOLUS
0.9 % | Freq: Once | INTRAVENOUS | Status: DC
Start: 2016-03-04 — End: 2016-03-04

## 2016-03-04 MED ORDER — IOPAMIDOL 76 % IV SOLN
76 % | Freq: Once | INTRAVENOUS | Status: AC | PRN
Start: 2016-03-04 — End: 2016-03-03
  Administered 2016-03-04: 02:00:00 100 mL via INTRAVENOUS

## 2016-03-04 MED ORDER — FENTANYL CITRATE (PF) 100 MCG/2ML IJ SOLN
100 MCG/2ML | Freq: Once | INTRAMUSCULAR | Status: AC
Start: 2016-03-04 — End: 2016-03-03
  Administered 2016-03-04: 03:00:00 75 ug via INTRAVENOUS

## 2016-03-04 MED ORDER — FENTANYL CITRATE (PF) 100 MCG/2ML IJ SOLN
100 MCG/2ML | Freq: Once | INTRAMUSCULAR | Status: AC
Start: 2016-03-04 — End: 2016-03-04
  Administered 2016-03-04: 05:00:00 50 ug via INTRAVENOUS

## 2016-03-04 MED ORDER — SODIUM CHLORIDE 0.9 % IV BOLUS
0.9 % | Freq: Once | INTRAVENOUS | Status: AC
Start: 2016-03-04 — End: 2016-03-04
  Administered 2016-03-04: 03:00:00 1000 mL via INTRAVENOUS

## 2016-03-04 MED ORDER — KETAMINE HCL 100 MG/ML IJ SOLN
100 MG/ML | Freq: Once | INTRAMUSCULAR | Status: AC
Start: 2016-03-04 — End: 2016-03-04
  Administered 2016-03-04: 04:00:00 20 mg via INTRAVENOUS

## 2016-03-04 MED ORDER — HYDROmorphone (DILAUDID) injection Syrg 0.2 mg
1 | INTRAMUSCULAR | Status: AC | PRN
Start: 2016-03-04 — End: 2016-03-04

## 2016-03-04 MED ORDER — senna-docusate (SENNA-S) 8.6-50 mg per tablet 1 tablet
8.6-50 | Freq: Two times a day (BID) | ORAL | Status: AC
Start: 2016-03-04 — End: 2016-03-07
  Administered 2016-03-05 – 2016-03-07 (×6): 1 via ORAL

## 2016-03-04 MED ORDER — fentaNYL (SUBLIMAZE) injection 12.5 mcg
50 | INTRAMUSCULAR | Status: AC | PRN
Start: 2016-03-04 — End: 2016-03-04

## 2016-03-04 MED ORDER — HYDROmorphone (DILAUDID) 1 mg/mL injection Syrg
1 | INTRAMUSCULAR | Status: AC
Start: 2016-03-04 — End: ?

## 2016-03-04 MED ORDER — fentaNYL (SUBLIMAZE) injection
50 | INTRAMUSCULAR | Status: AC | PRN
Start: 2016-03-04 — End: 2016-03-04
  Administered 2016-03-04 (×4): 50 via INTRAVENOUS

## 2016-03-04 MED ORDER — polyethylene glycol (MIRALAX) packet 17 g
17 | Freq: Two times a day (BID) | ORAL | Status: AC
Start: 2016-03-04 — End: 2016-03-07
  Administered 2016-03-05 – 2016-03-07 (×6): 17 g via ORAL

## 2016-03-04 MED ORDER — HYDROmorphone (DILAUDID) injection Syrg 0.4 mg
1 | INTRAMUSCULAR | Status: AC | PRN
Start: 2016-03-04 — End: 2016-03-04

## 2016-03-04 MED ORDER — ibuprofen (ADVIL,MOTRIN) tablet 600 mg
600 | Freq: Three times a day (TID) | ORAL | Status: AC
Start: 2016-03-04 — End: 2016-03-07
  Administered 2016-03-04 – 2016-03-07 (×10): 600 mg via ORAL

## 2016-03-04 MED ORDER — HYDROmorphone (DILAUDID) injection Syrg 0.4 mg
1 | INTRAMUSCULAR | Status: AC | PRN
Start: 2016-03-04 — End: 2016-03-04
  Administered 2016-03-04: 19:00:00 0.4 mg via INTRAVENOUS

## 2016-03-04 MED ORDER — lactated Ringers infusion
INTRAVENOUS | Status: AC | PRN
Start: 2016-03-04 — End: 2016-03-04
  Administered 2016-03-04: 17:00:00 via INTRAVENOUS

## 2016-03-04 MED ORDER — acetaminophen (TYLENOL) tablet 975 mg
325 | ORAL | Status: AC | PRN
Start: 2016-03-04 — End: 2016-03-04
  Administered 2016-03-04: 17:00:00 975 mg via ORAL

## 2016-03-04 MED ORDER — naloxone (NARCAN) injection 0.04 mg
0.4 | INTRAMUSCULAR | Status: AC | PRN
Start: 2016-03-04 — End: 2016-03-04

## 2016-03-04 MED ORDER — HYDROmorphone (DILAUDID) injection Syrg
2 | INTRAMUSCULAR | Status: AC | PRN
Start: 2016-03-04 — End: 2016-03-04
  Administered 2016-03-04: 06:00:00 .5 via INTRAVENOUS

## 2016-03-04 MED ORDER — lactated Ringers infusion
INTRAVENOUS | Status: AC
Start: 2016-03-04 — End: 2016-03-05
  Administered 2016-03-04: 21:00:00 125 mL/h via INTRAVENOUS

## 2016-03-04 MED ORDER — HYDROmorphone (DILAUDID) injection Syrg 0.5 mg
1 | Freq: Once | INTRAMUSCULAR | Status: AC
Start: 2016-03-04 — End: 2016-03-04

## 2016-03-04 MED ORDER — HYDROmorphone (DILAUDID) injection Syrg 0.1 mg
1 | INTRAMUSCULAR | Status: AC | PRN
Start: 2016-03-04 — End: 2016-03-04

## 2016-03-04 MED ORDER — HYDROmorphone (DILAUDID) injection Syrg 0.6 mg
1 | INTRAMUSCULAR | Status: AC | PRN
Start: 2016-03-04 — End: 2016-03-04
  Administered 2016-03-04: 19:00:00 0.6 mg via INTRAVENOUS

## 2016-03-04 MED ORDER — electrolyte-R (pH 7.4) (NORMOSOL-R pH 7.4) iv solution SolP
INTRAVENOUS | Status: AC
Start: 2016-03-04 — End: 2016-03-05
  Administered 2016-03-04: 09:00:00 100 mL/h via INTRAVENOUS

## 2016-03-04 MED ORDER — HYDROmorphone (DILAUDID) injection Syrg
2 | INTRAMUSCULAR | Status: AC | PRN
Start: 2016-03-04 — End: 2016-03-04
  Administered 2016-03-04: 07:00:00 .5 via INTRAVENOUS

## 2016-03-04 MED ORDER — neostigmine methylsulfate (PROSTIGMIN) IV solution
1 | INTRAVENOUS | Status: AC | PRN
Start: 2016-03-04 — End: 2016-03-04
  Administered 2016-03-04: 18:00:00 4 via INTRAVENOUS

## 2016-03-04 MED ORDER — HYDROmorphone (DILAUDID) injection Syrg 0.6 mg
1 | INTRAMUSCULAR | Status: AC | PRN
Start: 2016-03-04 — End: 2016-03-04

## 2016-03-04 MED ORDER — meperidine (PF) (DEMEROL) 25 mg/mL Syrg 12.5 mg
25 | INTRAMUSCULAR | Status: AC | PRN
Start: 2016-03-04 — End: 2016-03-04

## 2016-03-04 MED ORDER — ondansetron (ZOFRAN) 4 mg/2 mL injection
4 | INTRAMUSCULAR | Status: AC | PRN
Start: 2016-03-04 — End: 2016-03-04
  Administered 2016-03-04: 18:00:00 4 via INTRAVENOUS

## 2016-03-04 MED ORDER — HYDROmorphone (DILAUDID) 1 mg/mL injection Syrg
1 | INTRAMUSCULAR | Status: AC
Start: 2016-03-04 — End: 2016-03-04
  Administered 2016-03-04: 11:00:00 0.5 via INTRAVENOUS

## 2016-03-04 MED ORDER — fentaNYL (SUBLIMAZE) injection 6.5 mcg
50 | INTRAMUSCULAR | Status: AC | PRN
Start: 2016-03-04 — End: 2016-03-04

## 2016-03-04 MED ORDER — fentaNYL (SUBLIMAZE) injection 25 mcg
50 | INTRAMUSCULAR | Status: AC | PRN
Start: 2016-03-04 — End: 2016-03-04

## 2016-03-04 MED ORDER — midazolam (PF) (VERSED) injection
1 | INTRAMUSCULAR | Status: AC | PRN
Start: 2016-03-04 — End: 2016-03-04
  Administered 2016-03-04: 17:00:00 2 via INTRAVENOUS

## 2016-03-04 MED ORDER — HYDROmorphone (DILAUDID) injection Syrg
2 | INTRAMUSCULAR | Status: AC | PRN
Start: 2016-03-04 — End: 2016-03-04
  Administered 2016-03-04: 18:00:00 0.5 via INTRAVENOUS

## 2016-03-04 MED ORDER — acetaminophen (TYLENOL) suppository 975 mg
650 | Freq: Three times a day (TID) | RECTAL | Status: AC
Start: 2016-03-04 — End: 2016-03-05

## 2016-03-04 MED ORDER — acetaminophen (TYLENOL) tablet 975 mg
325 | Freq: Three times a day (TID) | ORAL | Status: AC
Start: 2016-03-04 — End: 2016-03-07
  Administered 2016-03-04 – 2016-03-07 (×10): 975 mg via ORAL

## 2016-03-04 MED ORDER — lidocaine (LIDODERM) 5 % 1 patch
5 | Freq: Every day | TOPICAL | Status: AC
Start: 2016-03-04 — End: 2016-03-07
  Administered 2016-03-05: 13:00:00 1 via TRANSDERMAL

## 2016-03-04 MED ORDER — dexamethasone (DECADRON) injection 4 mg
4 | Freq: Once | INTRAMUSCULAR | Status: AC
Start: 2016-03-04 — End: 2016-03-04

## 2016-03-04 MED ORDER — calcium gluconate 100 mg/mL (10%) injection
100 | INTRAVENOUS | Status: AC | PRN
Start: 2016-03-04 — End: 2016-03-04
  Administered 2016-03-04: 18:00:00 2 via INTRAVENOUS

## 2016-03-04 MED ORDER — rocuronium (ZEMURON) injection
10 | INTRAVENOUS | Status: AC | PRN
Start: 2016-03-04 — End: 2016-03-04
  Administered 2016-03-04: 18:00:00 20 via INTRAVENOUS
  Administered 2016-03-04: 17:00:00 30 via INTRAVENOUS

## 2016-03-04 MED ORDER — glycopyrrolate (ROBINUL) injection
0.2 | INTRAMUSCULAR | Status: AC | PRN
Start: 2016-03-04 — End: 2016-03-04
  Administered 2016-03-04: 18:00:00 .6 via INTRAVENOUS

## 2016-03-04 MED ORDER — fentaNYL (SUBLIMAZE) injection 50 mcg
50 | INTRAMUSCULAR | Status: AC | PRN
Start: 2016-03-04 — End: 2016-03-04

## 2016-03-04 MED ORDER — sodium chloride, irrigation 0.9 % irrigation
0.9 | Status: AC | PRN
Start: 2016-03-04 — End: 2016-03-04
  Administered 2016-03-04: 18:00:00 1000

## 2016-03-04 MED ORDER — propofol 10 mg/ml (DIPRIVAN) injection
10 | INTRAVENOUS | Status: AC | PRN
Start: 2016-03-04 — End: 2016-03-04
  Administered 2016-03-04: 17:00:00 200 via INTRAVENOUS

## 2016-03-04 MED ORDER — HYDROmorphone (DILAUDID) injection Syrg 0.5 mg
1 | INTRAMUSCULAR | Status: AC | PRN
Start: 2016-03-04 — End: 2016-03-06

## 2016-03-04 MED ORDER — dexamethasone (DECADRON) injection
4 | INTRAMUSCULAR | Status: AC | PRN
Start: 2016-03-04 — End: 2016-03-04
  Administered 2016-03-04: 18:00:00 4 via INTRAVENOUS

## 2016-03-04 MED ORDER — lidocaine (PF) 20 mg/mL (2 %) Soln
20 | INTRAVENOUS | Status: AC | PRN
Start: 2016-03-04 — End: 2016-03-04
  Administered 2016-03-04: 17:00:00 60 via INTRAVENOUS

## 2016-03-04 MED ORDER — phenylephrine (NEO-SYNEPHRINE) injection
10 | INTRAMUSCULAR | Status: AC | PRN
Start: 2016-03-04 — End: 2016-03-04
  Administered 2016-03-04 (×3): 100 via INTRAVENOUS

## 2016-03-04 MED ORDER — gabapentin (NEURONTIN) capsule 600 mg
300 | ORAL | Status: AC | PRN
Start: 2016-03-04 — End: 2016-03-04
  Administered 2016-03-04: 17:00:00 600 mg via ORAL

## 2016-03-04 MED ORDER — HYDROmorphone (DILAUDID) injection Syrg 1 mg
1 | INTRAMUSCULAR | Status: AC | PRN
Start: 2016-03-04 — End: 2016-03-06
  Administered 2016-03-04 – 2016-03-06 (×8): 1 mg via INTRAVENOUS

## 2016-03-04 MED ORDER — ceFAZolin (ANCEF) IVPB 2 g in D5W (duplex)
2 | INTRAVENOUS | Status: AC | PRN
Start: 2016-03-04 — End: 2016-03-04
  Administered 2016-03-04: 18:00:00 2 via INTRAVENOUS

## 2016-03-04 MED ORDER — lactated Ringers infusion
INTRAVENOUS | Status: AC
Start: 2016-03-04 — End: 2016-03-05
  Administered 2016-03-05: 09:00:00 100 mL/h via INTRAVENOUS

## 2016-03-04 MED ORDER — lidocaine (PF) 2% (20 mg/mL) Soln 20 mg
20 | Freq: Once | INTRAMUSCULAR | Status: AC | PRN
Start: 2016-03-04 — End: 2016-03-04

## 2016-03-04 MED ORDER — ondansetron (ZOFRAN) 4 mg/2 mL injection 4 mg
4 | Freq: Three times a day (TID) | INTRAMUSCULAR | Status: AC | PRN
Start: 2016-03-04 — End: 2016-03-04

## 2016-03-04 MED ORDER — sodium chloride 0.9 % infusion 500 mL
Freq: Once | INTRAVENOUS | Status: AC
Start: 2016-03-04 — End: 2016-03-04

## 2016-03-04 MED ORDER — bacitracin-polymyxin b (POLYSPORIN) ointment (bulk)
500-10000 | TOPICAL | Status: AC | PRN
Start: 2016-03-04 — End: 2016-03-04
  Administered 2016-03-04: 19:00:00 .5 via TOPICAL

## 2016-03-04 MED ORDER — sodium chloride 0.9 % infusion
INTRAVENOUS | Status: AC
Start: 2016-03-04 — End: 2016-03-05

## 2016-03-04 MED FILL — HYDROMORPHONE 1 MG/ML INJECTION SYRINGE: 1 1 mg/mL | INTRAMUSCULAR | Qty: 1

## 2016-03-04 MED FILL — LIDODERM 5 % TOPICAL PATCH: 5 5 % | TOPICAL | Qty: 1

## 2016-03-04 MED FILL — ACEPHEN 325 MG RECTAL SUPPOSITORY: 325 325 mg | RECTAL | Qty: 3

## 2016-03-04 MED FILL — SENNA-S 8.6 MG-50 MG TABLET: 8.6-50 8.6-50 mg | ORAL | Qty: 1

## 2016-03-04 MED FILL — NORMOSOL-R PH 7.4 INTRAVENOUS SOLUTION: 100.00 100.00 mL/hr | INTRAVENOUS | Qty: 1000

## 2016-03-04 MED FILL — POLYETHYLENE GLYCOL 3350 17 GRAM ORAL POWDER PACKET: 17 17 gram | ORAL | Qty: 1

## 2016-03-04 MED FILL — GABAPENTIN 300 MG CAPSULE: 300 300 MG | ORAL | Qty: 2

## 2016-03-04 MED FILL — IBUPROFEN 400 MG TABLET: 400 400 MG | ORAL | Qty: 2

## 2016-03-04 MED FILL — TYLENOL 325 MG TABLET: 325 325 mg | ORAL | Qty: 3

## 2016-03-04 MED FILL — IBUPROFEN 600 MG TABLET: 600 600 MG | ORAL | Qty: 1

## 2016-03-04 MED FILL — FENTANYL CITRATE (PF) 100 MCG/2ML IJ SOLN: 100 MCG/2ML | INTRAMUSCULAR | Qty: 2

## 2016-03-04 MED FILL — MORPHINE SULFATE (PF) 4 MG/ML IV SOLN: 4 mg/mL | INTRAVENOUS | Qty: 1

## 2016-03-04 MED FILL — DIAZEPAM 5 MG/ML IJ SOLN: 5 mg/mL | INTRAMUSCULAR | Qty: 2

## 2016-03-04 MED FILL — SODIUM CHLORIDE 0.9 % IV SOLN: 0.9 % | INTRAVENOUS | Qty: 1000

## 2016-03-04 MED FILL — ONDANSETRON HCL 4 MG/2ML IJ SOLN: 4 MG/2ML | INTRAMUSCULAR | Qty: 2

## 2016-03-04 NOTE — ED Notes (Signed)
[facility] Bedside report given to [facility] mobile care. Verbal order placed per Dr.Price for 50 mcg fentanyl prior to transport. Pt A&Ox4, in no acute distress. Pt transported via EMS with copy or labs, H&P, radiology disc, transfer form, and all personal belongings.      Rosita FireKristin Lee Sanchez Hemmer, RN  03/04/16 [phone]

## 2016-03-04 NOTE — Unmapped (Signed)
Anesthesia Extubation Criteria:    Airway Device: endotracheal tube    Emergence Details:      Smooth      _x_      Stormy       __       Prolonged   __     Extubation Criteria:      Motor strength intact       _x_      Follows commands        _x_      Good airway reflexes      _x_      OP suctioned                  _x_        Follows commands:  Yes     Patient extubated:  Yes

## 2016-03-04 NOTE — Unmapped (Signed)
Faculty Attestation for Patient seen by a Resident Physician or Advanced Practice Provider    Date of Service: 03/04/2016    Colin [name], is a 55 y.o. male presents with  has no past medical history on file.a complaint of Fall-Major  .  The patient was seen and examined in room SR U3, the case discussed with  Dr. Verdie Drown . Please refer to the resident/APP documentation for details of the case.   I agree with the history, physical exam and medical plan for Colin [name].   Physical Exam and/or additional clinical information includes: Right hip tenderness, normal respiratory effort.      Consultants: Patient was seen in consultation with trauma as a trauma consult

## 2016-03-04 NOTE — Unmapped (Signed)
ORTHOPAEDIC SURGERY PROGRESS NOTE    ID:  Colin [name] is a 55 y.o. male with a lateral compression pelvic ring injury with R sup/inf rami fxs, R SI diastasis    ADMIT DATE:  03/04/2016    S:  No complaints at this time. Denies trouble breathing.  Pain well controlled.      O:  Filed Vitals:    03/04/16 0230 03/04/16 0504 03/04/16 0647   BP:  159/91 150/80   Pulse: 72 75 84   Temp:  98.2 ??F (36.8 ??C)    TempSrc:  Oral    Resp: 17 16 14    SpO2: 100% 99% 100%       General:  NAD; lying in bed  CV/Pulmonary:  Breathing unlabored    MSK:  Bilateral UE  SILT m/u/r/a  Wrist flexion/extension, finger flexion/extension/abduction intact  AIN/PIN intact  Cap refill < 2 sec    MSK:  BLE  SILT sp/dp/t/sural/saphenous  EHL/TA/GS intact  Toes up and down  Cap refill < 2 sec    Pain w Log roll on RLE    Labs:  Lab Results   Component Value Date    WBC 24.6* 03/04/2016    HGB 10.8* 03/04/2016    HCT 33.0* 03/04/2016    MCV 88.9 03/04/2016    PLT 278 03/04/2016     Lab Results   Component Value Date    CREATININE 0.95 03/04/2016    BUN 18 03/04/2016    NA 142 03/04/2016    K 5.0 03/04/2016    CL 111* 03/04/2016    CO2 25 03/04/2016     Lab Results   Component Value Date    INR 1.1 03/04/2016         A/P:  Colin [name] is a 55 y.o. male with a  lateral compression pelvic ring injury with R sup/inf rami fxs, R SI diastasis    -IV ABx: periop ancef  -WBS: NWB RLE   -PT/OT: recs pending  -OR plans: today for ring fixation  -Anticoagulation:  Lovenox SQ  -Dispo planning: per primary    Colin Essex, MD  Orthopaedic Surgery Resident  Ortho trauma Service Pager: [phone]

## 2016-03-04 NOTE — Unmapped (Signed)
ORTHOPAEDIC CONSULT H&P    ASSESSMENT: Colin [name] is a 55 y.o. male with unk PMHx  who presents  with a pelvic ring injury with R sup/inf rami fxs, SI diastasis    PLAN:  -Dispo: Pending OR plans  -NWB BLE, bedrest  -Likely to require fixation, will discuss timing with trauma  -patient will be staffed with team in AM    Danton Sewer, MD  Orthopedic Surgery Resident  Pager: [phone]  03/04/2016    ________________________________________________________________________    Requesting Service: ED  Ortho Attending: Dr. Cloyde Reams       HPI:  Date of Injury: 7/10   Transfer: [facility]  Antibiotics: No    Tetanus: Updated  Time of Last Meal: Unk    Colin [name] is an 55 y.o. Not Hispanic or Latino male with unk PMHx  who presents  with a pelvic ring injury.  Pt was working on the roof painting when he sustained a high fall, about 17 ft. [facility] ptx was diagnosed and chest tube placed. Pelvic fractures were revealed on CT and he was forwarded to [facility] for further evaluation.     Past Med/Surg/Family History  No past medical history on file.  No past surgical history on file.  No family history on file.    Past Social History  Social History   Substance Use Topics   ??? Smoking status: Not on file   ??? Smokeless tobacco: Not on file   ??? Alcohol Use: Not on file     No Known Allergies    Medications:    (Not in a hospital admission)  Current Facility-Administered Medications   Medication Dose Route Frequency Provider Last Rate Last Dose   ??? acetaminophen (TYLENOL) tablet 975 mg  975 mg Oral 3 times per day Jackelyn Knife, MD        Or   ??? acetaminophen (TYLENOL) suppository 975 mg  975 mg Rectal 3 times per day Jackelyn Knife, MD       ??? electrolyte-R (pH 7.4) (NORMOSOL-R pH 7.4) iv solution SolP  100 mL/hr Intravenous Continuous Jackelyn Knife, MD       ??? HYDROmorphone (DILAUDID) injection Syrg 0.5 mg  0.5 mg Intravenous Q3H PRN Jackelyn Knife, MD        Or   ??? HYDROmorphone (DILAUDID) injection Syrg 1  mg  1 mg Intravenous Q3H PRN Jackelyn Knife, MD       ??? ibuprofen (ADVIL,MOTRIN) tablet 600 mg  600 mg Oral TID WC Jackelyn Knife, MD       ??? lidocaine (LIDODERM) 5 % 1 patch  1 patch Transdermal Daily 0900 Jackelyn Knife, MD       ??? polyethylene glycol (MIRALAX) packet 17 g  17 g Oral BID Jackelyn Knife, MD       ??? senna-docusate (SENNA-S) 8.6-50 mg per tablet 1 tablet  1 tablet Oral BID Jackelyn Knife, MD         No current outpatient prescriptions on file.       ROS:  Negative except for HPI above    Physical Exam:  Patient Vitals for the past 24 hrs:   Pulse Resp SpO2   03/04/16 0230 72 17 100 %     Pain Score:      General: NAD  HEENT: atraumatic  CV/P: unlabored breathing  ABD: belly soft, non-tender  Musculoskeletal:  RUE:    No point tenderness or gross deformity   Vascular: radial pulse  2+, cap refill <2 sec   Strength: 5/5 deltoids, biceps, triceps, wrist flexors, wrist extensors, intrinsics, grip strength   AIN/PIN intact   Sensation: radial, median, ulnar, axillary nerves intact  LUE:   No point tenderness or gross deformity   Vascular: radial pulse 2+, cap refill <2 sec   Strength: 5/5 deltoids, biceps, triceps, wrist flexors, wrist extensors, intrinsics, grip strength   AIN/PIN intact   Sensation: radial, median, ulnar, axillary nerves intact  RLE:   No point tenderness or gross deformity, no pain on log roll   Vascular: DP and PT pulses 2+   Strength: 5/5 quadriceps, hamstrings, gastroc/soleus, anterior tibialis, EHL, FHL   Sensation: sural, saphenous, tibial, deep peroneal, superficial peroneal nerves intact  LLE:   No point tenderness or gross deformity, no pain on log roll   Vascular: DP and PT pulses 2+   Strength: 5/5 quadriceps, hamstrings, gastroc/soleus, anterior tibialis, EHL, FHL   Sensation: sural, saphenous, tibial, deep peroneal, superficial peroneal nerves intact          Labs:   Lab Results   Component Value Date    WBC 24.6* 03/04/2016    HGB 10.8* 03/04/2016    HCT 33.0*  03/04/2016    MCV 88.9 03/04/2016    PLT 278 03/04/2016     Lab Results   Component Value Date    GLUCOSE 169* 03/04/2016    BUN 18 03/04/2016    CO2 25 03/04/2016    CREATININE 0.95 03/04/2016    K 5.0 03/04/2016    NA 142 03/04/2016    CL 111* 03/04/2016    CALCIUM 8.1* 03/04/2016     No results found for: PTT, INR  No results found for: SEDRATE  No results found for: CRP    Imaging:   X-ray Portable Chest    03/04/2016  XRAY REPORT EXAM:  XR PORTABLE CHEST dated 03/04/2016 1:55 AM EDT INDICATION: Chest pain, unspecified;  COMPARISON: None FINDINGS: Right chest tube with right chest wall subcutaneous air. Mild opacity in the right upper lung. No pneumothorax or pleural effusion. The cardiomediastinal silhouette is within normal limits.     03/04/2016  IMPRESSION: Right chest tube with right chest wall subcutaneous air. No definite pneumothorax is visible. Mild opacity in the right upper lung could reflect contusion or pneumonia. Report Verified by: Etheleen Nicks at 03/04/2016 2:01 AM EDT    X-ray Comparison Images    03/04/2016  Images associated with this accession number were presented to [place] for comparison to an examination performed here.         Diagnoses:  Pelvic ring injury        Procedures Performed:   none

## 2016-03-04 NOTE — Unmapped (Signed)
PELVIC RING FIXATION  Procedure Note    MARQUEL POTTENGER  03/04/2016      Pre-op Diagnosis: Right SI Dx; Lateral compression type I Pelvic ring injury       Post-op Diagnosis: same    Procedure(s):  RIGHT SI SCREWS; CLOSED REDUCTION LC I  PELVIC RING INJURY; PELVIC EXTERNAL FIXATION      Surgeon(s):  Andee Lineman, MD    Anesthesia: General    Staff:   Circulator: Charlynn Grimes  Relief Circulator: Mirian Mo, RN  Relief Scrub: Quincy Simmonds, RN  Scrub Person: Theresa Mulligan, RN  Float: Oley Balm, RN  Resident: Mady Haagensen, MD; Barbarann Ehlers, MD    Estimated Blood Loss: less than 50 mL                 Specimens: * No specimens in log *           Drains:   Chest Tube Right Midaxillary;Fourth intercostal space (Active)   Number of days:       IUC (Foley) (Active)   Number of days:0             There were no complications unless listed below.        Inocente Salles Amisadai Woodford     Date: 03/04/2016  Time: 2:28 PM

## 2016-03-04 NOTE — Unmapped (Signed)
Pt switched from paper charting to [facility] charting.

## 2016-03-04 NOTE — Unmapped (Signed)
Admitting team at bedside. Pt repositioned for comfort.Call light in reach. Will continue to monitor.

## 2016-03-04 NOTE — Unmapped (Signed)
Admitting team at bedside

## 2016-03-04 NOTE — Unmapped (Signed)
Anesthesia Post Note    Patient: Colin [name]    Procedure(s) Performed: Procedure(s):  PELVIC RING FIXATION    Anesthesia type: general endotracheal    Patient location: PACU    Post pain: Adequate analgesia    Post assessment: no apparent anesthetic complications, tolerated procedure well and no evidence of recall    Last Vitals:   Filed Vitals:    03/04/16 1452 03/04/16 1507 03/04/16 1522 03/04/16 1534   BP: 122/74 144/89 120/72    Pulse: 68 73 72 72   Temp:       TempSrc:       Resp: 15 15 14 9    Height:       Weight:       SpO2: 100% 100% 100% 100%        Post vital signs: stable    Level of consciousness: awake, alert  and oriented    Complications: None

## 2016-03-04 NOTE — Unmapped (Signed)
Anesthesia Transfer of Care Note    Patient: Colin [name]  Procedure(s) Performed: Procedure(s):  PELVIC RING FIXATION    Patient location: PACU    Anesthesia type: general endotracheal    Airway Device on Arrival to PACU/ICU: Nasal Cannula    IV Access: Peripheral    Monitors Recommended to be Used During PACU/ICU: Standard Monitors    Outstanding Issues to Address: None    Level of Consciousness: awake, alert  and oriented    Post vital signs:    Filed Vitals:    03/04/16 1446   BP: 138/77   Pulse: 81   Temp: 98.0 ??F    Resp: 20   SpO2: 99%       Complications: None      Date 03/03/16 0700 - 03/04/16 0659(Not Admitted) 03/04/16 0700 - 03/05/16 0659   Shift 0700-1459 1500-2259 2300-0659 24 Hour Total 0700-1459 1500-2259 2300-0659 24 Hour Total   I  N  T  A  K  E   I.V.     1000  (12.2)   1000  (12.2)      Volume (mL) (lactated Ringers infusion)     1000   1000    Shift Total  (mL/kg)     1000  (12.2)   1000  (12.2)   O  U  T  P  U  T   Urine     1000   1000      Urine     1000   1000    Shift Total  (mL/kg)     1000  (12.2)   1000  (12.2)   Weight (kg)     81.6 81.6 81.6 81.6

## 2016-03-04 NOTE — Unmapped (Signed)
Patient reports increased pain after xray.  Trauma surgery team paged.  New orders acknowledged.

## 2016-03-04 NOTE — Unmapped (Signed)
[place] ED Note    Date of Service: 03/04/2016    Reason for Visit: Fall-Major      Patient History     HPI:  This is a 55 y.o. male with a PMH listed below who presents with an initial complaint of Fall-Major    55 year old male who reports he is otherwise healthy sustained a fall while he was on top of his roof attempting to do some painting.  Patient is suspected to have fallen approximately 17 feet.  He states he fell on his right side.  EMS was activated and he presented to outside hospital.  This reportedly did not lose consciousness.  He takes no blood thinners.  His pain was primarily located is his right hip and right anterior chest wall.  Describes an achy sensation.  Also time of injury.  Nothing alleviates.  Nothing exacerbates.  Does not radiate.  Not progressively changing.  At the outside facility was reportedly had a head CT which was unremarkable.  C-spine CT which was unremarkable.  Chest CT which showed evidence of a right-sided pneumothorax status post chest tube placement at outside facility.  CT abdomen and pelvis with complex pelvic fractures including small surrounding hematoma without active extravasation.    With the exception of the above, there are no aggravating or alleviating factors.    No past medical history on file.    No past surgical history on file.    Colin [name]  has no tobacco, alcohol, and drug history on file.    Patient's Medications    No medications on file       Allergies:   Allergies as of 03/03/2016   ??? (Not on File)       PMH: Nursing notes reviewed   PSH: Nursing notes reviewed   FH: Nursing notes reviewed   MEDS: Nursing notes and chart reviewed       Review of Systems     ROS:  See HPI. A full 10-point review of systems was conducted and is otherwise negative unless noted above.     Physical Exam     ED Triage Vitals   Vital Signs Group      Temp --       Temp src --       Pulse --       Heart Rate Source --        Resp --       SpO2 --       BP --       BP Location --       BP Method --       Patient Position --    SpO2 --    O2 Device --      Filed Vitals:    03/04/16 0230   Pulse: 72   Resp: 17   SpO2: 100%       General:  NAD  HEENT:  normocephalic, atraumatic; PERRL, EOMI, MMM, O/P benign, Midface is stable, there is no scalp laceration or hematoma.  Neck:  No meningismus; trachea midline   Pulmonary:   No increased effort, symmetric chest expansion, lung sounds clear to auscultation bilaterally with good air entry  Cardiac:  Regular rate, regular rhythm, no murmurs appreciated  Abdomen:  Soft; non-tender, non-distended; no guarding;  Musculoskeletal:  Patient's pelvis is wrapped in a blanket and he has known pelvic fractures and therefore no palpation was performed of the pelvis.  Patient is a right chest  tube in place.  There is no air leak.  There is minimal drainage.  Patient has no deformity or pain to palpation of bilateral upper or lower extremities.  Vascular:  Warm and well perfused; 2+ peripheral pulses in bilateral UE's and LE's  Skin:  Warm and well perfused, no rashes or gross lesions appreciated  Neuro:  Awake and alert, able to follow commands, speech fluent, cranial nerves II - XII grossly intact; gait, strength, and sensation grossly intact;   Psych:  appropriate mood and affect  GU:  Deferred      Diagnostic Studies     Labs:  Labs Reviewed   BASIC METABOLIC PANEL - Abnormal; Notable for the following:     Chloride 111 (*)     Glucose 169 (*)     Calcium 8.1 (*)     All other components within normal limits    Narrative:     As of 10/24/2014 the estimated GFR is calculated from serum creatinine using the Chronic Kidney Disease  Epidemiology Collaboration (CKD-EPI) equation in patients 18 years and older.  The reference range is   >60 mL/min/1.64m2.  eGFR values greater than 90 will be reported as >24mL/min/1.73m2.  Reference: Cherie Dark AS, Suella Grove [facility], Stefano Gaul AF, 3rd, Feldman HI, et.  al.  A new equation to estimate glomerular filtration rate.  Ann Intern Med. 2009:150(9):604-12   VENOUS BLOOD GAS, LINE/SYRINGE - Abnormal; Notable for the following:     PH-Line Draw 7.26 (*)     PCO2-Line Draw 55 (*)     Base Excess-Line Draw -2.3 (*)     Reduced Hemoglobin-Line Draw 45.4 (*)     All other components within normal limits   CBC - Abnormal; Notable for the following:     WBC 24.6 (*)     RBC 3.71 (*)     Hemoglobin 10.8 (*)     Hematocrit 33.0 (*)     All other components within normal limits   DIFFERENTIAL - Abnormal; Notable for the following:     Neutrophils Relative 92.4 (*)     Lymphocytes Relative 1.8 (*)     Neutrophils Absolute 16109 (*)     Lymphocytes Absolute 443 (*)     Monocytes Absolute 1402 (*)     Eosinophils Absolute 0 (*)     All other components within normal limits   HEPATIC FUNCTION PANEL - Abnormal; Notable for the following:     Total Protein 5.7 (*)     Albumin 3.4 (*)     All other components within normal limits   PROTIME-INR - Abnormal; Notable for the following:     Protime 14.6 (*)     All other components within normal limits   LACTIC ACID, VENOUS, WHOLE BLOOD, [facility]   CBC   ABO/RH   ANTIBODY SCREEN       Radiology:  X-ray Portable Chest    03/04/2016  XRAY REPORT EXAM:  XR PORTABLE CHEST dated 03/04/2016 1:55 AM EDT INDICATION: Chest pain, unspecified;  COMPARISON: None FINDINGS: Right chest tube with right chest wall subcutaneous air. Mild opacity in the right upper lung. No pneumothorax or pleural effusion. The cardiomediastinal silhouette is within normal limits.     03/04/2016  IMPRESSION: Right chest tube with right chest wall subcutaneous air. No definite pneumothorax is visible. Mild opacity in the right upper lung could reflect contusion or pneumonia. Report Verified by: Etheleen Nicks at 03/04/2016 2:01 AM EDT    X-ray Comparison  Images    03/04/2016  Images associated with this accession number were presented to [place] for comparison to an examination performed here.            Emergency Department Procedures / Bedside Ultrasound      There were no procedures preformed during this encounter.    ED Course and MDM     Colin [name] is a 55 y.o. male who presented to the emergency department with an initial complaint of Fall-Major        The patient was admitted to the A Pod where they were seen and evaluated by myself  and the Attending Emergency Department Physician, Dr. Jeani Hawking, MD .       A thorough history and physical was performed and appropriate labs, imaging, and consults were obtained.      On initial presentation, the patient was hemodynamically stable, and in No acute distress.  Patient received fentanyl according to EMS in route.  Patient reported his pain is well-controlled.  Patient had imaging in the outside facility including head CT described as no acute injury, C-spine CT without acute injury.  Patient had right pneumothorax status post chest tube at the outside facility.  Patient had pelvic fractures status post binding the bedsheet by EMS.  Repeat examination showed no acute abnormalities outside of those described above..  Trauma alert had been contacted and they presented to bedside to evaluate the patient and they agreed to accept this patient onto their service.  Orthopedics was consult on their behalf to facilitate evaluation; however, their formal recommendations are pending at the time of transfer of care to trauma surgery.    Consults:  Trauma surgery  Ortho    Summary of Treatment in ED:    ED Treatment Medications:  Medications   senna-docusate (SENNA-S) 8.6-50 mg per tablet 1 tablet (not administered)   polyethylene glycol (MIRALAX) packet 17 g (not administered)   electrolyte-R (pH 7.4) (NORMOSOL-R pH 7.4) iv solution SolP (not administered)   acetaminophen (TYLENOL) tablet 975 mg (not administered)     Or   acetaminophen (TYLENOL) suppository 975 mg (not administered)   lidocaine (LIDODERM) 5 % 1 patch (not administered)   ibuprofen  (ADVIL,MOTRIN) tablet 600 mg (not administered)   HYDROmorphone (DILAUDID) injection Syrg 0.5 mg (not administered)     Or   HYDROmorphone (DILAUDID) injection Syrg 1 mg (not administered)       Impression     1. Fall, initial encounter    2. Fracture of pelvis, unspecified part of pelvis, initial encounter    3. Pneumothorax, right         Plan     -At this time the patient has been admitted to Trauma surgery for further evaluation and management. The patient will continue to be monitored here in the emergency department until which time he is moved to his new treatment location.        Ahaana Rochette Sharlynn Oliphant, MD, PGY-3  [facility]          Ruffin Frederick, MD  Resident  03/04/16 [phone]

## 2016-03-04 NOTE — Unmapped (Signed)
Occupational Therapy/Physical Therapy   Reason Patient Not Seen         Name: Colin [name]  DOB: 1961/05/13  Attending Physician: Jeani Hawking, MD  Admission Diagnosis: Pneumothorax, right [J93.9]  Fall, initial encounter [W19.XXXA]  Fracture of pelvis, unspecified part of pelvis, initial encounter [S32.9XXA]  Date: 03/04/2016    Unable to see patient due to : Pt in OR; will follow up for evaluations post-op       Wells Guiles, OTR/L, MOT  [facility]   Pager: [phone]  Department Phone: [phone]

## 2016-03-04 NOTE — Unmapped (Signed)
Report received from [place]. Pt in hospital bed. Chest tube patent connected to wall suction. Vitals stable. Foley in place. Dilaudid and Tylenol given per MAR. Pt resting. SR on monitor. Call light in reach. Will continue to monitor.

## 2016-03-04 NOTE — Unmapped (Addendum)
[place]  DEPARTMENT OF ANESTHESIOLOGY  PRE-PROCEDURAL EVALUATION    Colin [name] is a 55 y.o. year old male presenting for:    Procedure(s):  PELVIC RING FIXATION    Surgeon:   Andee Lineman, MD    Chief Complaint         Review of Systems     Anesthesia Evaluation    Patient summary reviewed.       I have reviewed the History and Physical Exam, any relevant changes are noted in the anesthesia pre-operative evaluation.      Cardiovascular:    Exercise tolerance: good    (-) hypertension, past MI, angina.    Neuro/Muscoloskeletal/Psych:      (-) seizures, CVA, back problems.   Comments: Fall    Pulmonary:      (-) COPD, sleep apnea.   ROS comment: Right PTX with 5 th rib fx      GI/Hepatic/Renal:      (-) GERD, hepatitis, renal disease.    Endo/Other:        (-) diabetes mellitus.       Past Medical History     No past medical history on file.    Past Surgical History     No past surgical history on file.    Family History     No family history on file.    Social History     Social History     Social History   ??? Marital Status: Divorced     Spouse Name: N/A   ??? Number of Children: N/A   ??? Years of Education: N/A     Occupational History   ??? Not on file.     Social History Main Topics   ??? Smoking status: Not on file   ??? Smokeless tobacco: Not on file   ??? Alcohol Use: Not on file   ??? Drug Use: Not on file   ??? Sexual Activity: Not on file     Other Topics Concern   ??? Not on file     Social History Narrative   ??? No narrative on file       Medications     Allergies:  No Known Allergies    Home Meds:  Prior to Admission medications as of <Not Reviewed>   Not on File       Inpatient Meds:  Scheduled:   ??? acetaminophen  975 mg Oral 3 times per day    Or   ??? acetaminophen  975 mg Rectal 3 times per day   ??? ibuprofen  600 mg Oral TID WC   ??? lidocaine  1 patch Transdermal Daily 0900   ??? polyethylene glycol  17 g Oral BID   ??? senna-docusate  1 tablet Oral BID     Continuous:   ??? electrolyte 100 mL/hr (03/04/16 0511)        PRN: HYDROmorphone **OR** HYDROmorphone    Vital Signs     Wt Readings from Last 3 Encounters:   No data found for Wt     Ht Readings from Last 3 Encounters:   No data found for Ht     Temp Readings from Last 3 Encounters:   03/04/16 98.2 ??F (36.8 ??C) Oral     BP Readings from Last 3 Encounters:   03/04/16 150/80     Pulse Readings from Last 3 Encounters:   03/04/16 84     SpO2 Readings from Last 3 Encounters:   03/04/16 100%  Physical Exam     Airway:     Mallampati: III  TM distance: > = 3 FB  (-) neck not short    (+) facial hair     Dental:        Pulmonary:        (-) no wheezes.    Cardiovascular:       Neuro/Musculoskeletal/Psych:    Mental status: alert and oriented to person, place and time.          Abdominal:     Not obese.    Current OB Status:       Other Findings:        Laboratory Data     Lab Results   Component Value Date    WBC 24.6* 03/04/2016    HGB 10.8* 03/04/2016    HCT 33.0* 03/04/2016    MCV 88.9 03/04/2016    PLT 278 03/04/2016       No results found for: [facility]    Lab Results   Component Value Date    GLUCOSE 169* 03/04/2016    BUN 18 03/04/2016    CO2 25 03/04/2016    CREATININE 0.95 03/04/2016    K 5.0 03/04/2016    NA 142 03/04/2016    CL 111* 03/04/2016    CALCIUM 8.1* 03/04/2016    ALBUMIN 3.4* 03/04/2016    PROT 5.7* 03/04/2016    ALKPHOS 43 03/04/2016    ALT 15 03/04/2016    AST 34 03/04/2016    BILITOT 0.3 03/04/2016       Lab Results   Component Value Date    INR 1.1 03/04/2016       No results found for: PREGTESTUR, PREGSERUM, HCG, HCGQUANT    Anesthesia Plan     ASA 2           Anesthesia Type:  general endotracheal.     (Standard monitoring with PIV, multimodal analgesics and PACU)    Intravenous induction.    Anesthetic plan and risks discussed with patient.    Plan, alternatives, and risks of anesthesia, including death, have been explained to and discussed with the patient/legal guardian.  By my assessment, the patient/legal guardian understands and agrees.   Scenario presented in detail.  Questions answered.    Use of blood products discussed with patient whom consented to blood products.   Plan discussed with CRNA and RNSA.

## 2016-03-04 NOTE — Unmapped (Signed)
INTRA-OP POST BRIEFING NOTE: CORDELL GUERCIO      Specimens:     Prior to leaving the room: Nurse confirmed name of procedure, completion of instrument, sponge & needle counts, reads specimen labels aloud including patient name and addresses any equipment issues? Nurse confirmed wound class. Nurse to surgeon and anesthesia: What are key concerns for recovery and management of the patient?  Yes      Blood products stored at appropriate temperatures prior to return to blood bank (if applicable)? N/A      Patient identification band secured on patient prior to transfer out of the operating room? Yes      Other Comments:     Signed: Charlynn Grimes    Date: 03/04/2016    Time: 2:50 PM

## 2016-03-04 NOTE — Unmapped (Signed)
Trauma Nurse Clinician Admission Progress Note    Trauma Team multi-disciplinary rounds started at 7:30am Pt off unit in OR with Orthopedics service for Pelvis ex-fix placement.    Connye Burkitt, RN  Trauma Nurse Clinician   Pager: [phone]  Trauma Nurse Clinician Charge Phone: [phone]

## 2016-03-04 NOTE — Unmapped (Signed)
Pain medication administered per MAR.

## 2016-03-04 NOTE — Unmapped (Signed)
[facility]                       MEDICAL CENTER     PATIENT NAME:   Colin [name], Colin [name]               MRN: [id number]  DATE OF BIRTH:  07-02-1961                     CSN: [id number]  SURGEON:        Andee Lineman, M.D.       ADMIT DATE: 03/04/2016  SERVICE:        Orthopaedic Surgery and Sports Med  DICTATED BY:    Andee Lineman, M.D.       SURGERY DATE: 03/04/2016                                    OPERATIVE REPORT     PREOPERATIVE DIAGNOSIS(ES):  1. Closed dislocation sacrum (right sacroiliac dislocation); ICD-9 code     839.42.  2. Closed fracture pubis (pubis body parasymphyseal fracture with associated     rami fracture and lateral compression type I ring injury right side);     ICD-9 code 808.2.  3. Closed fracture pubis (pubis body parasymphyseal fracture with associated     rami fracture and lateral compression type I ring injury right side);     ICD-9 code 808.2.     POSTOPERATIVE DIAGNOSIS(ES):  1. Closed dislocation sacrum (right sacroiliac dislocation); ICD-9 code     839.42.  2. Closed fracture pubis (pubis body parasymphyseal fracture with associated     rami fracture and lateral compression type I ring injury right side);     ICD-9 code 808.2.  3. Closed fracture pubis (pubis body parasymphyseal fracture with associated     rami fracture and lateral compression type I ring injury right side);     ICD-9 code 808.2.     PROCEDURE(S) PERFORMED:  1. Percutaneous skeletal fixation of posterior pelvic ring fracture and/or     dislocation, includes ilium, sacroiliac joint, and/or sacrum (right     sacroiliac joint); CPT code 78295.  2. Closed treatment of pelvic ring fracture, dislocation, diastasis, or     subluxation with manipulation requiring more than local anesthesia     (anterior distraction application of lateral compression I type injury     with parasymphyseal and rami fractures); CPT code 27194.59.  3. Application of a uniplane (pins or  wires in one plane) unilateral external     fixation system (anterior external fixator); CPT code 20690.51.     ATTENDING SURGEON:  Andee Lineman, M.D.     ASSISTANT(S):  Barbarann Ehlers, M.D.; Alcide Evener, M.D.     ANESTHESIA:  General via endotracheal tube.     ESTIMATED BLOOD LOSS:  Less than 100 mL.     COMPLICATION(S):  None.     CLINICAL NOTE:  This is a 55 year old male who sustained the above-stated  pelvic ring injury and was cleared for surgical intervention.  The risks,  benefits, and alternative procedures of operative versus nonoperative  treatment were discussed.  He was given the opportunity to ask questions.  I  answered all of his questions.  He signed informed surgical consent and was  scheduled and taken to surgery.     DETAILS OF PROCEDURE(S):  The patient was taken to the operating room.  General anesthesia was induced via an endotracheal tube.  The patient  received 2 grams of IV cefazolin prior to initiation of the surgical  procedure.  He was transferred to the OSI pelvic fracture table and placed in  supine position.  The anterior pelvis and flank were prepped and draped in a  sterile fashion.  A formal timeout procedure was then performed, confirming  the appropriate anterior pelvic ring and informed consent document.     Attention was directed to the sacroiliac dislocation.  This was felt to be  amenable to percutaneous stabilization.  Through a 1.5-cm wound a guide pin  for an Asnis screw was advanced across the lateral ilium and the S2 sacral  ala into the body of S2, confirmed with inlet, outlet, AP, lateral, and Judet  radiographs until the wire was fully seated in the opposite side of the  sacrum, followed by a cannulated 5.6-mm drill, followed by a  partially-threaded 8-mm cannulated Asnis screw with a washer which was fully  seated and used to reduce the dislocation.  This was followed by the  placement of a second guide wire across the lateral ilium on the right side  across  the S1 sacral ala, the S1 body into the left side sacral ala,  confirmed again with AP pelvis, inlet, outlet, lateral, and Judet  radiographs, followed by a cannulated 5.6-mm drill and a partially-threaded  8-mm diameter Asnis screw with a washer.  This stabilized the posterior  pelvic ring.  Attention was then directed to the anterior pelvic ring.  Through percutaneous wounds 5 x 250 mm Schanz pins were placed in the  anterior inferior iliac spine in a supra-acetabular manner, confirmed with  fluoroscopy including inlet, outlet, and lateral radiographs as well as Judet  radiographs.  Once the screws were fully seated along the LC2 corridor, the  anterior pelvis was manipulated in a distraction movement, and an anterior  pelvic external fixator was applied to stabilize the pelvic ring in a near  anatomic position on inlet, outlet, and AP pelvis radiographs.  The external  fixator construct was fully tightened.  The pin sites were bolstered.  The SI  screw wound was copiously irrigated and closed in layers, the subcutaneous  layer with inverted 2-0 Vicryl suture and the skin with surgical staples.  Sterile compressive dressings were applied.  The patient tolerated the  procedure well.  He was taken to the PACU in stable condition.  There were Colin [name]  complications.     I was the attending orthopedic surgeon for this procedure.  I was present for  the key and critical portions of the operation which included percutaneous  stabilization of the right sacroiliac dislocation with closed reduction and  manipulation of the anterior pelvic lateral compression type I ring injury  with application of an anterior pelvic external fixator.                                                Andee Lineman, M.D.  MA/jlq  D:  03/04/2016 14:20  T:  03/05/2016 00:38  Job #:  [id number]     c:   Andee Lineman, M.D.          Barbarann Ehlers, M.D.          Alcide Evener, M.D.     OPERATIVE REPORT  PAGE    1 of   1

## 2016-03-04 NOTE — Unmapped (Signed)
Documentation of medications administered based on content entered on [facility] Emergency Department Critical Assessment Flowsheet (Form [id number]).  Please see scanned form for primary source documentation.

## 2016-03-04 NOTE — Unmapped (Signed)
[facility]    Trauma / Critically Ill Assessment      Colin [name]  [id number]    Reason for Referral / Presenting Problem:   Reason for referral: Other (make comment)  Transfer from [facility]      Family Contact and Involvement:  Name/phone #/relationship of family contacted : Parag Dorton, father, [phone] - not called but is patient's emergency contact  Sallyanne Havers on way from OSH     Demographic Information Verified: Registration Contacted            Assessment and Social Work Interventions:  Patient is a divorced 55 year old caucasian male transferred from OSH following a major fall.  Patient is alert and oriented.     Friends Roger Seabaum & Loura Halt arrived at bedside for patient.     Safety Concerns:      unknown          Referral / Disposition Plan:    Admit. Inpatient SW tasked for any discharge needs.    Elliot Gault MSW LSW

## 2016-03-04 NOTE — Unmapped (Signed)
Trauma Surgery H&P    CIRCUMSTANCES OF TRAUMA   Juanito Doom    Injury Date: 03/03/16  Injury Time: 2000    Time Paged: 0129  Trauma Service Activation: Alert: Falls >20 feet  Time of Trauma Team Evaluation: 0140    ED Attending: Stark Bray  Referring Hospital: [facility]    Scene Evaluation by EMS/Air Care: Yes  Transport Mode: Ambulance    TRAUMA TEAM   Attending: Orie Fisherman    Fellow:   SR Resident: Duane Lope Resident: Alfonse Ras  Nurse Practitioner:    PREHOSPITAL   Fall     Fluid prior to arrival:   RBC:No   FFP:No   Crystalloid:Yes Normal saline 125 cc/hr   TXA: No  LOC No   Duration: N/A  GCS at scene: Eyes: 4 Verbal: 5 Motor: 6 Score: 15    HISTORY   Chief Complaint: Fall from ladder approximately 17 ft above ground    History of present trauma: Patient is a 55 yo M who fell from a ladder approximately 17 ft above ground yesterday while painting at home. He states when he fell, he landed on his right side and hit his hip, chest, and right wrist. He denied LOC and was accompanied by his friend. He was taken to an outside hospital and underwent CT abdomen, chest, pelvis, and c-spine. These showed R rib fractures and a pneumothorax for which he received a R chest tube and a right pelvic fracture. Given his mechanism and injuries, he was stabilized and transferred to [facility] for further care.     Past Medical History: No past medical history on file.     Past Surgical History: No past surgical history on file.     Family History: No family history on file.     Social History:   Social History     Social History   ??? Marital Status: Divorced     Spouse Name: N/A   ??? Number of Children: N/A   ??? Years of Education: N/A     Occupational History   ??? Not on file.     Social History Main Topics   ??? Smoking status: Not on file   ??? Smokeless tobacco: Not on file   ??? Alcohol Use: Not on file   ??? Drug Use: Not on file   ??? Sexual Activity: Not on file     Other Topics Concern   ??? Not on file     Social History Narrative   ??? No  narrative on file       Tobacco: cigars   Alcohol: Social   Other drugs: occasional marijuana    MEDICATIONS   Medications:    Aspirin: no   Clopidogrel: no   Warfarin: no   Other medications: aleve        No current facility-administered medications on file prior to encounter.     No current outpatient prescriptions on file prior to encounter.     Allergies: None        Allergies not on file     REVIEW OF SYSTEMS   Review of Systems   Constitutional: Negative for fever, chills and fatigue.   HENT: Negative for sore throat.    Eyes: Negative for pain and visual disturbance.   Respiratory: Negative for cough, chest tightness, shortness of breath and wheezing.    Cardiovascular: Positive for chest pain. Negative for palpitations.   Gastrointestinal: Negative for nausea, vomiting, abdominal pain, diarrhea, constipation and  abdominal distention.   Genitourinary: Negative for flank pain.   Musculoskeletal: Negative for back pain and neck pain.   Skin: Negative for pallor and rash.   Neurological: Negative for seizures, syncope, weakness and headaches.   Psychiatric/Behavioral: Negative for confusion and decreased concentration.       PRIMARY SURVEY   Intubated: No  Temp: 98.7  Pulse: 72  B/P: 132/80  RR: 17  SpO2: 100%  Eyes: 4  Verbal: 5  Motor: 6  GCS: 15    PHYSICAL EXAM / SECONDARY SURVEY   Physical Exam   Constitutional: He is oriented to person, place, and time. He appears well-developed and well-nourished.   HENT:   Head: Normocephalic and atraumatic.   Eyes: Conjunctivae are normal. Pupils are equal, round, and reactive to light.   Neck: Normal range of motion. No tracheal deviation present.   Cardiovascular: Normal rate, regular rhythm and intact distal pulses.    Pulmonary/Chest: Effort normal. No respiratory distress. He exhibits no tenderness.   R chest tube in place, no airleak present; pulling 1500 on IS   Abdominal: Soft. He exhibits no distension. There is no tenderness. There is no rebound and no  guarding.   Genitourinary: Penis normal.   Musculoskeletal: Normal range of motion.   Pelvis unstable   Neurological: He is alert and oriented to person, place, and time.   Skin: Skin is warm and dry. No rash noted. No erythema.   Psychiatric: He has a normal mood and affect. His behavior is normal.       LABS                   Lab 03/04/16  0137   LACTATE BLOOD VENOUS 1.9         No results for input(s): TEGANGLE, TEGKTIME, TEGLYSIS30, TEGRTIME, CBMZ in the last 72 hours.    Invalid input(s): TEGMAXAMPLE                IMAGING     X-ray Portable Chest    03/04/2016  XRAY REPORT EXAM:  XR PORTABLE CHEST dated 03/04/2016 1:55 AM EDT INDICATION: Chest pain, unspecified;  COMPARISON: None FINDINGS: Right chest tube with right chest wall subcutaneous air. Mild opacity in the right upper lung. No pneumothorax or pleural effusion. The cardiomediastinal silhouette is within normal limits.     03/04/2016  IMPRESSION: Right chest tube with right chest wall subcutaneous air. No definite pneumothorax is visible. Mild opacity in the right upper lung could reflect contusion or pneumonia. Report Verified by: Etheleen Nicks at 03/04/2016 2:01 AM EDT    X-ray Comparison Images    03/04/2016  Images associated with this accession number were presented to [place] for comparison to an examination performed here.     CT C/A/P at [facility]:  FINDINGS:  Chest:    Mediastinum: No mediastinal lymphadenopathy is identified. The thoracic  aorta is normal in caliber without evidence of thoracic aortic dissection.    Lungs/pleura: is a large right-sided pneumothorax. There is a background of  upper lobe predominant interstitial fibrosis, mainly in a peribronchovascular  distribution. On the left, there are numerous centrilobular and  perilymphatic nodules seen throughout the lungs, most with ground-glass  attenuation, with associated mild architectural distortion.    Soft Tissues/Bones: There is a fracture of the right hip anterolateral rib,  without  displacement. Remaining bony structures are intact.    Abdomen/Pelvis:    Organs: The liver, gallbladder, spleen, and pancreas are unremarkable in  appearance.  The adrenal glands are within normal limits. The kidneys  enhance homogeneously bilaterally.    GI/Bowel: No large bowel abnormalities are found. The appendix is normal.  The small bowel is unremarkable.    Pelvis: There is hemorrhage within the rightward aspect the pelvis, extending  into the previous singular space. There is loss of the fat planes involving  the adductor musculature on the right, suggesting a muscular hematoma as  well. There is evidence of active extravasation of contrast within the lower  rightward aspect the pelvis (images 180-183). There is displacement of the  prostate and rectum to the left.    Peritoneum/Retroperitoneum: Abdominal aorta is normal in caliber. No  retroperitoneal lymphadenopathy.    Bones/Soft Tissues: Comminuted fracture is found involving the inferior pubic  ramus, the parasymphyseal right pubic bone, an the anterior column of the  right acetabulum. There is diastases of the right sacroiliac joint, with a  small fracture fragment seen adjacent to the posterior aspect of the ileum.  The leftward aspect of the pelvis is intact. No bony abnormalities are  identified within the lumbar spine.    1. Complex right pelvic fracture, with extensive surrounding hemorrhage, and  with evidence of active extravasation into the pelvis.  2. Right pneumothorax, presumably secondary to the right anterolateral 5th  rib fracture. There is a background of interstitial lung disease.    CT C-spine  FINDINGS:  BONES/ALIGNMENT: There is no evidence of an acute cervical spine fracture.  There is normal alignment of the cervical spine.    DEGENERATIVE CHANGES: Mild degenerative disease at C5-C6.    SOFT TISSUES: Pneumothorax on the right is noted, better evaluated on the CT  chest. Prevertebral soft tissues are unremarkable in  appearance.      CT Head  FINDINGS:  BRAIN/VENTRICLES:    *Ventricles and sulci: Appropriate for age.  *Cerebrum: No hemorrhage, mass or acute infarction.  *White matter: Normal for age.  *Brainstem and cerebellum: Normal for age.  ORBITS: The visualized portion of the orbits demonstrate no acute abnormality.    SINUSES: The visualized paranasal sinuses demonstrate no acute abnormality.  Mastoids are unremarkable.    SOFT TISSUES/SKULL: No acute abnormality of the visualized skull or soft  tissues.      PROBLEMS / DIAGNOSIS / ASSESSMENT PLAN     Admit ZO:XWRUEA Service  Level of care: Floor     55 yo M s/p fall from ladder, no LOC.    Injuries:  Complex R pelvic fracture  R 5th rib fx  R pneumothorax    R pelvic fracture  - Ortho consult  - f/u recs  - active extrav on CT from OSH  - trend Hb    R 5th rib fx  R PTX  - Pain control  - IS  - Monitor sats  - CT to suction  - am CXR    DVT ppx: SQH  Admit to 5NW    Jackelyn Knife, MD  03/04/2016 2:13 AM  Trauma Resident Pagers: Senior: TSEN [phone]) or Junior: Lafe Garin [phone])

## 2016-03-04 NOTE — Unmapped (Signed)
ORTHO POST-OP CHECK    S/p R SI screws, anterior pelvis ex fix    S: No major issues. Glad that surgery went well. Pain controlled. Denies numbness/tingling    Filed Vitals:    03/04/16 1155 03/04/16 1442 03/04/16 1447 03/04/16 1452   BP: 151/78 137/77 127/76 122/74   Pulse: 81 89 81 68   Temp: 98.2 ??F (36.8 ??C) 98.1 ??F (36.7 ??C)     TempSrc: Oral Temporal     Resp: 20 18 13 15    Height: 5' 8 (1.727 m)      Weight: 180 lb (81.647 kg)      SpO2: 99% 100% 100% 100%       NAD  Breathing unlabored    Pelvis - anterior pins in place with bolsters.    Right LE -   Gluteal dressing c/d/i  SILT sp/dp/t/sural/saph bilat  EHL/TA/GSC intact bilat  Toes up and down bilat  Cap refill < 2 sec bilat    A/P:  55 y.o. male s/p Right SI screws, anterior pelvic exfix    IV ABx: ancef post-op x 2 doses  TTWB RLE, WBAT LLE  PT/OT tomorrow  F/u CT pelvis  Secondary when more awake  OR plans complete at this time, pending secondary exam  Anticoagulation:  Lovenox SQ tonight  Dispo planning: pending PT and secondary exam    Shaylon Aden, MD  Ortho Trauma: 1914

## 2016-03-04 NOTE — Unmapped (Signed)
PELVIC RING FIXATION  Procedure Note    AZARION HOVE  03/04/2016      Pre-op Diagnosis: Pelvic ring injury       Post-op Diagnosis: same    Procedure(s):  PELVIC RING FIXATION      Surgeon(s):  Andee Lineman, MD    Anesthesia: General    Staff:   Circulator: Charlynn Grimes  Relief Circulator: Mirian Mo, RN  Relief Scrub: Quincy Simmonds, RN  Scrub Person: Theresa Mulligan, RN  Float: Oley Balm, RN  Resident: Mady Haagensen, MD; Barbarann Ehlers, MD    Estimated Blood Loss: Minimal                 Specimens: * No specimens in log *           Drains:   Chest Tube Right Midaxillary;Fourth intercostal space (Active)   Number of days:       IUC (Foley) (Active)   Number of days:0             There were no complications unless listed below.        Jadien Lehigh     Date: 03/04/2016  Time: 2:28 PM

## 2016-03-05 ENCOUNTER — Inpatient Hospital Stay: Admit: 2016-03-05 | Payer: PRIVATE HEALTH INSURANCE

## 2016-03-05 LAB — CBC
Hematocrit: 21.1 % (ref 38.5–50.0)
Hemoglobin: 7.1 g/dL (ref 13.2–17.1)
MCH: 29.5 pg (ref 27.0–33.0)
MCHC: 33.8 g/dL (ref 32.0–36.0)
MCV: 87.2 fL (ref 80.0–100.0)
MPV: 7.4 fL (ref 7.5–11.5)
Platelets: 213 10*3/uL (ref 140–400)
RBC: 2.42 10*6/uL (ref 4.20–5.80)
RDW: 14 % (ref 11.0–15.0)
WBC: 12.1 10*3/uL (ref 3.8–10.8)

## 2016-03-05 MED ORDER — oxyCODONE (ROXICODONE) immediate release tablet 5 mg
5 | ORAL | Status: AC | PRN
Start: 2016-03-05 — End: 2016-03-07

## 2016-03-05 MED ORDER — enoxaparin (LOVENOX) for prophylaxis syringe 40 mg/0.4 mL
40 | Freq: Every day | SUBCUTANEOUS | Status: AC
Start: 2016-03-05 — End: 2016-03-06
  Administered 2016-03-05: 17:00:00 40 mg via SUBCUTANEOUS

## 2016-03-05 MED ORDER — oxyCODONE (ROXICODONE) immediate release tablet 10 mg
5 | ORAL | Status: AC | PRN
Start: 2016-03-05 — End: 2016-03-07
  Administered 2016-03-05 – 2016-03-07 (×10): 10 mg via ORAL

## 2016-03-05 MED FILL — OXYCODONE 5 MG TABLET: 5 5 MG | ORAL | Qty: 2

## 2016-03-05 MED FILL — TYLENOL 325 MG TABLET: 325 325 mg | ORAL | Qty: 3

## 2016-03-05 MED FILL — HYDROMORPHONE 1 MG/ML INJECTION SYRINGE: 1 1 mg/mL | INTRAMUSCULAR | Qty: 1

## 2016-03-05 MED FILL — ENOXAPARIN 40 MG/0.4 ML SUBCUTANEOUS SYRINGE: 40 40 mg/0.4 mL | SUBCUTANEOUS | Qty: 0.4

## 2016-03-05 MED FILL — LIDODERM 5 % TOPICAL PATCH: 5 5 % | TOPICAL | Qty: 1

## 2016-03-05 MED FILL — IBUPROFEN 600 MG TABLET: 600 600 MG | ORAL | Qty: 1

## 2016-03-05 MED FILL — POLYETHYLENE GLYCOL 3350 17 GRAM ORAL POWDER PACKET: 17 17 gram | ORAL | Qty: 1

## 2016-03-05 MED FILL — SENNA-S 8.6 MG-50 MG TABLET: 8.6-50 8.6-50 mg | ORAL | Qty: 1

## 2016-03-05 MED FILL — FENTANYL CITRATE (PF) 100 MCG/2ML IJ SOLN: 100 MCG/2ML | INTRAMUSCULAR | Qty: 2

## 2016-03-05 MED FILL — KETAMINE HCL 100 MG/ML IJ SOLN: 100 MG/ML | INTRAMUSCULAR | Qty: 10

## 2016-03-05 NOTE — Unmapped (Signed)
Inpatient Physical Therapy   Initial Assessment    Name: Colin [name]  DOB:01-15-1961  Attending Physician: Jeani Hawking, MD  Admitting Diagnosis: Pneumothorax, right [J93.9]  Fall, initial encounter [W19.XXXA]  Fracture of pelvis, unspecified part of pelvis, initial encounter [S32.9XXA]  Fall from roof [W13.2XXA]  Date: 03/05/2016  Room: 4540/J8119    Hospital Course PT/OT: 55 yo M s/p fall from ladder, no LOC. Sustained lateral compression pelvic ring injury with R sup/inf rami fxs, R SI diastasis, R 5th rib fx, R pneumothorax. 7/11: RIGHT SI SCREWS; CLOSED REDUCTION LC I?? PELVIC RING INJURY; PELVIC EXTERNAL FIXATION. XR Pelvis 7/11: Pelvic fractures involving the right superior and inferior pubic rami and pubic body, similar to recent CT. CTL Spine clearence. AAT, TTWB R LE, WBAT L LE.     Activity level: activity as tolerated  Precautions: TTWB R LE, WBAT L LE      Assessment:  Patient presents with impairments including ROM, balance, activity tolerance, bed mobility, transfers and gait.   Pt tolerated therapy session well. Pt requires assistance with bed mobility and transfers. Pt was able to ambulate with RW into the hallway this date and recorded SpO2 was 97% pre ambulation and 96% post ambulation on RA. Pt will benefit from continued therapy this date to improve safety and independence with bed mobility and transfers for safe return home.       Recommendations:   Recommend: At this time pt will benefit from additional skilled therapy. Pt will be re-evaluated 7/13 for updated recommendations for possible home with 24hr assist    AM-PAC 6 Clicks Basic Mobility Inpatient Short Form: PT 6 Clicks Score: 17    Equipment recommendations: rolling walker    This equipment is necessary because Safety with ambulation at home and in the community.   The patient is able to (or demonstrates the skills necessary to) use this equipment safely.      Mobility Recommendations for Staff:  Patient ambulates in room/to  bathroom with 1 person assist requires use of rolling walker and gait belt during activity      Preadmission Environment:  Patient lives alone and has can have 24 hour asssit available at discharge.    [place] 1 4 step to enter.   Walk in shower    Has to navigate 3 flights of stairs for work.    Patient has the following equipment: no assistive device      Prior Level of Function:  Information per patient:  --Patient was using no assistive device for functional mobility.  --Prior to hospital admission the patient needed assistance with nothing.   --Patient received assistance from patient.      Present Cognitive Status:  Patient is oriented X 4.  Patient is alert, appropriate and cooperative.  Patient is able to follow all commands.       Patient Comments:  I want what is best and safe for me to move around      Pain:  Patient reports pain in R ant hip ; no number stated  Pain interventions: repositioned, activity increased and exercise      Vision/Perception:  Patient's vision is [facility]      Upper Extremity Function:  Strength/ROM grossly WFL      Lower Extremity Function:  Strength/ROM grossly WFL  Sensation: normal    Muscle Tone: normal    Motor Control: normal      Edema:   none      Functional Mobility:  Rolling = Patient rolls right with modified independence  Bed mobility = Patient transitions from supine to sit with moderate assistance and ( 2 person assistance )  Sit to stand = Patient transfers from sit to stand with moderate assistance and VC required for TTWB on R LE  Stand to sit = Patient transfers from stand to sit with moderate assistance  Gait = Patient ambulates 25 ft. with minimal assistance using rolling walker and gait belt . Gait deviations include TTWB R LE, increased pain in R LE with swing phase of gait. .      Balance:  Static sitting = performs independently  Dynamic sitting = performs with modified independence  Static standing = performs with contact guard assistance  using rolling walker and gait belt  Dynamic standing = performs with minimal assistance using rolling walker and gait belt      Positioning:  Patient left in chair at end of session, nurse notified and call light/ needs left within reach. Chair alarm not needed-patient is a low fall risk (or a medium fall risk with other precautions in place).       Goals:  To be met by: 03/08/16    Bed mobility = Patient will transition from supine to sit with contact guard assistance  Sit to stand = Patient will transfer from sit to stand with contact guard assistance  Gait = Patient will ambulate 50 ft. with contact guard assistance using appropriate assistive device    Long-term goal (to be met by 03/13/16): Pt will be Mod I with all transfers  Patient stated goals: to go home, to increase activity and to increase independence    Rehabilitation Potential (for above goals): good   Strengths: Motivation, PLOF    Barriers: multiple injuries    Above goals discussed with patient -- Yes.       Patient/Family Education:  Educated patient on the role of physical therapy, goals, plan of care, discharge recommendations, gait training and weight bearing precautions and fall prevention strategies, including need for supervision/ assistance with OOB activity and use of call light; patient verbalized understanding.      Plan:  Patient to be seen at least 3  time(s) per week to address above deficits with therapeutic exercise, functional mobility, therapeutic activity, transfer training, gait training and equipment evaluation/education.    The plan of care and recommendations assesses the patient's and/or caregiver's readiness, willingness, and ability to provide or support functional mobility and ADL tasks as needed upon discharge.      Signed:    Silvestre Moment, Student PT  Pager: [phone]  Department phone:[phone]  Hours: 7:30am-4:00pm Monday through Friday      Patient Class: Inpatient    Start Time: 1107    Stop Time: 1145   Time  Calculation (min): 38 min    Units Rendered:   $PT Evaluation Mod Complex 30 Min: 1 Procedure  $Gait/Mobility: 8-22 mins                PMH: History reviewed. No pertinent past medical history.  PSH:   Past Surgical History   Procedure Laterality Date   ??? Anterior cruciate ligament repair Right    ??? Wisdom tooth extraction     ??? Open reduction  internal fixation acetabulum anterior N/A 03/04/2016     Procedure: PELVIC RING FIXATION;  Surgeon: Andee Lineman, MD;  Location: [facility] OR;  Service: Orthopedics;  Laterality: N/A;

## 2016-03-05 NOTE — Unmapped (Signed)
TRAUMA SERVICE TERTIARY EXAM NOTE  Colin [name]    Patient was admitted on 03/04/2016 following a fall.    Known injuries include:  Complex R pelvic fracture  R 5th rib fx  R pneumothorax    Tertiary exam:  General: well appearing, no acute distress  GCS: 15, no focal deficits  HEENT: scalp w/o abrasions or lacerations, normal bite, no loose or missing teeth  Neck: C spine cleared  Chest/Lungs: R chest tube in place, BS CTA bilaterally,  2500 on IS  Abdomen: soft, non tender, no ecchymosis or abrasions  MSK: ROM normal to extremities, no new pain, no obvious deformities.     Initial imaging reviewed    Tertiary exam complete. No new injuries noted.     03/05/2016 1:23 PM

## 2016-03-05 NOTE — Unmapped (Signed)
ORTHO PROGRESS NOTE    S: Pain adequately controlled. No complaints this AM. Denies numbness/paresthesias    Filed Vitals:    03/04/16 1723 03/04/16 1948 03/05/16 0010 03/05/16 0328   BP: 117/77 117/76 93/61 118/71   Pulse: 90 88 95 101   Temp: 98.2 ??F (36.8 ??C) 98.6 ??F (37 ??C) 98.1 ??F (36.7 ??C) 98.3 ??F (36.8 ??C)   TempSrc: Oral Oral Oral Oral   Resp: 14 14 14 14    Height:       Weight:       SpO2: 95% 97% 99% 100%       NAD  Regular, unlabored breathing      Pelvis - anterior pins in place with bolsters.    Right LE -   Gluteal dressing c/d/i  SILT sp/dp/t/sural/saph b/l  EHL/TA/GSC intact b/l  Toes up and down bilaterally  Cap refill < 2 sec bilat    A/P:  55 y.o. male s/p Right SI screws, anterior pelvic exfix    IV ABx: ancef post-op x 2 doses  TTWB RLE, WBAT LLE  PT/OT  OR plans complete at this time  Anticoagulation:  Lovenox SQ  Dispo planning: pending PT    Colin [name]  Ortho Trauma: [id number]

## 2016-03-05 NOTE — Unmapped (Signed)
Trauma Nurse Clinician Daily Progress Note    Trauma Team multi-disciplinary rounds started at 7:30am.    DOB: [birth year] Age: 55 y.o.  PCP: No Pcp    Diagnosis:   Principal Problem:    Fall from ladder        Insurance:   Insurance Information                [employer] Phone: [phone]    Subscriber: Colin [name], Colin [name] Subscriber#: [id number]    Group#:  Precert#:           Lines and Tubes: right CT to water seal (no air leak present), PIVs    Diet:   Diet Orders          Diet regular starting at 07/11 1826           Bowel Regimen/Last recorded bowel movement: Schd senna and miralax. No BM this admission    DVTProphylaxis/Plan/Duplex: Enoxaparin     PT Recs: Recommendation: Defer at this time, Other (Comment) (At this time pt requires additional rehab. Will re-assess 7/13 for possible home with 24hr assist)  Equipment Recommended: Rolling Walker    OT Recs: Recommendation:  (home w/ 24 hr SPV vs inpt rehab pending progress and further OOB assessment)  Equipment Recommended: Defer at this time    Weight Bearing Status: toe touch weight bearing on right lower extremity: leg and full as tolerated left leg    Spine Brace: N/A    Cognitive Eval:   Score: N/A no LOC    Assessment/Wounds:patient sitting up in chair after being evaluated with PT/OT. Updated by team on care plan, all questions answered. Patient aware that once chest tube removed that discharge pending within 24 hrs. Currently discharge home with 24hr support. Per notes PT/OT to re-eval in am to assess for placement needs.     Discharge plan:TBD based on PT/OT eval in am 7/13    Discussed plan of care and/or discharge plan with patient, family and social work.     Trauma Surgery discharge instructions added/reviewed/updated to/in discharge navigator.    Windell Norfolk BSN, RN  [facility] Trauma Nurse Clinician   (p) [phone]  Trauma Chg RN [phone] (7a-5:30p)

## 2016-03-05 NOTE — Unmapped (Signed)
Occupational Therapy  Initial Assessment    Name: Colin [name]  DOB:11-30-1960  Attending Physician: Jeani Hawking, MD  Admitting Diagnosis: Pneumothorax, right [J93.9]  Fall, initial encounter [W19.XXXA]  Fracture of pelvis, unspecified part of pelvis, initial encounter [S32.9XXA]  Fall from roof [W13.2XXA]  Date: 03/05/2016  Room: 1610/R6045    Hospital Course PT/OT: 55 yo M s/p fall from ladder, no LOC. Sustained lateral compression pelvic ring injury with R sup/inf rami fxs, R SI diastasis, R 5th rib fx, R pneumothorax. 7/11: RIGHT SI SCREWS; CLOSED REDUCTION LC I?? PELVIC RING INJURY; PELVIC EXTERNAL FIXATION. XR Pelvis 7/11: Pelvic fractures involving the right superior and inferior pubic rami and pubic body, similar to recent CT. CTL Spine clearence. AAT, TTWB R LE, WBAT L LE.     Activity Level: activity as tolerated  Precautions: TTWB RLE, WBAT LLE      Recommendations  Recommend: Home with 24 hour assistance/supervision vs inpatient rehab pending progress (will re-assess 7/13 once additional OOB assessed and pain controlled)    AM-PAC 6 Clicks Daily Activity Inpatient Short Form: OT 6 Clicks Score: 19    Equipment recommendations: Defer until further OOB assessed (however if d/c home will need rolling walker and shower chair)       Preadmission Environment  Patient lives alone and has 24 hour assist available at discharge. Girlfriend can provide 24 hr assistance.    Patient [place]. 1 4 step to enter.   Walk in shower    Has to navigate 3 flights of stairs for work. Does maintenance for apartment complex.    Patient has the following equipment: no assistive device      Prior Level of Function/Occupational Profile  Information per patient:  --Patient was using no assistive device for functional mobility.  --Prior to hospital admission the patient needed assistance with nothing.   --Patient received assistance from no one.       Pain  Patient reports pain in R ant hip ; no number stated  Pain  interventions: repositioned, activity increased      Cognitive Status  Patient is oriented X 4.  Patient is alert, appropriate and cooperative.  Patient is able to follow all commands. ??      Cognition/Perception   Functional Impaired Comments   Memory X       Problem Solving  X VC to problem solve use of RW/safe transfers/WB precautions     Judgment  X Decreased safety awareness/knowledege of WB precautions     Insight X       Communication X       Vision X           Upper Extremity Function    Patient's UE function is WFL.      Hand Function Functional Impaired Comments   Gross Grasp X     Coordination X         Muscle Tone: normal    Sensation: Intact    Motor Control: Intact    Splints: none      ADLs and Functional Mobility  Bed Mobility: Moderate assistance x 1 (supine to sit, HOB slightly elevated)  Sit to stand: Moderate assistance x 1 w/ RW and VC for transfer technique and WB status  Chair Transfer: Minimal assistance w/ RW and VC for transfer technique and WB status  UE ADLs: Minimal assistance (donning gown for back, assist with ties/snaps)  Functional Mobility: minimal progressing to contact guard assistance w/ RW  Balance  Static Sitting: Independent  Dynamic Sitting: Independent  Static Standing: Contact guard assistance w/ RW  Dynamic Standing: minimal progressing to contact guard assistance w/ RW      Positioning: Patient left in chair at end of session, nurse notified and call light/ needs left within reach. Chair alarm activated and interfaced with call system.      Assessment  Pt presents with impaired ADLs, balance, functional mobility and activity tolerance. Pt will benefit from skilled OT services.      Goals  To be met in: 1 week 7/19  Patient will complete sit to stand with contact guard assistance and RW, while adhering to WB precautions.  Patient will complete chair transfer with contact guard assistance and RW, while adhering to WB precautions.  Patient will tolerate toilet transfer  assessment.  Patient will tolerate LE ADL's assessment.    Long-term goal: Pt will tolerate bathing assessment (to be met in 2 weeks) 7/26    Patient stated goals: to go home and to increase independence     Rehabilitation Potential (for above goals): good  Patients and/or caregivers as well as practitioners mutually agreed upon the above goals.      Plan  Pt to be seen a minimum of 3 time(s) per week to address above deficits with therapeutic exercise, functional mobility, ADL retraining, balance training, activity tolerance training, therapeutic activity and patient/family education.      Patient/Family Education  Educated patient on the role of occupational therapy, OT goals, OT plan of care, discharge recommendation, functional mobility training, the importance of safety, weight bearing restrictions and use of rolling walker and transfer training and fall prevention strategies including need for supervision/ assistance with OOB activity and use of call light. patient  verbalized understanding and needed cues.      The plan of care assesses the patient's and/or caregiver's readiness, willingness, and ability to provide or support functional mobility and ADL tasks as needed upon discharge.      Shiela Mayer, OTR/L  [facility]  Pager #: [phone]  Office #: [phone]  Hours: Monday-Friday, 8:00-4:30    Patient Class: Inpatient      Time   Start Time: 1107  Stop Time: 1145  Time Calculation (min): 38 min   Charges   $OT Evaluation Mod Complex 45 Min: 1 Procedure   $Therapeutic Activity: 8-22 mins            PMH: History reviewed. No pertinent past medical history.  PSH:   Past Surgical History   Procedure Laterality Date   ??? Anterior cruciate ligament repair Right    ??? Wisdom tooth extraction     ??? Open reduction  internal fixation acetabulum anterior N/A 03/04/2016     Procedure: PELVIC RING FIXATION;  Surgeon: Andee Lineman, MD;  Location: [facility] OR;  Service: Orthopedics;   Laterality: N/A;

## 2016-03-05 NOTE — Unmapped (Signed)
Chest tube dressing changed as ordered.

## 2016-03-05 NOTE — Unmapped (Signed)
[facility] TRAUMA SERVICE PROGRESS NOTE  Colin [name]  Admit date: 03/04/2016   LOS: 1 day     Subjective / Events of Last 24HRS   S/p pelvic ring fixation with ortho yest. Doing well this a.m. Pain controlled. Able to start working with PT/OT totday     Objective   Vitals:  Temp:  [97.5 ??F (36.4 ??C)-98.6 ??F (37 ??C)] 98.3 ??F (36.8 ??C)  Heart Rate:  [62-101] 101  Resp:  [9-20] 14  BP: (92-151)/(56-89) 118/71 mmHg  FiO2:  [60 %-100 %] 100 %  Filed Vitals:    03/05/16 0328   BP: 118/71   Pulse: 101   Temp: 98.3 ??F (36.8 ??C)   Resp: 14   SpO2: 100%         Date 03/04/16 0700 - 03/05/16 0659 03/05/16 0700 - 03/06/16 0659   Shift 0700-1459 1500-2259 2300-0659 24 Hour Total 0700-1459 1500-2259 2300-0659 24 Hour Total   I  N  T  A  K  E   P.O.  300  300          P.O.  300  300        I.V.  (mL/kg) 1000  (12.2)   1000  (12.2)          Volume (mL) (lactated Ringers infusion) 1000   1000        Shift Total  (mL/kg) 1000  (12.2) 300  (3.7)  1300  (15.9)       O  U  T  P  U  T   Urine  (mL/kg/hr) 1070  (1.6) 75  (0.1) 500 1645          Urine 1000   1000          Output (mL) (IUC (Foley)) 70 75 500 645        Chest Tube 15 15 0 30          Output (mL) (Chest Tube Right Midaxillary;Fourth intercostal space) 15 15 0 30        Shift Total  (mL/kg) 1085  (13.3) 90  (1.1) 500  (6.1) 1675  (20.5)       Weight (kg) 81.6 81.6 81.6 81.6 81.6 81.6 81.6 81.6       Physical Exam:  Gen: Cooperative, no acute distress  Neuro: Alert and oriented x4   Eyes: 4  Verbal: 5  Motor: 6   GCS: 15  HEENT: NCAT, PERRL, neck supple  CV: Regular rate and rhythm, normal S1 and S2  Resp: CTAB, no respiratory distress, IS pulling 2500  Abd: Soft, non-distended, non-tender, no masses; pelvic anterior pins in place  Ext: Warm and well perfused    Recent Labs      03/04/16   0137  03/04/16   1917   WBC  24.6*  15.7*   HGB  10.8*  8.9*   HCT  33.0*  26.4*   PLT  278  239     Recent Labs      03/04/16   0137   NA  142   K  5.0   CL  111*   CO2  25   BUN  18    CREATININE  0.95   CALCIUM  8.1*     No results for input(s): POCGLU, POCGMD in the last 72 hours.    Invalid input(s): GLU  Recent Labs      03/04/16   0137   BILITOT  0.3  AST  34   ALT  15   ALKPHOS  43     Recent Labs      03/04/16   0137   INR  1.1   PROTIME  14.6*     No results for input(s): TEGANGLE, TEGKTIME, TEGLYSIS30, TEGRTIME, CBMZ in the last 72 hours.    Invalid input(s): TEGMAXAMPLE  Recent Labs      03/04/16   0137   LACTATE  1.9       Current Medications:  Scheduled Medications:    acetaminophen 975 mg 3 times per day   Or     acetaminophen 975 mg 3 times per day   ibuprofen 600 mg TID WC   lidocaine 1 patch Daily 0900   polyethylene glycol 17 g BID   senna-docusate 1 tablet BID     IV Medications:    lactated Ringers Last Rate: 100 mL/hr (03/05/16 0515)     PRN Medications:    HYDROmorphone 0.5 mg Q3H PRN   Or     HYDROmorphone 1 mg Q3H PRN       Imaging:  Fluoro Up To 1 Hour    03/04/2016  Intraoperative fluoroscopy performed Technique : Intraoperative fluoroscopy was performed without a radiologist present. History:  ORIF Findings: Fluoroscopy was performed without a radiologist present.   A total of 84.2 seconds of flouro were utilized during the procedure. 5 spot images have been provided. The images are not labeled for right or left side. There has been placement of 2 partially threaded cannulated screws bridging the sacroiliac joint, presumably the right. Comminuted right obturator ring fractures are noted. An external fixator screw is noted in the right supra-acetabular ilium. There is asymmetry of the pubic symphysis with slight superior position of the left pubic body.     03/04/2016  IMPRESSION: Intraoperative fluoroscopic findings as described above correlate with procedural note for additional information.  Report Verified by: Baird Cancer, M.D. at 03/04/2016 2:50 PM EDT    X-ray Pelvis 1 Or 2-views    03/04/2016  EXAM: XR PELVIS 1 OR 2-VIEWS dated 03/04/2016 7:00 AM EDT CLINICAL  HISTORY: Evaluate for fracture, pain COMPARISON: CT abdomen pelvis from outside institution 03/03/2016 FINDINGS: 5 views are provided. Comminuted displaced fractures of the right superior and inferior pubic rami with extension into the pubic body are not partially changed from recent CT. Incongruity of the pubic symphysis persists. No new fracture identified.     03/04/2016  IMPRESSION: Pelvic fractures involving the right superior and inferior pubic rami and pubic body, similar to recent CT. Approved by Leretha Pol on 03/04/2016 7:25 AM EDT I have personally reviewed the images and I agree with this report. Report Verified by: Etheleen Nicks at 03/04/2016 7:37 AM EDT    X-ray Pelvis Minimum 3-views    03/04/2016  Intraoperative fluoroscopy performed Technique : Intraoperative fluoroscopy was performed without a radiologist present. History:  ORIF Findings: Fluoroscopy was performed without a radiologist present.   A total of 84.2 seconds of flouro were utilized during the procedure. 5 spot images have been provided. The images are not labeled for right or left side. There has been placement of 2 partially threaded cannulated screws bridging the sacroiliac joint, presumably the right. Comminuted right obturator ring fractures are noted. An external fixator screw is noted in the right supra-acetabular ilium. There is asymmetry of the pubic symphysis with slight superior position of the left pubic body.     03/04/2016  IMPRESSION: Intraoperative fluoroscopic findings as  described above correlate with procedural note for additional information.  Report Verified by: Baird Cancer, M.D. at 03/04/2016 2:50 PM EDT      Assessment / Plan   Colin [name] is a 55 y.o. male admitted 03/04/2016 s/p Fall    Injuries:  Complex R pelvic fracture  R 5th rib fx  R pneumothorax    R pelvic fracture  - Ortho consulted  - s/p pelvic ring fixation  - PT/OT  - TTWB RLE, WBAT LLE    R 5th rib fx  R PTX  - Pain control  - IS (pulling 2500)  -  Monitor sats  - CT to suction  - f/u am CXR    DVT ppx: Lovenox  Admit to [facility]    Colin Knife, MD  03/05/2016 6:08 AM  Trauma Resident Pagers: Senior: TSEN [phone]) or Junior: Lafe Garin [phone])

## 2016-03-05 NOTE — Unmapped (Signed)
Anesthesia Post Note    Patient: Colin [name]    Procedure(s) Performed: Procedure(s):  PELVIC RING FIXATION    Anesthesia type: general endotracheal    Patient location: [facility] Floor    Post pain: Adequate analgesia    Post assessment: no apparent anesthetic complications, tolerated procedure well, no evidence of recall, able to flex hips and bend knees bilateral and voided    Last Vitals:   Filed Vitals:    03/04/16 1948 03/05/16 0010 03/05/16 0328 03/05/16 0730   BP: 117/76 93/61 118/71 108/64   Pulse: 88 95 101 92   Temp: 98.6 ??F (37 ??C) 98.1 ??F (36.7 ??C) 98.3 ??F (36.8 ??C) 98 ??F (36.7 ??C)   TempSrc: Oral Oral Oral Oral   Resp: 14 14 14 16    Height:       Weight:       SpO2: 97% 99% 100% 98%        Post vital signs: stable    Level of consciousness: awake, alert  and oriented    Complications: None

## 2016-03-06 ENCOUNTER — Inpatient Hospital Stay: Admit: 2016-03-06 | Payer: PRIVATE HEALTH INSURANCE

## 2016-03-06 LAB — CBC
Hematocrit: 21.4 % (ref 38.5–50.0)
Hematocrit: 21.5 % (ref 38.5–50.0)
Hematocrit: 21.9 % (ref 38.5–50.0)
Hemoglobin: 7.2 g/dL (ref 13.2–17.1)
Hemoglobin: 7.4 g/dL (ref 13.2–17.1)
Hemoglobin: 7.6 g/dL (ref 13.2–17.1)
MCH: 30 pg (ref 27.0–33.0)
MCH: 30.1 pg (ref 27.0–33.0)
MCH: 30.5 pg (ref 27.0–33.0)
MCHC: 33.9 g/dL (ref 32.0–36.0)
MCHC: 34.4 g/dL (ref 32.0–36.0)
MCHC: 34.9 g/dL (ref 32.0–36.0)
MCV: 87.5 fL (ref 80.0–100.0)
MCV: 87.7 fL (ref 80.0–100.0)
MCV: 88.5 fL (ref 80.0–100.0)
MPV: 7.1 fL (ref 7.5–11.5)
MPV: 7.1 fL (ref 7.5–11.5)
MPV: 7.4 fL (ref 7.5–11.5)
Platelets: 220 10*3/uL (ref 140–400)
Platelets: 226 10*3/uL (ref 140–400)
Platelets: 253 10*3/uL (ref 140–400)
RBC: 2.41 10*6/uL (ref 4.20–5.80)
RBC: 2.45 10*6/uL (ref 4.20–5.80)
RBC: 2.5 10*6/uL (ref 4.20–5.80)
RDW: 13.8 % (ref 11.0–15.0)
RDW: 14.2 % (ref 11.0–15.0)
RDW: 14.3 % (ref 11.0–15.0)
WBC: 8.2 10*3/uL (ref 3.8–10.8)
WBC: 9.5 10*3/uL (ref 3.8–10.8)
WBC: 9.6 10*3/uL (ref 3.8–10.8)

## 2016-03-06 MED ORDER — acetaminophen (TYLENOL) 325 MG tablet
325 | ORAL_TABLET | Freq: Three times a day (TID) | ORAL | 0.00 refills | 11.00000 days | Status: AC | PRN
Start: 2016-03-06 — End: 2016-03-07

## 2016-03-06 MED ORDER — ibuprofen (ADVIL,MOTRIN) 600 MG tablet
600 | ORAL_TABLET | Freq: Three times a day (TID) | ORAL | Status: AC
Start: 2016-03-06 — End: 2016-03-07

## 2016-03-06 MED ORDER — senna-docusate (SENNA-S) 8.6-50 mg per tablet
8.6-50 | ORAL_TABLET | Freq: Two times a day (BID) | ORAL | Status: AC
Start: 2016-03-06 — End: 2016-03-07

## 2016-03-06 MED ORDER — oxyCODONE (ROXICODONE) 5 MG immediate release tablet
5 | ORAL_TABLET | ORAL | 0.00 refills | 6.00000 days | Status: AC | PRN
Start: 2016-03-06 — End: 2016-03-07
  Filled 2016-03-07: qty 45, 4d supply, fill #0

## 2016-03-06 MED ORDER — polyethylene glycol (MIRALAX) 17 gram packet
17 | PACK | Freq: Two times a day (BID) | ORAL | Status: AC
Start: 2016-03-06 — End: 2016-03-07

## 2016-03-06 MED ORDER — enoxaparin (LOVENOX) for prophylaxis syringe 30 mg/0.3 mL
30 | Freq: Two times a day (BID) | SUBCUTANEOUS | Status: AC
Start: 2016-03-06 — End: 2016-03-07
  Administered 2016-03-06 – 2016-03-07 (×3): 30 mg via SUBCUTANEOUS

## 2016-03-06 MED FILL — ENOXAPARIN 40 MG/0.4 ML SUBCUTANEOUS SYRINGE: 40 40 mg/0.4 mL | SUBCUTANEOUS | Qty: 0.4

## 2016-03-06 MED FILL — OXYCODONE 5 MG TABLET: 5 5 MG | ORAL | Qty: 2

## 2016-03-06 MED FILL — SENNA-S 8.6 MG-50 MG TABLET: 8.6-50 8.6-50 mg | ORAL | Qty: 1

## 2016-03-06 MED FILL — HYDROMORPHONE 1 MG/ML INJECTION SYRINGE: 1 1 mg/mL | INTRAMUSCULAR | Qty: 1

## 2016-03-06 MED FILL — LIDODERM 5 % TOPICAL PATCH: 5 5 % | TOPICAL | Qty: 1

## 2016-03-06 MED FILL — TYLENOL 325 MG TABLET: 325 325 mg | ORAL | Qty: 3

## 2016-03-06 MED FILL — IBUPROFEN 600 MG TABLET: 600 600 MG | ORAL | Qty: 1

## 2016-03-06 MED FILL — POLYETHYLENE GLYCOL 3350 17 GRAM ORAL POWDER PACKET: 17 17 gram | ORAL | Qty: 1

## 2016-03-06 MED FILL — ENOXAPARIN 30 MG/0.3 ML SUBCUTANEOUS SYRINGE: 30 30 mg/0.3 mL | SUBCUTANEOUS | Qty: 0.3

## 2016-03-06 NOTE — Unmapped (Signed)
[place]  Inpatient Surgery Discharge Summary     Patient ID:  Colin [name] 1961/06/10  ZOX:[id number]  [id number]    Admit Service: Trauma Surgery  Admit date: 03/04/2016  Discharge date and time: 03/07/2016 8:53 AM  Admitting Physician: Jeani Hawking, MD   Discharge Physician: Jeani Hawking, MD  Admission Condition: fair  Discharged Condition: good    Indication for Admission:   Patient is a 55 yo M who fell from a ladder approximately 17 ft above ground on 7/10 while painting at home. He states when he fell, he landed on his right side and hit his hip, chest, and right wrist. He denied LOC and was accompanied by his friend. He was taken to an outside hospital and underwent CT abdomen, chest, pelvis, and c-spine. These showed R rib fractures and a pneumothorax for which he received a R chest tube and a right pelvic fracture. Given his mechanism and injuries, he was stabilized and transferred to [facility] for further care.     Primary Admission Diagnosis:   Pneumothorax, right [J93.9]  Fall, initial encounter [W19.XXXA]  Fracture of pelvis, unspecified part of pelvis, initial encounter [S32.9XXA]  Fall from roof [W13.2XXA]    Secondary Admission Diagnoses / Past Medical Problems:  Patient Active Problem List   Diagnosis   ??? Fall from ladder   ??? Traumatic right pneumothorax   ??? Fracture of one rib of right side   ??? Complex R pelvic fracture- lateral compression pelvic ring injury with R sup/inf rami fxs, R SI diastasis       Discharge New Diagnoses/Injuries:   Right pneumothorax  Pelvic ring injury with R superior/inferior rami fractures and SI diastasis  Right 5th rib fracture    Operations/Procedures Performed:  1. Right SI screws adn anterior pelvic exfix - by Dr. Cloyde Reams on 03/04/16     Consultants:   Orthopaedics    Brief Hospital Course:   As stated above, patient is a 55 yo M who fell from a ladder approximately 17 ft above ground on 7/10 while painting at home and sustained R rib fractures and a  pneumothorax for which he received a R chest tube; and a right pelvic fracture.     R rib fractures:  Pt received multimodal pain control and worked with incentive spirometry. He was weaned to room air prior to discharge and pulled on his IS. Pain was adequately controlled with oral pain regimen.    R pneumothorax  R chest tube was placed. Initially on - . After 24 hours on suction and no air leak, chest tube was placed to water seal. CXR on 03/06/16 demonstrated no evidence of pneumothorax. Chest tube was removed on 03/06/2016 and he tolerated this well.  DC home on RA with Trauma clinic follow up .     R SI joint dx and R parasympheal pubic body fracture  Repair by orthopedic surgery on 03/04/16. See dictated operative report for full details. Pt tolerated the procedure well and was transferred to the floor post op. He received 2 doses of post op antibiotics. Pt is toe touch weight bearing on RLE and weight bearing as tolerated with LLE.    Disposition: Home with supervision      Patient Instructions:   Allergies: No Known Allergies       Medication List      TAKE these medications, which are NEW       Quantity/Refills    acetaminophen 325 MG tablet   Commonly known as:  TYLENOL   Take 3 tablets (975 mg total) by mouth every 8 hours as needed for Pain.    Quantity:  126 tablet   Refills:  0       ibuprofen 600 MG tablet   Commonly known as:  ADVIL,MOTRIN   Take 1 tablet (600 mg total) by mouth 3 times a day with meals for 3 days.    Quantity:  9 tablet   Refills:  0       oxyCODONE 5 MG immediate release tablet   Commonly known as:  ROXICODONE   Take 1-2 tablets (5-10 mg total) by mouth every 4 hours as needed.    Quantity:  45 tablet   Refills:  0       polyethylene glycol 17 gram packet   Commonly known as:  MIRALAX   Take 17 g by mouth 2 times a day.    Quantity:  14 packet   Refills:  0       senna-docusate 8.6-50 mg per tablet   Commonly known as:  SENNA-S   Take 1 tablet by mouth 2 times a day.     Quantity:  30 tablet   Refills:  0            Where to Get Your Medications      You can get these medications from any pharmacy     Bring a paper prescription for each of these medications    - acetaminophen 325 MG tablet  - ibuprofen 600 MG tablet  - oxyCODONE 5 MG immediate release tablet  - polyethylene glycol 17 gram packet  - senna-docusate 8.6-50 mg per tablet              Discharge specific orders:  RESPIRATORY:?? Incentive spirometer ten times per hour while awake.  BLADDER:?? Monitor for urinary retention, urinary incontinence and outputs daily  Monitor for regular bowel movements  WOUND CARE:  [x]  leave sutures/staples/steri-strips in place until follow up visit: no lotions, creams or ointments, keep clean and dry. No soaking till Ortho MD gives the ok. ??  [x] keep chest tube dressing in place for 48 hours, remove the morning of 03/09/2016   [x] Assess pin sites for s/s of infections. Keep ex fix Kerlix bolsters clean, dry and intact. Change as needed only if the bolsters become saturated or begin to fall off. Any questions about your ex-fix please contact Ortho Hotline at [phone]    [facility] ORTHOTICS: ??  Activity as tolerated  Out of bed to chair three times a day as tolerated  May sponge bathe only ??    Follow-up:  Future Appointments  Date Time Provider Department Center   03/17/2016 10:40 AM Andee Lineman, MD [facility]   03/21/2016 9:15 AM [facility]G TRA[facility]     Surgery Trauma Clinic  [address], Outpatient Building  2nd Floor  [place] [address]  [phone]  On 03/21/2016  at 9:30am, arrive by 9:00am to check into the clinic    Andee Lineman, MD  [address]  [place] [address]  [phone]    On 03/17/2016  please arrive at 10:10 am for a 10:40 am appointment for your pelvis           Signed:    Rennie Plowman [facility], CNP  03/07/2016 8:53 AM     Jola Baptist, CNP  [facility]  Division of Trauma, Surgical Critical Care, and  Acute Care  Surgery  Pager: [phone]  Office: [phone]  [facility] Nurse Practitioner: [phone]

## 2016-03-06 NOTE — Unmapped (Signed)
[place]   Social Work Psychosocial Assessment     Colin [name]  [id number]  55 y.o.  male  White or Caucasian  Marital Status: Divorced    Pneumothorax, right [J93.9]  Fall, initial encounter [W19.XXXA]  Fracture of pelvis, unspecified part of pelvis, initial encounter [S32.9XXA]  Fall from roof [W13.2XXA]    Referred by: Treatment Team  Referred Reason: Assessment/Discharge Planning      History    History reviewed. No pertinent past medical history.    History   Drug Use   ??? Yes   ??? Special: Marijuana     Comment: last use couple weeks ago       History   Alcohol Use   ??? Yes     Comment: occasionally       Mental Health History: none reported    Mental Status    Current Mental Status: Awake, Oriented to Person, Oriented to Place, Oriented to Time, Oriented to Situation     Activities of Daily Living: Independent       Current Living Arrangements    Current Living Arrangements: With Family  Type of Living Arrangement: Home/Apartment       Support Systems    Next of Kin/Contact Person: Colin [name]  Next of Kin Relationship:  Father  Next of Kin Phone Number: [phone]    [employer] Used Prior to Admission: No    Cultural/Spiritual/Language Barriers    None    Other Pertinent Data    Case Production designer, theatre/television/film for Mental Health IssuesPrior to Admission: No          Durable Medical Equipment Prior to Admission: No    Assessment/Plan    Patient is a 55 year old, Caucasian, male. Patient admitted due to a fall. SW met with patient at bedside, explained role, and provided contact information. SW reviewed recommendations for home pt/ot, rolling walker, and 3-n-1 Commode. Patient agreeable with plan of care. Patient reported that he had no preference for home health care agency. Patient reported that he would need DME. SW completed referrals and awaiting responses.     Patient/Family aware and taking part in the discharge plan.  Patient and family were offered a post-acute provider list as applicable to  the discharge plan and insurance provider.  Patient and family were given the freedom to choose providers and financial interest(s) were disclosed as appropriate.    This assessment has been reviewed with the multi-disciplinary team.    Paris Lore, LSW  [phone]

## 2016-03-06 NOTE — Unmapped (Addendum)
ORTHO PROGRESS NOTE    S: Pain adequately controlled. No complaints this AM. Denies numbness/paresthesias in RLE.    Filed Vitals:    03/05/16 1617 03/05/16 2004 03/05/16 2350 03/06/16 0339   BP: 137/73 121/62 118/86 132/80   Pulse: 105 78 99 94   Temp: 97.4 ??F (36.3 ??C) 98.2 ??F (36.8 ??C) 98.4 ??F (36.9 ??C) 98.3 ??F (36.8 ??C)   TempSrc: Oral Oral Oral Oral   Resp: 16 16 16 16    Height:       Weight:       SpO2: 98% 97% 99% 96%       NAD  Regular, unlabored breathing      Pelvis - anterior pins in place with bolsters    Right LE -   Gluteal dressing c/d/i  SILT sp/dp/t/sural/saph b/l  EHL/TA/GSC intact b/l  Toes up and down b/l  Cap refill < 2 sec b/l    Secondary exam negative    A/P:  55 y.o. male s/p R SI screws, anterior pelvic exfix    IV ABx: ancef post-op x 2 doses  TTWB RLE, WBAT LLE  PT/OT: eval and treat  OR plans: complete at this time  Anticoagulation: per primary  Dispo planning: per primary      Colin [name] ANN Niyana Chesbro, MD  Ortho Trauma: 1610

## 2016-03-06 NOTE — Unmapped (Signed)
RN has verified Cardiac Monitoring Unit orders. James Gillis RN

## 2016-03-06 NOTE — Unmapped (Signed)
Inpatient Physical Therapy   Treatment Note    Name: Colin [name]  DOB:1960-11-18  Attending Physician: Jeani Hawking, MD  Admitting Diagnosis: Pneumothorax, right [J93.9]  Fall, initial encounter [W19.XXXA]  Fracture of pelvis, unspecified part of pelvis, initial encounter [S32.9XXA]  Fall from roof [W13.2XXA]  Date: 03/06/2016  Room: 1610/R6045      Hospital Course PT/OT: 55 yo M s/p fall from ladder, no LOC. Sustained lateral compression pelvic ring injury with R sup/inf rami fxs, R SI diastasis, R 5th rib fx, R pneumothorax. 7/11: RIGHT SI SCREWS; CLOSED REDUCTION LC I?? PELVIC RING INJURY; PELVIC EXTERNAL FIXATION. XR Pelvis 7/11: Pelvic fractures involving the right superior and inferior pubic rami and pubic body, similar to recent CT. CTL Spine clearence. AAT, TTWB R LE, WBAT L LE.     Activity level: activity as tolerated   Precautions: TTWB R LE, WBAT L LE      Assessment:  Pt tolerated therapy session well and has progressed torwards goals well. Pt is able to ambulate with RW with Mod I. Pt is able to transfer from sit to stand from chair with supervision and requires min cures for proper technique and to maintain WB status. Pt's recs have been updated to Home with initial 24hr assist due to Pt demonstrating safety with transfers and functional mobility.       Recommendations:   Recommend: home with prn assistance additional therapy per MD recomendations .    AM-PAC 6 Clicks Basic Mobility Inpatient Short Form: PT 6 Clicks Score: 21    Equipment recommendations: rolling walker  This equipment is necessary because Safety with ambulating at home and in the community to follow WB restrictions with R LE  The patient is able to (or demonstrates the skills necessary to) use this equipment safely      Mobility Recommendations for Staff:  Patient ambulates in hallway with 1 person assist requires use of rolling walker during activity      Present Cognitive Status:  Patient is oriented [facility]).  Patient is alert,  appropriate and cooperative.  Patient is able to follow all commands.       Patient Comments:  I feel like im getting better      Pain:  Patient reports pain in Post R hip ; no number stated  Pain interventions: repositioned, activity increased and exercise      Treatment:  Functional Mobility:  Bed mobility = Patient transitions from supine to sit with minimal assistance and with LE mobility  Sit to stand = Patient transfers from sit to stand with supervision  Stand to sit = Patient transfers from stand to sit with supervision  Gait = Patient ambulates 50 ft. with supervision using rolling walker . Gait deviations include antalgic gait, TTWB R LE.    Balance:  Static sitting = performs with modified independence  Dynamic sitting = performs with modified independence  Static standing = performs with modified independence using rolling walker  Dynamic standing = performs with supervision using rolling walker      Positioning:  Patient left in bed at end of session, call light/ needs left within reach. Bed alarm activated and interface unchanged from previous setting.       Goals:  Patient met sit to stand and gait goal(s) this date.   Updated/active goals below:    To be met by: 03/13/16    Bed mobility = Patient will transition from supine to sit with contact guard assistance  Sit to stand =  Patient will transfer from sit to stand with Mod I  Gait = Patient will ambulate 50 ft. with Mod I using appropriate assistive device    Long-term goal (to be met by 03/18/16): Pt will be able to ambulate 150' with appropriate AD Mod I  Patient stated goals: to go home, to increase activity and to increase independence    Rehabilitation Potential (for above goals): good  ????????????????????????Strengths: Motivation, PLOF  ????????????????????????????????????????????????Barriers: multiple injuries    Above goals discussed with patient -- Yes.       Patient/Family Education:  Educated patient on the role of physical therapy, goals, plan of care, discharge  recommendations, gait training and weight bearing precautions and fall prevention strategies, including need for supervision/ assistance with OOB activity and use of call light; patient verbalized understanding.      Plan:  Continue plan of care, at least 3 time(s) per week.     The plan of care and recommendations assesses the patient's and/or caregiver's readiness, willingness, and ability to provide or support functional mobility and ADL tasks as needed upon discharge.      Signed:    Silvestre Moment, Student PT  Pager: [phone]  Department phone:[phone]  Hours: 7:30am-4:00pm Monday through Friday      Patient class: Inpatient    Start Time: 1258    Stop Time: 1325   Time Calculation (min): 27 min    Units Rendered:      $Gait/Mobility: 8-22 mins  $Therapeutic Activity: 1 unit                PMH: History reviewed. No pertinent past medical history.  PSH:   Past Surgical History   Procedure Laterality Date   ??? Anterior cruciate ligament repair Right    ??? Wisdom tooth extraction     ??? Open reduction  internal fixation acetabulum anterior N/A 03/04/2016     Procedure: PELVIC RING FIXATION;  Surgeon: Andee Lineman, MD;  [facility];  Service: Orthopedics;  Laterality: N/A;

## 2016-03-06 NOTE — Unmapped (Signed)
Occupational Therapy  Progress Note    Name: OBIE KALLENBACH  DOB:1961/05/24  Attending Physician: Jeani Hawking, MD  Admitting Diagnosis: Pneumothorax, right [J93.9]  Fall, initial encounter [W19.XXXA]  Fracture of pelvis, unspecified part of pelvis, initial encounter [S32.9XXA]  Fall from roof [W13.2XXA]  Date: 03/06/2016  Room: 1610/R6045    Hospital Course PT/OT: 55 yo M s/p fall from ladder, no LOC. Sustained lateral compression pelvic ring injury with R sup/inf rami fxs, R SI diastasis, R 5th rib fx, R pneumothorax. 7/11: RIGHT SI SCREWS; CLOSED REDUCTION LC I?? PELVIC RING INJURY; PELVIC EXTERNAL FIXATION. XR Pelvis 7/11: Pelvic fractures involving the right superior and inferior pubic rami and pubic body, similar to recent CT. CTL Spine clearence. AAT, TTWB R LE, WBAT L LE.        Activity Level: activity as tolerated  Precautions: TTWB RLE, WBAT LLE      Recommendations  Recommend: Home with 24 hour supervision/assistance    AM-PAC 6 Clicks Daily Activity Inpatient Short Form: OT 6 Clicks Score: 19     Equipment recommendations: 3 in 1 commode    This equipment is necessary because pt is TTWB RLE and WBAT LLE, requires 3 in 1 commode for safe toilet/shower transfers.  The patient demonstrated the skills necessary to use this piece of equipment safely.     Cognitive Status  Patient is oriented to [facility]).  Patient is alert, appropriate, cooperative and motivated.  Patient is able to follow all commands.      ADLs and Functional Mobility  Bed Mobility: Minimal assistance (supine to sit, HOB slightly elevated, assist with LLE, good trunk control sitting up this date)  Sit to stand: Minimal assistance w/ RW and VC WB precautions  Chair Transfer: Contact guard assistance w/ RW and VC WB precautions  Toilet Transfer: Moderate assistance (min assist getting on toilet, mod assist getting off, grab bar on right side, RW, VC WB precautions- has sink on right side at home)  Feeding: Independent  Grooming: Independent  (seated in chair)  LE ADLs: Total assistance (adjusting b/l socks supine in bed)  Functional Mobility: Supervision w/ RW      Balance  Static Sitting: Independent  Dynamic Sitting: Independent  Static Standing: Supervision w/ RW  Dynamic Standing: minimal assist for transfers, Supervision functional mobility w/ RW      Pain  Patient reports pain in pelvis ; no number stated.  Pain interventions: repositioned and activity increased      Positioning  Patient left in chair at end of session, call light/ needs left within reach. Chair alarm activated and interface unchanged from previous setting.      Assessment  Pt tolerated treatment well. Noted progress with bed mobility/functional transfers/functional mobility this date, d/c recommendation set at home with 24 hour assistance/supervision and needed OT DME. Tolerated toilet transfer assessment this date, overall moderate assist for transfer; use of grab bars and VC for WB precautions, noted pt attempting to put additional weight RLE during toilet transfer (otherwise able to maintain WB precautions remainder of session). Will continue to benefit from OT services until maximum independence is reached.       Goals  To be met in: 1 week 7/19  Patient will complete sit to stand with contact guard assistance and RW, while adhering to WB precautions. Not met, progressing  Patient will complete chair transfer with  Supervision and RW, while adhering to WB precautions. Goal met, updated 7/13  Patient will tolerate toilet transfer assessment. Goal  met  New toilet transfer goal 7/13: Pt will complete toilet transfer with supervision using DME as needed and adhering to WB precautions.   Patient will tolerate LE ADL's assessment. N/a this date    Long-term goal: Pt will tolerate bathing assessment (to be met in 2 weeks) 7/26 n/a this date    Patient stated goals: to go home and to increase independence     Rehabilitation Potential (for above goals): good  Patients and/or  caregivers as well as practitioners mutually agreed upon the above goals.      Plan  Pt to be seen a minimum of 3 time(s) per week.        Patient/Family Education  Educated patient on the role of occupational therapy, OT goals, OT plan of care, discharge recommendation, functional mobility training, the importance of safety, weight bearing restrictions and transfer training and fall prevention strategies including need for supervision/ assistance with OOB activity and use of call light. patient  verbalized understanding and needed cues.      The plan of care assesses the patient's and/or caregiver's readiness, willingness, and ability to provide or support functional mobility and ADL tasks as needed upon discharge.      Shiela Mayer, OTR/L  [facility]  Pager #: [phone]  Office #: [phone]  Hours: Monday-Friday, 8:00-4:30      Patient Class: Inpatient      Time   Start Time: 1114  Stop Time: 1153  Time Calculation (min): 39 min   Charges       $Therapeutic Activity: 8-22 mins  $Self Care/ADL/Home Management Training: 23-37 mins          PMH: History reviewed. No pertinent past medical history.  PSH:   Past Surgical History   Procedure Laterality Date   ??? Anterior cruciate ligament repair Right    ??? Wisdom tooth extraction     ??? Open reduction  internal fixation acetabulum anterior N/A 03/04/2016     Procedure: PELVIC RING FIXATION;  Surgeon: Andee Lineman, MD;  [facility];  Service: Orthopedics;  Laterality: N/A;

## 2016-03-06 NOTE — Unmapped (Signed)
Discharge Planning Assistant Summary     Patient name: Colin [name]                                        Patient MRN: [id number]  DOB: 21-Jan-1961                              Age: 55 y.o.              Gender: male  Patient emergency contact: Extended Emergency Contact Information  Primary Emergency Contact: Jaylynn, [place], [place] [address] [place]  Home Phone: [phone]  Mobile Phone: [phone]  Relation: Father  Preferred language: English  Interpreter needed? No      Attending provider: Jeani Hawking, MD  Primary care physician: No Pcp    The MD has indicated that the patient is ready for discharge.  [name] was referred and accepted at [facility] [phone] for home pt/ot and skilled nursing. Patient equipment delivered at bedside through [facility] [phone].  The patient will be transported home by family.     Discharge Planning Assistant met with patient at bedside and discussed home healthcare plan.  Patient was given agency contact information and encouraged to contact the home health agency within 24-48 hours if they have not been contacted after discharge. Documentation and Home Healthcare orders have been uploaded and faxed via [facility] to appropriate agencies.  DCPA also confirmed receipt of orders by home care agency.  Home healthcare information has also been included on patient???s discharge instructions.     The plan has been reviewed:     No further DCPA needs.    This plan has been reviewed with the multi-disciplinary team.     Cheri Fowler, MSW, LSW   [phone]

## 2016-03-06 NOTE — Unmapped (Signed)
[facility] TRAUMA SERVICE PROGRESS NOTE  Colin [name]  Admit date: 03/04/2016   LOS: 2 days     Subjective / Events of Last 24HRS   Doing ok this am   Complaining of scrotal edema and eccymosis  CT to H2O seal yesterday    Objective   Vitals:  Temp:  [97.4 ??F (36.3 ??C)-98.4 ??F (36.9 ??C)] 98.3 ??F (36.8 ??C)  Heart Rate:  [78-105] 94  Resp:  [14-16] 16  BP: (108-137)/(62-86) 132/80 mmHg  Filed Vitals:    03/06/16 0339   BP: 132/80   Pulse: 94   Temp: 98.3 ??F (36.8 ??C)   Resp: 16   SpO2: 96%         Date 03/05/16 0700 - 03/06/16 0659 03/06/16 0700 - 03/07/16 0659   Shift 0700-1459 1500-2259 2300-0659 24 Hour Total 0700-1459 1500-2259 2300-0659 24 Hour Total   I  N  T  A  K  E   P.O. 480   480          P.O. 480   480        I.V.  (mL/kg) 1256  (15.4)   1256  (15.4)          I.V. 1256   1256        Shift Total  (mL/kg) 1736  (21.3)   1736  (21.3)       O  U  T  P  U  T   Urine  (mL/kg/hr) 400  (0.6) 400  (0.6) 150 950          Urine   150 150          Output (mL) ([REMOVED] IUC (Foley)) 400 400  800        Stool              Stool Occurrence 0 x   0 x        Chest Tube 8  15 23           Output (mL) (Chest Tube Right Midaxillary;Fourth intercostal space) 8  15 23         Shift Total  (mL/kg) 408  (5) 400  (4.9) 165  (2) 973  (11.9)       Weight (kg) 81.6 81.6 81.6 81.6 81.6 81.6 81.6 81.6       Physical Exam:  Gen: Cooperative, no acute distress  Neuro: Alert and oriented x4, GCS 15  HEENT: NCAT, PERRL, neck supple  CV: Regular rate and rhythm, normal S1 and S2  Resp: CTAB, no respiratory distress, IS pulling 2500, on RA  Abd: Soft, non-distended, non-tender, no masses; pelvic anterior external fixator  Ext: Warm and well perfused, neurovascularly intact      Recent Labs      03/04/16   1917  03/05/16   0741  03/05/16   2002   WBC  15.7*  12.1*  9.6   HGB  8.9*  7.1*  7.2*   HCT  26.4*  21.1*  21.4*   PLT  239  213  220     Recent Labs      03/04/16   0137   NA  142   K  5.0   CL  111*   CO2  25   BUN  18   CREATININE  0.95    CALCIUM  8.1*       Recent Labs      03/04/16   0137   BILITOT  0.3   AST  34   ALT  15   ALKPHOS  43     Recent Labs      03/04/16   0137   INR  1.1   PROTIME  14.6*       Recent Labs      03/04/16   0137   LACTATE  1.9       Current Medications:  Scheduled Medications:    acetaminophen 975 mg 3 times per day   enoxaparin 40 mg Daily 0900   ibuprofen 600 mg TID WC   lidocaine 1 patch Daily 0900   polyethylene glycol 17 g BID   senna-docusate 1 tablet BID     IV Medications:     PRN Medications:    HYDROmorphone 0.5 mg Q3H PRN   Or     HYDROmorphone 1 mg Q3H PRN   oxyCODONE 5 mg Q4H PRN   Or     oxyCODONE 10 mg Q4H PRN       Imaging:  X-ray Portable Chest    03/05/2016  Exam: XR PORTABLE CHEST Date: 03/05/2016 6:54 AM EDT CLINICAL HISTORY:  R ptx TECHNIQUE: AP portable view the chest COMPARISON: AP portable view the chest from one day prior. FINDINGS: Medical Devices: Right-sided chest tube with tip projecting in the right apex. Chest tube is kinked, unchanged from prior. No significant interval change in position. Subcutaneous air over right chest wall improved from prior exam. Heart and Mediastinum: Cardiomediastinal silhouette is within normal limits. Lungs and Pleura: No evidence for pneumothorax. Interval improvement of right upper lobe opacity, suggestive of resolving lung contusion. No evidence for pleural effusions. Bones: No acute osseous abnormalities.     03/05/2016  IMPRESSION: No pneumothorax. No significant interval change in kinked right-sided chest tube. Approved by Rollene Fare on 03/05/2016 10:42 AM EDT I have personally reviewed the images and I agree with this report. Report Verified by: Learta Codding, M.D. at 03/05/2016 11:56 AM EDT      Assessment / Plan   Colin [name] is a 55 y.o. male admitted 03/04/2016 s/p Fall from ladder ~ 29ft      Injuries include:  Complex R pelvic fracture- lateral compression pelvic ring injury with R sup/inf rami fxs, R SI diastasis  R 5th rib fx  R  pneumothorax      R pelvic fractures:  Ortho consulting and we are following their recs   s/p pelvic ring external fixation  PT/OT  TTWB RLE, WBAT LLE  Pin care  Pain control      R 5th rib fx  R PTX  CT this am with no leak  CT to H2O will consider DC today  Volume expansion, IS  OOB TID  Pain control  CXR today       Foley has been removed and voiding this am       Acute blood loss anemia:  hgb 7.2 yesterday  Pending am labs  Will consider transfusion when hgb <7.0      Acute pain:  sch APAP  sch Ibuprofen  PRN dilaudid  PRN oxycodone  Lidocaine patches      Constipation:  Miralax  Senna-s        DVT-P: Lovenox      Dispo: PT/OT recs home with PT/OT vs IPR , SW on the case        Jola Baptist, CNP  [facility]  Division of Trauma, Surgical Critical Care, and Acute Care Surgery  Pager: [phone]  Office: [phone]  [facility] Nurse Practitioner: [phone]

## 2016-03-06 NOTE — Unmapped (Signed)
Ortho Nurse Clinician Consult Note:     Chart Reviewed.    Diagnosis/Activity/WBS: Pt is s/p R SI screws, anterior pelvic exfix. TTWB RLE and WBAT LLE.     Wound Care: keep R SI dressing c/d/i. Pelvic ex fix: Keep ex fix Kerlix bolsters clean, dry and intact. Change as needed only if the bolsters become saturated or begin to fall off.     Ortho plan of care: OR plans complete. Recommend PT/OT and pain control.     DVT prophylaxis/Anticoagulation: per primary, encourage OOB and SCDs.     Follow up has been scheduled with Dr. Cloyde Reams 7/24 at 1040. Information placed in the dc navigator. Please call with questions or concerns.     Ortho Charge: [phone]

## 2016-03-06 NOTE — Unmapped (Signed)
Chest tube removed at 1820. Occlusive dressing placed. No immediate complications.

## 2016-03-06 NOTE — Unmapped (Signed)
Trauma Nurse Clinician Daily Progress Note    Trauma Team multi-disciplinary rounds started at 7:30am.    DOB: 1961-06-09 Age: 55 y.o.  PCP: No Pcp    Diagnosis:   Principal Problem:    Fall from ladder  Active Problems:    Traumatic right pneumothorax    Fracture of one rib of right side    Complex R pelvic fracture- lateral compression pelvic ring injury with R sup/inf rami fxs, R SI diastasis        Insurance:   Insurance Information                [facility]ERCIAL[facility] [facility] CHOICE Phone: [phone]    Subscriber: Zaydn, [place] Subscriber#: [id number]    Group#: [id number] Precert#:           Diet:   Diet Orders          Diet regular starting at 07/11 1826           Bowel Regimen/Last recorded bowel movement: Schd senna and miralax. No BM this admission    DVTProphylaxis/Plan/Duplex: Enoxaparin     PT Recs: Recommendation: Home PT, Home with 24 hour supervision/assistance  Equipment Recommended: Rolling Walker    OT Recs: Recommendation: Home with 24 hour supervision/assistance  Equipment Recommended:  (3 in 1 commode)    Weight Bearing Status: toe touch weight bearing on right lower extremity: leg and full as tolerated left leg    Spine Brace: N/A    Cognitive Eval:   Score: N/A no LOC    Assessment/Wounds: Patient sitting up in bed, ex-fix sites assessed, WNL. Patient updated on possible discharge this pm after chest tube removal, stated his girlfriend could drive him home. FMLA paperwork given to nurse clinician to be signed. Patient aware that it will be filled out Friday but return to work will likely be determined by Ortho as WBS status and OR plans are TBD by them. Of note on outside on admission patient noted to have comminuted fx... The anterior column of the right acetabulum as noted in H&P note, this was discussed with the Ortho nurse clinicians they stated their MDs do not note this injury on outside reads and there for patient does not need 21days enoxaparin at discharge.     Discharge plan: Home  with 24hr support. Patient needs rolling walker and 3:1 but does not have benefits, SW looking for indigent walker for patient.     Discussed plan of care and/or discharge plan with patient, family and social work.     Trauma Surgery discharge instructions added/reviewed/updated to/in discharge navigator.    Windell Norfolk BSN, RN  [facility] Tra[facility] Nurse Clinician   (p) [phone]  Trauma Chg RN [phone] (7a-5:30p)

## 2016-03-07 MED ORDER — polyethylene glycol (GLYCOLAX) 17 gram/dose powder
17 | Freq: Two times a day (BID) | ORAL | 0 refills | 14.00000 days | Status: AC
Start: 2016-03-07 — End: 2016-04-30
  Filled 2016-03-07: qty 255, 8d supply, fill #0

## 2016-03-07 MED ORDER — enoxaparinLOVENOXforprophylaxissyringe30mg03mL
30 | Freq: Two times a day (BID) | SUBCUTANEOUS | Status: AC
Start: 2016-03-07 — End: 2016-03-07

## 2016-03-07 MED ORDER — ceFAZolin (ANCEF) IVPB 2 g in D5W (duplex)
2 | Freq: Three times a day (TID) | INTRAVENOUS | Status: AC
Start: 2016-03-07 — End: 2016-03-07

## 2016-03-07 MED ORDER — oxyCODONE (ROXICODONE) 5 MG immediate release tablet
5 | ORAL_TABLET | ORAL | 0 refills | 6.00000 days | Status: AC | PRN
Start: 2016-03-07 — End: 2016-03-17

## 2016-03-07 MED ORDER — bisacodyl (DULCOLAX) suppository 10 mg
10 | Freq: Every day | RECTAL | Status: AC | PRN
Start: 2016-03-07 — End: 2016-03-07

## 2016-03-07 MED ORDER — acetaminophen (TYLENOL) 325 MG tablet
325 | ORAL_TABLET | Freq: Three times a day (TID) | ORAL | 0 refills | 11.00000 days | Status: AC | PRN
Start: 2016-03-07 — End: ?
  Filled 2016-03-07: qty 126, 14d supply, fill #0

## 2016-03-07 MED ORDER — senna-docusate (SENNA-S) 8.6-50 mg per tablet
8.6-50 | ORAL_TABLET | Freq: Two times a day (BID) | ORAL | 0 refills | Status: AC
Start: 2016-03-07 — End: 2016-04-30
  Filled 2016-03-07: qty 30, 15d supply, fill #0

## 2016-03-07 MED ORDER — ibuprofen (ADVIL,MOTRIN) 600 MG tablet
600 | ORAL_TABLET | Freq: Three times a day (TID) | ORAL | 0 refills | Status: AC
Start: 2016-03-07 — End: 2016-04-30
  Filled 2016-03-07: qty 9, 3d supply, fill #0

## 2016-03-07 MED FILL — TYLENOL 325 MG TABLET: 325 325 mg | ORAL | Qty: 3

## 2016-03-07 MED FILL — IBUPROFEN 600 MG TABLET: 600 600 MG | ORAL | Qty: 1

## 2016-03-07 MED FILL — ACETAMINOPHEN 325 MG TABLET: 325 325 MG | ORAL | 14 days supply | Qty: 126 | Fill #0

## 2016-03-07 MED FILL — SENNA-S 8.6 MG-50 MG TABLET: 8.6-50 8.6-50 mg | ORAL | Qty: 1

## 2016-03-07 MED FILL — POLYETHYLENE GLYCOL 3350 17 GRAM ORAL POWDER PACKET: 17 17 gram | ORAL | Qty: 1

## 2016-03-07 MED FILL — OXYCODONE 5 MG TABLET: 5 5 MG | ORAL | Qty: 2

## 2016-03-07 MED FILL — CEFAZOLIN 2 GRAM/50 ML IN DEXTROSE (ISO-OSMOTIC) INTRAVENOUS PIGGYBACK: 2 2 gram/50 mL | INTRAVENOUS | Qty: 50

## 2016-03-07 MED FILL — ENOXAPARIN 30 MG/0.3 ML SUBCUTANEOUS SYRINGE: 30 30 mg/0.3 mL | SUBCUTANEOUS | Qty: 0.3

## 2016-03-07 MED FILL — LIDODERM 5 % TOPICAL PATCH: 5 5 % | TOPICAL | Qty: 1

## 2016-03-07 NOTE — Unmapped (Signed)
Oxycodone for c/o right pelvic pain 8/10. External fixator intact no drainage. Small amount of crepitus noted during last rounds posterior to the old CT site. Advised patient to have the surgical team assess during morning rounds. Updated RN charge nurse. Will report findings to oncoming RN.

## 2016-03-07 NOTE — Unmapped (Signed)
ORTHO PROGRESS NOTE    S: No acute events overnight. Pain under control. No complaints this AM. Denies numbness/tingling in RLE.    Filed Vitals:    03/06/16 1548 03/06/16 1924 03/06/16 2306 03/07/16 0349   BP: 130/78 119/76 124/71 124/87   Pulse: 93 91 92 100   Temp: 98.4 ??F (36.9 ??C) 98.7 ??F (37.1 ??C) 98.5 ??F (36.9 ??C) 98.7 ??F (37.1 ??C)   TempSrc: Oral Oral Oral Oral   Resp: 16 16 16 16    Height:       Weight:       SpO2: 96% 94% 95% 100%       Gen: NAD  Pulm: Regular, unlabored breathing    Pelvis - anterior pins in place with bolsters    Right LE -   Gluteal dressing c/d/i  SILT sp/dp/t/sural/saph b/l  EHL/TA/GSC intact b/l  Toes up and down b/l  Cap refill < 2 sec b/l    A/P:  55 y.o. male s/p R SI screws, anterior pelvic exfix    IV ABx: ancef post-op x 2 doses  TTWB RLE, WBAT LLE  PT/OT: eval and treat  OR plans: complete at this time  Anticoagulation: per primary  Dispo planning: per primary      Dennisha Mouser  Ortho Trauma: [id number]

## 2016-03-07 NOTE — Unmapped (Signed)
[facility] TRAUMA SERVICE PROGRESS NOTE  TERON BLAIS  Admit date: 03/04/2016   LOS: 3 days     Subjective / Events of Last 24HRS   Doing well this am   CT removed 1500 last night      Objective   Vitals:  Temp:  [97.9 ??F (36.6 ??C)-98.7 ??F (37.1 ??C)] 98.7 ??F (37.1 ??C)  Heart Rate:  [91-100] 100  Resp:  [16] 16  BP: (119-150)/(71-87) 124/87 mmHg  Filed Vitals:    03/07/16 0349   BP: 124/87   Pulse: 100   Temp: 98.7 ??F (37.1 ??C)   Resp: 16   SpO2: 100%         Date 03/06/16 0700 - 03/07/16 0659 03/07/16 0700 - 03/08/16 0659   Shift 0700-1459 1500-2259 2300-0659 24 Hour Total 0700-1459 1500-2259 2300-0659 24 Hour Total   I  N  T  A  K  E   P.O. 480 360  840          P.O. 480 360  840        I.V.  (mL/kg)  10  (0.1)  10  (0.1)          I.V.  10  10        Shift Total  (mL/kg) 480  (5.9) 370  (4.5)  850  (10.4)       O  U  T  P  U  T   Urine  (mL/kg/hr)  875  (1.3) 400 1275          Urine  [phone]        Emesis/NG output   0 0          Emesis   0 0        Stool   0 0          Stool Occurrence 0 x   0 x          Stool   0 0        Chest Tube 8   8          Output (mL) ([REMOVED] Chest Tube Right Midaxillary;Fourth intercostal space) 8   8        Shift Total  (mL/kg) 8  (0.1) 875  (10.7) 400  (4.9) 1283  (15.7)       Weight (kg) 81.6 81.6 81.6 81.6 81.6 81.6 81.6 81.6       Physical Exam:  Gen: Cooperative, no acute distress  Neuro: Alert and oriented x4, GCS 15  HEENT: NCAT, PERRL, neck supple  CV: Regular rate and rhythm, normal S1 and S2  Resp: CTAB, no respiratory distress, IS pulling 2500, on RA  Abd: Soft, non-distended, non-tender, no masses; pelvic anterior external fixator  Ext: Warm and well perfused, neurovascularly intact      Recent Labs      03/05/16   2002  03/06/16   0743  03/06/16   1955   WBC  9.6  8.2  9.5   HGB  7.2*  7.4*  7.6*   HCT  21.4*  21.5*  21.9*   PLT  220  226  253       Current Medications:  Scheduled Medications:    acetaminophen 975 mg 3 times per day   enoxaparin 30 mg BID8   ibuprofen  600 mg TID WC   lidocaine 1 patch Daily 0900   polyethylene glycol 17 g BID   senna-docusate  1 tablet BID     IV Medications:     PRN Medications:    oxyCODONE 5 mg Q4H PRN   Or     oxyCODONE 10 mg Q4H PRN       Imaging:  Chest X-ray Pa Lateral    03/06/2016  Exam: XR CHEST PA AND LATERAL CLINICAL HISTORY: Pneumothorax. TECHNIQUE: AP and Lateral views of the chest COMPARISON: Radiograph of the chest from 7 hours prior. FINDINGS: Medical Devices: Right-sided chest tube with tip projecting over the medial right hemithorax. Heart and Mediastinum: Unchanged. Lungs and Pleura:  Minimal bibasilar parenchymal opacities, unchanged and likely scarring/atelectasis. No visualized pneumothorax. Bones and Soft tissues: Nothing acute.     03/06/2016  IMPRESSION: No visualized pneumothorax. Report Verified by: Essie Hart, MD at 03/06/2016 3:47 PM EDT    Chest X-ray Portable    03/06/2016  Exam: XR PORTABLE CHEST CLINICAL HISTORY: Chest tube. TECHNIQUE: Portable AP view of the chest. COMPARISON: 03/05/2016 and 03/04/2016. FINDINGS: Medical Devices: There is interval repositioning of the right lateral chest tube with tip projecting over the right upper zone medially. Heart and Mediastinum: The cardiac silhouette and mediastinal contours are normal. Lungs and Pleura: There is no pneumothorax or pleural effusion. There are streaky bibasilar airspace opacities. Bones and Soft tissues: Subcutaneous emphysema along the right lateral chest wall and axilla persists. No acute osseous abnormalities.     03/06/2016  IMPRESSION: 1.  Right lateral chest tube tip projects over the right upper zone medially. No evidence of pneumothorax. 2.  Streaky bibasilar airspace opacities, likely atelectasis. Approved by Tonye Becket on 03/06/2016 8:40 AM EDT I have personally reviewed the images and I agree with this report. Report Verified by: Essie Hart, MD at 03/06/2016 8:45 AM EDT      Assessment / Plan   KYLLIAN CLINGERMAN is a 55 y.o. male  admitted 03/04/2016 s/p Fall from ladder ~ 25ft      Injuries include:  Complex R pelvic fracture- lateral compression pelvic ring injury with R sup/inf rami fxs, R SI diastasis  R 5th rib fx  R pneumothorax      R pelvic fractures:  Ortho consulting and we are following their recs   s/p pelvic ring external fixation  PT/OT  TTWB RLE, WBAT LLE  Pin care  Pain control      R 5th rib fx  R PTX  Volume expansion, IS  OOB TID  Pain control  CT removed yesterday  CT dressing intact         Acute blood loss anemia:  hgb 7.6 stable   Will consider transfusion when hgb <7.0      Acute pain:  sch APAP  sch Ibuprofen  PRN dilaudid  PRN oxycodone  Lidocaine patches      Constipation:  Miralax  Senna-s        DVT-P: Lovenox      Dispo: PT/OT recs home with PT/OT vs IPR , SW on the case, ready today        Jola Baptist, CNP  [facility]  Division of Trauma, Surgical Critical Care, and Acute Care Surgery  Pager: [phone]  Office: [phone]  [facility] Nurse Practitioner: [phone]

## 2016-03-07 NOTE — Unmapped (Signed)
Trauma Nurse Clinician Discharge Progress Note    Trauma Team multi-disciplinary rounds started at 7:30am.    DOB: May 26, 1961 Age: 55 y.o.    Diagnosis:   Principal Problem:    Fall from ladder  Active Problems:    Traumatic right pneumothorax    Fracture of one rib of right side    Complex R pelvic fracture- lateral compression pelvic ring injury with R sup/inf rami fxs, R SI diastasis      Discharge Lines and Tubes: None    Discharge Anticoagulation: None    Weight Bearing Status: toe touch weight bearing on right lower extremity: leg and full as tolerated left leg    Spine Brace: N/a    Assessment/Wounds: Patient sitting up in bed, bedside RN doing education on ex-fix. Patient updated by team on discharge. Patient agreeable at this time. Aware that FMLA and STD will be filled out and copy given to him as after 7/28 appointment Ortho to clear him for return to work due to [facility] and pending ORs. Patient has follow up with trauma due to having required a CT during his hospital stay. All questions answered.     Discharge plan: home with 24hr assist and HHC    Follow up appointments:   Future Appointments  Date Time Provider Department Center   03/17/2016 10:40 AM Andee Lineman, MD [facility] ORTH [facility]   03/21/2016 9:15 AM SURT SURG TRAUMA [facility] SURT OP OP       Incidental findings: NONE    Discussed plan of care and/or discharge plan with patient, family and social work.     Trauma Surgery discharge instructions added/reviewed/updated to/in discharge navigator.    Windell Norfolk BSN, RN  [facility] Trauma Nurse Clinician   (p) [phone]  Trauma Chg RN [phone] (7a-5:30p)

## 2016-03-07 NOTE — Unmapped (Signed)
REFERRAL FOR HOME HEALTH SERVICES FORM     Patient name: Colin [name]  Patient MRN: [id number]  DOB: May 10, 1961  Age: 55 y.o.  Gender: male  SSN: [id number]  Address: [address] [place] [address]     Phone number: [phone] (home)    Patient emergency contact: Extended Emergency Contact Information  Primary Emergency Contact: [place], [place], [place] [address] [place]  Home Phone: [phone]  Mobile Phone: [phone]  Relation: Father  Preferred language: English  Interpreter needed? No    Date of admission: 03/04/2016  Date of discharge: 03/07/2016  Attending provider: Jeani Hawking, MD  Primary care physician: No Pcp    Code status: Full Code  Allergies: No Known Allergies    Insurance Information     Insurance Information                [facility] COMMERCIAL/ HEALTH CHOICE Phone: [phone]    Subscriber: Dayshon, [place]ack Subscriber#: [id number]    Group#: [id number] Precert#:           Diagnoses Present on Admission   Primary Diagnosis: Fall from ladder  Discharge Diagnosis :   Active Hospital Problems    Diagnosis Date Noted   ??? Fall from ladder [W11.XXXA] 03/04/2016   ??? Traumatic right pneumothorax [S27.0XXA] 03/06/2016   ??? Fracture of one rib of right side [S22.31XA] 03/06/2016   ??? Complex R pelvic fracture- lateral compression pelvic ring injury with R sup/inf rami fxs, R SI diastasis [S32.810A] 03/06/2016      Resolved Hospital Problems    Diagnosis Date Noted Date Resolved   No resolved problems to display.     Prognosis: good  Rehabilitation potential: good    Diet     Diet Orders          Diet regular starting at 07/11 1826           Regular Diet    Services Required   Skilled Nursing  Physical Therapy: Plan  Treatment/Interventions: Therapeutic Activity, Therapeutic Exercise, Stair Training, Gait training, Endurance training, LE strengthening/ROM, Continued evaluation, Equipment eval/education  PT Frequency: 3-5x/wk  Recommendation  Recommendation: Home PT, Home with 24 hour  supervision/assistance  Equipment Recommended: Rolling Walker  Occupational Therapy: Plan  Treatment Interventions: ADL retraining, IADL retraining, Functional transfer training, Activity Tolerance training, Patient/Family training, Continued evaluation, Compensatory technique education, Therapeutic Activity, Equipment eval/education, Excercise  OT Frequency: 3-5x/wk  Recommendation  Recommendation: Home with 24 hour supervision/assistance  Equipment Recommended:  (3 in 1 commode)    Weight bearing status: toe touch weight bearing on right lower extremity: leg and full as tolerated right leg    Needs 24 hour supervision due to cognitive impairment: No    Discharge Medications   Medications:  Current Discharge Medication List      START taking these medications    Details   acetaminophen (TYLENOL) 325 MG tablet Take 3 tablets (975 mg total) by mouth every 8 hours as needed for Pain.  Qty: 126 tablet, Refills: 0      ibuprofen (ADVIL,MOTRIN) 600 MG tablet Take 1 tablet (600 mg total) by mouth 3 times a day with meals for 3 days.  Qty: 9 tablet, Refills: 0      oxyCODONE (ROXICODONE) 5 MG immediate release tablet Take 1-2 tablets (5-10 mg total) by mouth every 4 hours as needed.  Qty: 45 tablet, Refills: 0      polyethylene glycol (MIRALAX) 17  gram packet Take 17 g by mouth 2 times a day.  Qty: 14 packet, Refills: 0      senna-docusate (SENNA-S) 8.6-50 mg per tablet Take 1 tablet by mouth 2 times a day.  Qty: 30 tablet, Refills: 0                 Discharge Specific Orders   Discharge specific orders:  RESPIRATORY:  Incentive spirometer ten times per hour while awake.  BLADDER:  Monitor for urinary retention, urinary incontinence and outputs daily  Monitor for regular bowel movements  WOUND CARE:  [x]  leave sutures/staples/steri-strips in place until follow up visit: no lotions, creams or ointments, keep clean and dry. No soaking till Ortho MD gives the ok.   [x] keep chest tube dressing in place for 48 hours, remove the  morning of 03/09/2016   [x] Assess pin sites for s/s of infections. Keep ex fix Kerlix bolsters clean, dry and intact. Change as needed only if the bolsters become saturated or begin to fall off. Any questions about your ex-fix please contact Ortho Hotline at [phone]    EXTREMITY ORTHOTICS:   Activity as tolerated  Out of bed to chair three times a day as tolerated  May sponge bathe only   Isolation     Active Isolation     None      Removed Isolation     None          Vitals     Patient Vitals for the past 4 hrs:   BP Temp Temp src Pulse Resp SpO2   03/07/16 0823 (!) 133/95 mmHg 98.7 ??F (37.1 ??C) Oral 97 18 99 %       Equipment/Supplies   3 in 1 Bedside Commode  Rolling Walker  The patient will need the following test completed on: 03/03/2016                 1. Commode Elevated 3 In 1            Diagnosis: Fall from ladder and initial encounter [phone])           Authorizing Provider: Bonnee Quin, MD     Ordering Physician: NPI  Jeani Hawking, MD]    Physician Certification   Further, I certify that my clinical findings support that this patient is homebound (i.e. absences from home require considerable and taxing effort and are for medical reasons or religious services or infrequently or short duration when for other reasons) due to deconditioning it would be a taxing effort to receive outpatient services.    My signature below is to certify that this patient is under my care and that I, or nurse practitioner, or a physician assistant working with me, had a face-to-face encounter with this is patient on: 03/07/2016     Follow-up Appointments and [facility] Discharge Physician Name     Future Appointments  Date Time Provider Department Center   03/17/2016 10:40 AM Andee Lineman, MD [facility]   03/21/2016 9:15 AM SURT SURG TRAUMA [facility] SURT OP OP     Surgery Trauma Clinic  [address] Outpatient Building  2nd Floor  [place] [address]  [phone]  On 03/21/2016  at 9:30am, arrive by  9:00am to check into the clinic    Andee Lineman, MD  [address]  [place] [address]  [phone]    On 03/17/2016  please arrive at 10:10 am for a 10:40 am appointment for  your pelvis       Discharging Physician Signature and Credentials   Discharging Physician: Electronically signed by     Jola Baptist, CNP  [facility]  Division of Trauma, Surgical Critical Care, and Acute Care Surgery  Pager: [phone]  Office: [phone]  [facility] Nurse Practitioner: [phone]       03/07/2016, 8:51 AM    Physician to follow up Information   PCP: No Pcp  PCP address: [address] / [place] [address]  PCP phone number: None  PCP fax number: None    If PCP is not following patient, type physician contact information here:             [address] [place] [address]  [place], [place] [address]  [phone]  Emergencies Only:  [phone] & ask for the Trauma Resident on call  Fax [phone]    Discharge Planner and Credentials     Provider/Company Name and Contact Number:          Home Health Company Name: [facility]    Home Health Company Contact Number: [phone]                          Name of DME Company at Discharge: [employer]    DME Provider Contact Number: [phone]    Discharge Planner Name and Telephone Number:

## 2016-03-07 NOTE — Unmapped (Signed)
Noted that signed and held orders not released from 7/11 and appears that post antibiotic was not administered. Notified MD on call of situation. No new orders at this time. Will continue to monitor.

## 2016-03-07 NOTE — Unmapped (Signed)
Clarified with ortho team that IV ATB, Ancef.

## 2016-03-07 NOTE — Unmapped (Signed)
[facility] [phone] will provide therapy. HHC and dc summary faxed via [facility] to [employer].    No further CCA needs.    Lawrence Marseilles  Care Coordinator Assistant  [phone]

## 2016-03-07 NOTE — Unmapped (Signed)
Patient discharged home at this time. Alert and oriented. Chest tube site dressing intact. RLE dressing intact. Patient received AVS and verbalized understanding of AVS. Received medications from [facility]. Received paper prescription for 3:1 commode. No S/S of a cute distress.

## 2016-03-07 NOTE — Unmapped (Signed)
TRAUMA SERVICE DISCHARGE INSTRUCTIONS - HOME    Trauma Hotline:   [phone]  Trauma Fax:  [phone]    *For questions please call the Trauma Hotline and leave a message.*  If your call is between the hours of 7:00 AM - 3:30 PM (7 days per week) your call will be returned the same day; otherwise your call will be returned the next day by one of the Trauma Nurses.    For emergent problems after hours please call the [facility] at [phone] and ask for the Trauma Resident on call or return to an Emergency Department.    Please call the Trauma Hotline [phone] or return to an Emergency Department for:  1) Fever greater than 101.5??                                         2) Persistent nausea & vomiting                               3) Shortness of breath  4) Increasing unrelieved pain  5) Foul smelling drainage from wounds/surgical sites  6) Increasing redness, swelling, or warmth at wound/surgical sites.    If you are in need of a copy of your medical records, please contact:  Health Information Services Department (Medical Records)  [phone]  [phone] Fax  [email or web address] for authorization forms    INCIDENTAL FINDINGS:  [x]  None     MECHANISM OF INJURY:   Date of Injury: 03/04/2016    INJURIES:  Present on Admission:   ??? Traumatic right pneumothorax  ??? Fracture of one rib of right side  ??? Complex R pelvic fracture- lateral compression pelvic ring injury with R sup/inf rami fxs, R SI diastasis    DIET:  [x]  Regular    ACTIVITY:      [x]  Light activity    [x]  Walk and get out of bed frequently  [x]  May sponge bathe only      [x]  Do not lift more than:          [x]  10 pounds       [x]  Other:  Ok to pull self up                                                                              EXTREMITY WEIGHT BEARING RESTRICTIONS:   Right leg:       [x]  Toe touch weight bear      Left leg:       [x]  Weight bear as tolerated      WOUND CARE:       [x]  Keep ex fix Kerlix bolsters  clean, dry and intact. Change as needed only if the bolsters become saturated or begin to fall off.    [x]  Leave chest tube dressing in place for 48 hours total after chest tube removed.      May remove dressing on: the morning of 03/09/2016      Then clean chest tube sites daily with soap & water and apply  bandage.          Suture (if present) will be removed at follow-up visit.               PATIENT/FAMILY TEACHING:  [x]  Return to work/school on:        [x]  To be determined at follow-up visit    [x]  Return to driving on:        [x]  To be determined at follow-up visit with Ortho           [x]  When no longer taking narcotics                                          [x]  Fall Education   [x]  Pain management   [x]  Incentive spirometer and coughing 10 times an hour while awake. This is especially important for the next 4 weeks             PTSD (POSTTRAUMATIC STRESS DISORDER OR SYNDROME):  If thirty (30) days or more from your recent hospitalization you experience any of the following symptoms:  ??? Flashbacks/nightmares about the trauma  ??? Trouble sleeping  ??? Avoiding people, places or things that remind you of your recent trauma  ??? Negative thoughts about yourself, others or the world  ??? Feeling emotionally numb, irritable or angry  ??? Feeling like you have to be on alert  you could be experiencing Posttraumatic Stress Syndrome (PTSD).    PTSD is a mental health condition that may develop after a person is exposed to a traumatic event such as abuse, assault, motor vehicle crash, gunshot wound or the threat of death.  Individuals diagnosed with PTSD are at high risk for depression/anxiety, substance abuse and poor physical health.  PTSD can also affect your work, family and social life.  To request more information/assistance please contact the [facility] at [phone] or contact your Primary Care Physician (PCP).    MEDICATIONS:                                                      [x]  Do not take  Ibuprofen/NSAID (Advil, Motrin, Aleve, Naprosyn, Mobic, Celebrex) products unless ok'd by Orthopaedics physician.  [x]  OK to take Acetaminophen (Tylenol) when taking plain Oxycodone (Roxicodone).     -Do not take additional Acetaminophen (Tylenol) products while taking combination medications Oxycodone/APAP (Percocets) or Hydrocodone/APAP (Lortab, Vicodin, Norco).     -Do not exceed 3000 mg (9 tablets of 325 mg strength or 6 tablets of 500 mg strength) Acetaminophen (Tylenol) in 24 hours.                             *Take pain medication as prescribed. Do not drink alcohol, drive or operate heavy machinery while on narcotics.    DISCHARGE:   Please bring these discharge instructions with you to all follow-up appointments      [x]  Home with 24 hour/day assistance             [x]  Equipment Company:       Phone Number:        [x]  3:1 Commode        [x]  Rolling Walker:        [  x] Home Care Agency:       Phone Number:        Services ordered:       [x]  PT - Physical Therapy           [x]  OT - Occupational Therapy       [x]  Skilled Nursing      Please call the Trauma Hotline [phone] if you have not heard from your Home Care Agency in 2 days.    Directions to [facility] of [facility]  Parking Garage Patients and visitors may park in the [place] [facility] parking garage.  The garage entrance is on [facility], across from the [employer].  [facility] is directly accessible from [employer].  Complimentary parking vouchers are available at the lobby information desk in the [facility] and at the registration desks at all clinics in the Outpatient Center for patients and the patient visitors.  The hospital assumes no responsibility for vehicles or their contents when parked at our facilities.  For more information, contact the Parking and Engineer, agricultural at [phone].  Valet Parking Full-service valet parking is available at the  [facility], [address], Monday through Friday 5a.m. - 9 p.m. and [facility], [address], Monday through Friday 10 a.m. - 2 p.m. Valet parking is offered to patients and visitors for $4.  After You Have Parked  Proceed to the main Hospital, [address], via the connector on garage level P2 (Red)  -Proceed to the Main Lobby  -Follow the red circle ???breadcrumbs??? on the wall to the tunnel  -Continue to follow the red circle ???breadcrumbs??? on the wall through the tunnel to stay on the public path to the Outpatient Center  OR  -Proceed to [facility] F1 via the connector on garage Level P3 (Blue)  -Proceed to the porch  -Turn right and follow the path to the Outpatient Center located on the left  Shuttle Service  For you convenience, [employer] provides a shuttle service to connect you to the Outpatient Center, Shuttle Stop 6 - [address]. The shuttle operates Monday throught Friday 6 a.m. - 8:30 p.m.; stops made every 20 minutes.  You may take the shuttle from the [facility] entrance [facility] A). For more information, call the Parking and Hydrographic surveyor Office at [phone].  Patient Drop Off at [address] [facility] [address] [facility])  Turn left onto the one-way drive directly accessible from [employer] [address] is accessible from [facility].) to arrive at the Outpatient Center, [address].  If you need assistance getting to your destination, please stop by the information desk located in the lobby of the [facility] where one of our associates will help guide you to the Outpatient Center.          REFERRAL FOR HOME HEALTH SERVICES FORM     Patient name: Colin [name]  Patient MRN: [id number]  DOB: [birth year]  Age: 55 y.o.  Gender: male  SSN: [id number]  Address: [address] [place] [address]     Phone number: [phone] (home)    Patient emergency contact: Extended Emergency Contact Information  Primary Emergency Contact: Avin, [place], [place] [address] [address] [place]  of [place]  Home Phone: [phone]  Mobile Phone: [phone]  Relation: Father  Preferred language: English  Interpreter needed? No    Date of admission: 03/04/2016  Date of discharge: 03/07/2016  Attending provider: Jeani Hawking, MD  Primary care physician: No Pcp    Code status: Full Code  Allergies: No Known Allergies    Insurance Information     Insurance Information                [facility] COMMERCIAL/[place] HEALTH CHOICE Phone: [phone]    Subscriber: Kieran, Arreguin Subscriber#: [id number]    Group#: [id number] Precert#:           Diagnoses Present on Admission   Primary Diagnosis: Fall from ladder  Discharge Diagnosis :   Active Hospital Problems    Diagnosis Date Noted   ??? Fall from ladder [W11.XXXA] 03/04/2016   ??? Traumatic right pneumothorax [S27.0XXA] 03/06/2016   ??? Fracture of one rib of right side [S22.31XA] 03/06/2016   ??? Complex R pelvic fracture- lateral compression pelvic ring injury with R sup/inf rami fxs, R SI diastasis [S32.810A] 03/06/2016      Resolved Hospital Problems    Diagnosis Date Noted Date Resolved   No resolved problems to display.     Prognosis: good  Rehabilitation potential: good    Diet     Diet Orders          Diet regular starting at 07/11 1826           Regular Diet    Services Required   Skilled Nursing  Physical Therapy: Plan  Treatment/Interventions: Therapeutic Activity, Therapeutic Exercise, Stair Training, Gait training, Endurance training, LE strengthening/ROM, Continued evaluation, Equipment eval/education  PT Frequency: 3-5x/wk  Recommendation  Recommendation: Home PT, Home with 24 hour supervision/assistance  Equipment Recommended: Rolling Walker  Occupational Therapy: Plan  Treatment Interventions: ADL retraining, IADL retraining, Functional transfer training, Activity Tolerance training, Patient/Family training, Continued evaluation, Compensatory technique education, Therapeutic Activity, Equipment eval/education, Excercise  OT Frequency:  3-5x/wk  Recommendation  Recommendation: Home with 24 hour supervision/assistance  Equipment Recommended:  (3 in 1 commode)    Weight bearing status: toe touch weight bearing on right lower extremity: leg and full as tolerated right leg    Needs 24 hour supervision due to cognitive impairment: No    Discharge Medications   Medications:  Current Discharge Medication List      START taking these medications    Details   acetaminophen (TYLENOL) 325 MG tablet Take 3 tablets (975 mg total) by mouth every 8 hours as needed for Pain.  Qty: 126 tablet, Refills: 0      ibuprofen (ADVIL,MOTRIN) 600 MG tablet Take 1 tablet (600 mg total) by mouth 3 times a day with meals for 3 days.  Qty: 9 tablet, Refills: 0      oxyCODONE (ROXICODONE) 5 MG immediate release tablet Take 1-2 tablets (5-10 mg total) by mouth every 4 hours as needed.  Qty: 45 tablet, Refills: 0      polyethylene glycol (MIRALAX) 17 gram packet Take 17 g by mouth 2 times a day.  Qty: 14 packet, Refills: 0      senna-docusate (SENNA-S) 8.6-50 mg per tablet Take 1 tablet by mouth 2 times a day.  Qty: 30 tablet, Refills: 0                 Discharge Specific Orders   Discharge specific orders:  RESPIRATORY:  Incentive spirometer ten times per hour while awake.  BLADDER:  Monitor for urinary retention, urinary incontinence and outputs daily  Monitor for regular bowel movements  WOUND CARE:  [x]  leave sutures/staples/steri-strips in place until follow  up visit: no lotions, creams or ointments, keep clean and dry. No soaking till Ortho MD gives the ok.   [x] keep chest tube dressing in place for 48 hours, remove the morning of 03/09/2016   [x] Assess pin sites for s/s of infections. Keep ex fix Kerlix bolsters clean, dry and intact. Change as needed only if the bolsters become saturated or begin to fall off. Any questions about your ex-fix please contact Ortho Hotline at [phone]    EXTREMITY ORTHOTICS:   Activity as tolerated  Out of bed to chair three times a day as  tolerated  May sponge bathe only   Isolation     Active Isolation     None      Removed Isolation     None          Vitals     Patient Vitals for the past 4 hrs:   BP Temp Temp src Pulse Resp SpO2   03/07/16 0823 (!) 133/95 mmHg 98.7 ??F (37.1 ??C) Oral 97 18 99 %       Equipment/Supplies   3 in 1 Bedside Commode  Rolling Walker  The patient will need the following test completed on: 03/03/2016                 1. Commode Elevated 3 In 1            Diagnosis: Fall from ladder and initial encounter [phone])           Authorizing Provider: Bonnee Quin, MD     Ordering Physician: NPI  Jeani Hawking, MD]    Physician Certification   Further, I certify that my clinical findings support that this patient is homebound (i.e. absences from home require considerable and taxing effort and are for medical reasons or religious services or infrequently or short duration when for other reasons) due to deconditioning it would be a taxing effort to receive outpatient services.    My signature below is to certify that this patient is under my care and that I, or nurse practitioner, or a physician assistant working with me, had a face-to-face encounter with this is patient on: 03/07/2016     Follow-up Appointments and [facility] Discharge Physician Name     Future Appointments  Date Time Provider Department Center   03/17/2016 10:40 AM Andee Lineman, MD [facility] ORTH [facility]   03/21/2016 9:15 AM SURT SURG TRAUMA [facility] SURT OP OP     Surgery Trauma Clinic  [address] Outpatient Building  2nd Floor  [place] [address]  [phone]  On 03/21/2016  at 9:30am, arrive by 9:00am to check into the clinic    Andee Lineman, MD  [address]  [place] [address]  [phone]    On 03/17/2016  please arrive at 10:10 am for a 10:40 am appointment for your pelvis       Discharging Physician Signature and Credentials   Discharging Physician: Electronically signed by     Jola Baptist, CNP  [facility]  Surgery  Division of Trauma, Surgical Critical Care, and Acute Care Surgery  Pager: [phone]  Office: [phone]  [facility] Nurse Practitioner: [phone]       03/07/2016, 8:51 AM    Physician to follow up Information   PCP: No Pcp  PCP address: [address] / [place] [address]  PCP phone number: None  PCP fax number: None    If PCP is not following patient, type  physician contact information here:             [address] [place] [address]  [place], [place] [address]  [phone]  Emergencies Only:  [phone] & ask for the Trauma Resident on call  Fax [phone]    Discharge Planner and Credentials     Provider/Company Name and Contact Number:          Home Health Company Name: [facility]    Home Health Company Contact Number: [phone]                          Name of DME Company at Discharge: [facility]    DME Provider Contact Number: [phone]    Discharge Planner Name and Telephone Number:

## 2016-03-07 NOTE — Unmapped (Signed)
CCA informed by CC Victorino Dike that [facility] [phone] is no longer able to provide PT/OT/SN. Referrals sent to [facility] [phone], [facility] [phone], [facility] [phone] and [facility] [phone].    CCA will continue to follow.    Lawrence Marseilles  Care Coordinator Assistant  [phone]

## 2016-03-10 NOTE — Unmapped (Signed)
Patient called and stated he would be out of his medication in one day, had #15 left when he called this am. Patient stated that the pain had not changed but he still was requiring to take 10mg  Oxy Q4hrs since his discharge on Friday. Patient still taking Tyl as prescribed as well.     Spoke with NP Vandergriff, script for #84 oxycodone written with instructions to take 5-10mg  Q4hrs for pain. OARRS run by NP at time script written. Script left at front desk.    Patient called, educated on how to pick up script and mediation instructions. Patient and SO Colin [name] verbalized understanding.      Colin [name] BSN, RN  [facility] Trauma Nurse Clinician   (p) [phone]  Trauma Chg RN [phone] (7a-5:30p)

## 2016-03-17 ENCOUNTER — Inpatient Hospital Stay: Admit: 2016-03-17 | Payer: Legal Liability / Liability Insurance

## 2016-03-17 ENCOUNTER — Ambulatory Visit: Admit: 2016-03-17 | Discharge: 2016-03-17 | Payer: PRIVATE HEALTH INSURANCE

## 2016-03-17 DIAGNOSIS — K219 Gastro-esophageal reflux disease without esophagitis: Secondary | ICD-10-CM | POA: Diagnosis not present

## 2016-03-17 DIAGNOSIS — E1165 Type 2 diabetes mellitus with hyperglycemia: Secondary | ICD-10-CM | POA: Diagnosis not present

## 2016-03-17 DIAGNOSIS — I10 Essential (primary) hypertension: Secondary | ICD-10-CM | POA: Diagnosis not present

## 2016-03-17 DIAGNOSIS — F419 Anxiety disorder, unspecified: Secondary | ICD-10-CM | POA: Diagnosis not present

## 2016-03-17 DIAGNOSIS — R102 Pelvic and perineal pain: Secondary | ICD-10-CM

## 2016-03-17 DIAGNOSIS — S32810D Multiple fractures of pelvis with stable disruption of pelvic ring, subsequent encounter for fracture with routine healing: Secondary | ICD-10-CM

## 2016-03-17 MED ORDER — HYDROcodone-acetaminophen (NORCO) 5-325 mg per tablet
5-325 | ORAL_TABLET | Freq: Four times a day (QID) | ORAL | 0.00 refills | 15.50000 days | Status: AC | PRN
Start: 2016-03-17 — End: 2016-04-30

## 2016-03-17 NOTE — Unmapped (Signed)
Staples were removed per the doctor's instruction.   Patient tolerated this well with no complications.   Steri strips were applied.   Patient was given instructions regarding showering/bathing.

## 2016-03-17 NOTE — Unmapped (Signed)
Chief Complaint: postop check pelvis injury    Subjective:   Colin [name] is a 55 y.o. male here for a postop check s/p pelvis injury including R SI screw and anterior pelvic ex fix placed on 7/11. Injury occurred on 7/10 when he fell about 17 feet off a roof. States he was doing well for the first week able to WB on the left leg, then was unable to straighten leg or weight bear all of a sudden with no known injury. No xrays were taken in the hospital. Denies numbness, tingling, fevers, chills.       ROS: All others negative except for as stated in HPI    The following portions of the patient's history were reviewed and updated as appropriate: allergies, current medications, past medical history, past surgical history, and past social history.    Objective:  Filed Vitals:    03/17/16 1006   Height: 5' 8 (1.727 m)       Physical Exam:  General: AAOx3; well-appearing, well nourished, NAD.  Anterior pins look good without drainage. R side of pelvis with staples intact for SI screw.  EHL/gastroc/TA intact with 5/5 strength BLE  Left knee unable to passive extend all the way, contracted at 50 degrees and flex back to about 90 degrees.     Neuro: gross sensation intact to light touch  Vascular: palpable DP pulse, warm & well perfused.     Imaging: xrays were reviewed and show stable appearance pelvic ring with stable fixation    Assessment:  Colin [name] is a 55 y.o. male s/p pelvis injury, anterior ex fix and R SI screw    Plan:  1. Removed staples today, applied steri strips  2. Ok for patient to shower  3. WBAT LLE, TTWB RLE  4. Pin care. OK to shower   5. range of motion exercises 2-3x daily  6. Ice/elevate as needed to decrease pain & swelling  7. MRI L knee to evaluate for meniscus tear  8. Refilled pain medication. Discussed pain medicine protocol and will wean from narcotics after this prescription and take ES Tylenol as needed as well as NSAIDs  9. Follow up in 6wks with repeat xrays pelvis and discuss  possible removal ex fix at that time    Dr Cloyde Reams has personally seen the patient today and agrees with the plan.      Corinn Stoltzfus L Tandra Rosado, PA-C

## 2016-03-18 NOTE — Unmapped (Signed)
Mr. Dacus MRI is scheduled for July 29 @ 9:30 am @ [facility]. FU scheduled for July 31 @ 10:10 am @ [facility]. Ins is [employer] and [employer]. Patient is aware of both appointments.

## 2016-03-19 DIAGNOSIS — K219 Gastro-esophageal reflux disease without esophagitis: Secondary | ICD-10-CM | POA: Diagnosis not present

## 2016-03-19 DIAGNOSIS — E1165 Type 2 diabetes mellitus with hyperglycemia: Secondary | ICD-10-CM | POA: Diagnosis not present

## 2016-03-19 DIAGNOSIS — F419 Anxiety disorder, unspecified: Secondary | ICD-10-CM | POA: Diagnosis not present

## 2016-03-19 DIAGNOSIS — M545 Low back pain: Secondary | ICD-10-CM | POA: Diagnosis not present

## 2016-03-19 DIAGNOSIS — Z6841 Body Mass Index (BMI) 40.0 and over, adult: Secondary | ICD-10-CM | POA: Diagnosis not present

## 2016-03-19 DIAGNOSIS — I10 Essential (primary) hypertension: Secondary | ICD-10-CM | POA: Diagnosis not present

## 2016-03-21 ENCOUNTER — Ambulatory Visit: Admit: 2016-03-21 | Payer: PRIVATE HEALTH INSURANCE | Attending: Surgical Critical Care

## 2016-03-21 DIAGNOSIS — S2241XD Multiple fractures of ribs, right side, subsequent encounter for fracture with routine healing: Secondary | ICD-10-CM

## 2016-03-21 MED ORDER — oxyCODONE (ROXICODONE) 5 MG immediate release tablet
5 | ORAL_TABLET | Freq: Four times a day (QID) | ORAL | 0.00 refills | 6.00000 days | Status: AC | PRN
Start: 2016-03-21 — End: 2016-04-30

## 2016-03-21 NOTE — Unmapped (Signed)
Allergies  Review of patient's allergies indicates no known allergies.    Medications  Outpatient Encounter Prescriptions as of 03/21/2016   Medication Sig Dispense Refill   ??? acetaminophen (TYLENOL) 325 MG tablet Take 3 tablets (975 mg total) by mouth every 8 hours as needed for Pain. 126 tablet 0   ??? HYDROcodone-acetaminophen (NORCO) 5-325 mg per tablet Take 1 tablet by mouth every 6 hours as needed for Pain. 40 tablet 0   ??? ibuprofen (ADVIL,MOTRIN) 600 MG tablet Take 1 tablet (600 mg total) by mouth 3 times a day with meals for 3 days. 9 tablet 0   ??? polyethylene glycol (GLYCOLAX) 17 gram/dose powder Mix 1 capful (17 g) in 8 ounces of liquid and drink 2 times a day. 255 g 0   ??? senna-docusate (SENNA-S) 8.6-50 mg per tablet Take 1 tablet by mouth 2 times a day. 30 tablet 0     No facility-administered encounter medications on file as of 03/21/2016.        Vitals  Blood pressure 111/66, pulse 105, temperature 98 ??F (36.7 ??C), resp. rate 16, height 5' 8 (1.727 m), weight 175 lb (79.379 kg), SpO2 99 %.    Histories  He has no past medical history on file.    He has past surgical history that includes Anterior cruciate ligament repair (Right); Wisdom tooth extraction; and open reduction  internal fixation acetabulum anterior (N/A, 03/04/2016).    His family history is not on file.    He reports that he has been smoking Cigars.  He does not have any smokeless tobacco history on file. He reports that he drinks alcohol. He reports that he uses illicit drugs (Marijuana).    The following portions of the patient's history were reviewed and updated as appropriate: allergies, current medications, past family history, past medical history, past social history, past surgical history and problem list.    Chief Complaint   Patient presents with   ??? Follow-up     discharge follow up s/p ct rib fx ptx         History of Present Illness  Colin [name] is a 55 y.o. male who suffered fall from ladder on 7/11.  Patient had multiple R  sided rib fx, R pneumo s/p chest tube and R pelvic Fx s/p exfix. Has followed up with ortho. Started to have some L knee pain after discharge and has L knee MRI Scheduled for tomorrow. Tolerating diet. Having BM. No CP or SOB.        Review of Systems   Constitutional: Negative for fever and chills.   Respiratory: Negative for chest tightness and shortness of breath.        Physical Exam   Constitutional: He appears well-developed and well-nourished.   HENT:   Head: Normocephalic and atraumatic.   Eyes: Conjunctivae and EOM are normal.   Neck: Normal range of motion. Neck supple.   Pulmonary/Chest: Effort normal. No respiratory distress.   Chest wall incision site well appearing. No surrounding erythema/edmea.    Abdominal: Soft. He exhibits no distension. There is no tenderness.   Musculoskeletal:   Ex-fix in place. L knee mildly edematous.        Neurologic Exam     Cranial Nerves     CN III, IV, VI   Extraocular motions are normal.       Ortho Exam    Wound Check:  Wound/ Incision: Clean & dry      Assessment & Plan  No diagnosis found.  Rib fx and pneumothorax resolved. Further management per orthopedics. Refilled patient's pain medication. Advised him to follow up PRN.         Medical Decision Making:  The following items were considered in medical decision making:  Review / order other diagnostic tests/interventions    Trauma Attending Attestation    I saw and evaluated the patient, and discussed with the resident. I agree with the resident???s findings and plan as documented in the resident???s note.    Pixie Casino, M.D. FACS  Division of Trauma, Surgical Critical Care and Acute Care Surgery   Department of Surgery   [facility]   Academic Office [phone]   Pager: [phone]

## 2016-03-21 NOTE — Unmapped (Signed)
Prescription & AVS reviewed with patient, states good understanding.  To follow up PRN.

## 2016-03-22 ENCOUNTER — Inpatient Hospital Stay: Admit: 2016-03-22 | Payer: PRIVATE HEALTH INSURANCE

## 2016-03-22 DIAGNOSIS — S83282D Other tear of lateral meniscus, current injury, left knee, subsequent encounter: Secondary | ICD-10-CM

## 2016-03-24 ENCOUNTER — Ambulatory Visit: Admit: 2016-03-24 | Discharge: 2016-03-24 | Payer: PRIVATE HEALTH INSURANCE

## 2016-03-24 DIAGNOSIS — S3282XD Multiple fractures of pelvis without disruption of pelvic ring, subsequent encounter for fracture with routine healing: Secondary | ICD-10-CM

## 2016-03-24 NOTE — Unmapped (Signed)
This office note has been dictated.

## 2016-03-24 NOTE — Unmapped (Signed)
The patient is 3 weeks status post repair of the right posterior pelvic disruption sacroiliac screws and anterior pelvic ring injury with an external fixator.  He's having a moderate amount of knee pain.  We obtained an MRI which demonstrates a left lateral meniscus tear which doesn't look too bad.  Given his significant arthritis of the knee. His pain is improving.  His pin sites are clean with just a little bit of marginal erythema.  His pelvis is stable AP and lateral compression.  Neurovascular exam LE - EHL, tibialis anterior and gastroc soleus complex are intact.  The light touch is intact dorsal, plantar, in the first web space, and palpable pulses in the dorsalis pedis and posterior tibia with cap refill brisk in all digits.     Assessment and plan: He is going to continue toe-touch weightbearing on the right weight-bear as tolerated on the left lower extremity.  We'll continue conservative treatment for the meniscus tear.  At this point.  It doesn't improve, we'll inject some steroid.  Otherwise I'll see him back in 4 weeks with an AP pelvis.  At that point things look good we'll schedule him for surgery fo

## 2016-04-01 ENCOUNTER — Ambulatory Visit: Admit: 2016-04-01 | Discharge: 2016-04-01 | Payer: PRIVATE HEALTH INSURANCE

## 2016-04-01 DIAGNOSIS — R102 Pelvic and perineal pain: Secondary | ICD-10-CM

## 2016-04-01 NOTE — Unmapped (Signed)
Physical Therapy Initial Evaluation and Plan of Care  Name: Colin [name]     Date of Birth: 13-Apr-1961      MRN: [id number]    Date of evaluation: 04/01/2016  Referring Physician: Andee Lineman, MD  [address]  [address]  [place], [place] [address] Phone: [phone] Fax: [phone]  Specific Order: PT for conservative treatment of L knee meniscal tear and general PT due to pelvic fracture on 03/03/16   Date of Onset: 03/03/16  Primary Medical Diagnosis:   1. Impaired functional mobility, balance, gait, and endurance       Insurance: Payor: [employer] / Plan: [employer] CHOICE / Product Type: *No Product type* /     Subjective/History:   History of Current Problem/Reason for Referral ): Colin [name] is a 55 y.o. male who presents today with chief complaint of pelvic fraction (external fix device) and TTWB on RLE, also with L knee pain/swelling due to lateral meniscal tear - here for conservative treatment. Presents with:   decreased A/P ROM   decreased A/P flexibility  decreased strength  decreased ambulation/ability to do stairs  presents with 8-9 /10 pain  Symptoms caused EX:BMWU from ladder, sustained R pelvic rim fracture, now with external fixation device in place.  Also, sustained L knee lateral meniscal tear.  The knee pain and swelling is preventing him from weight bearing on L. Pt reports he has not been able to bear weight on L knee since 03/13/16.  PMH/PSH: No past medical history on file.,   Past Surgical History   Procedure Laterality Date   ??? Anterior cruciate ligament repair Right    ??? Wisdom tooth extraction     ??? Open reduction  internal fixation acetabulum anterior N/A 03/04/2016     Procedure: PELVIC RING FIXATION;  Surgeon: Andee Lineman, MD;  Location: [facility] OR;  Service: Orthopedics;  Laterality: N/A;     Medications: He has a current medication list which includes the following prescription(s): acetaminophen, hydrocodone-acetaminophen, ibuprofen, oxycodone, polyethylene  glycol, and senna-docusate.  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  Works on Mining engineer.  Living Environment: own home, 0 stairs to negotiate, has front wheeled walker, w/c, and scooter  Work Status: Employed temporary disability due to injury  Prior Treatments:PT while in hospital  Precautions/Contraindications: fall, external fixation devise, TTWB on R due to pelvic fracture    Objective:   Posture: good seated posture  Mental Status: alert, appears stated age and cooperative  Learning Assessment:   [x]  Patient is able to communicate with therapist and verbalize understanding of directions/instructions    []  Patient has difficulty effectively communicating with therapist and/or verbalize understanding of directions/instructions due to the following barriers to learning:   []   Reading  [] Language  [] Visual  [] Hearing  []  Other:  Is an interpreter required? [] Yes [x]  No  Primary Language:    Gait: unable to assess due to TTWB on R and currently unable to bear weight on L (knee swollen and painful)  Edema: R 2 inches above knee:  38cm   R knee: 39 cm   L 2 inches above knee: 43cm    L knee: 41cm    Atrophy:   Not obvious, but pt feels likes leg muscles are wasting away  Pain:   Level (0-10): 2/10 at present (pelvic during sittng)   Level (0-10): 8-9/10 L knee with wt bearing or movement 10/10 at worst in past  month;  2-3/10 at best in past month (with advil use)  Location: general pelvis and L knee  Quality: sharp and aching    Relieved By: rest, advil   Exacerbated By: weight bearing on L knee, sitting too long, laying too long in once position  Additional Comments: not sleeping well, only 2 hours at a time due to pelvic/knee pain     LE   Strength:within functional limits or tested as follows: did not apply strong pressure with MMT due to pelvic fracture  Hip flexion:  Left: 4/5 Right: 3/5  Knee extension: Left: 2-/5 Right:  4/5  Knee flexion:  Left: 3/5 Right: 4/55  Ankle dorsiflexion: Left: 5/5 Right: 5/5  Ankle plantarflexion: Left: 5/5 Right: 5/5  LE Range of Motion:within functional limits or tested as follows: external fixator limits full hip ROM, swelling pain limiting   L knee ROM 38-115 degrees  Hip flexion:  Left: diminished range of motion  Right: diminished range of motion  Hip ABD:  Left: diminished range of motion  Right: diminished range of motion   Hip ER:  Left: diminished range of motion  Right: diminished range of motion   Hip IR:   Left: diminished range of motion  Right: diminished range of motion  Hip extension:  Left: diminished range of motion  Right: diminished range of motion  Knee extension: Left: diminished range with pain  Right: full range of motion  Knee flexion:  Left: diminished range with pain  Right: full range of motion  Ankle dorsiflexion: Left: full range of motion   Right: full range of motion  Ankle plantarflexion: Left: full range of motion   Right: full range of motion    Balance: good sitting balance, standing NT due to weight bearing limits     Assessment:   Assessment/Problem List: Patient presents with swollen/painful L knee (left lateral meniscus tear) and pelvic fracture with external fixation due to fall off ladder contributing to functional limitations including limited mobility, strength, and ROM . Patient would benefit from skilled PT to address the aforementioned deficits. Patient/family received education on the purpose of therapy, participated in the development of the POC and verbalized understanding and agreement of POC, goals.    Rehabilitation Potential: Based on this therapist's assessment, KARELL TUKES has the following rehabilitation potential for the PT goals stated below: : good  Plan Of Care:      Patient/Family Goals: By discharge, pt would like to:   Short Term Goals: To be met by 4 week  initial status   patient will improve L knee flexibility / ROM to full ROM in  order to ambulate pain free. L knee ROM 38-115 degrees   patient will ambulate 500 feet on all surfaces using axillary crutches independently . Unable to ambulate currently due to pain/swelling     longTerm Goals: To be met by 8 weeks Initial status   patient will reduce pain to 0 /10 on 0-10 pain scale to allow pain free movement. L knee pain 8-9/10 with movement   Decrease L knee swelling to normal range L knee swelling (see above for measurements)     Plan:    Treatment to Include: Therapeutic Exercise: 97110, Therapeutic Activities: 97530, Neuromuscular Re-education: [id number], Electrical Stimulation/unattended: 16109, Gait Training: [id number] and Manual Therapy: 97140     Patient/Family Agree to Treatment: Yes      Treatment Diagnosis : pelvic rim fracture (ext fix) and L knee lateral meniscal tear  Therapy Frequency/Duration:Patient to receive skilled PT services up to 2 times per week for up to 8 weeks or up to 16 visits starting from the first scheduled follow up visit within this plan of care.      Certification Period: 04/01/2016 - 90 days      Therapist Signature: Faustino Congress  Date: 04/01/2016    Physician Certification   I certify that the above patient is under my care an requires the above services. These professional services are to be provided from an established plan, related to the diagnosis and reviewed by me every 90 days.     Additional comments/revisions:     Physician Name: Andee Lineman, MD    Signature_______________________________________ Date: _______

## 2016-04-08 NOTE — Unmapped (Signed)
Diagnosis:   1. Impaired functional mobility, balance, gait, and endurance             Referring Provider:Archdeacon, Casimiro Needle, MD    Insurance plan:   Payor/Plan Subscr DOB Sex Relation Sub. Ins. ID Effective Group Num   1. GENERIC COMME* Colin [name] S [birth year] Male  [id number] 02/23/16 [id number]                                   [address], [place] [address]                         # of visits per insurance authorization:20  # of visits per POC: 16    Date of Initial Eval: 04/01/16    Goals of Therapy:      Assessment:    Assessment/Problem List: Patient presents with swollen/painful L knee (left lateral meniscus tear) and pelvic fracture with external fixation due to fall off ladder contributing to functional limitations including limited mobility, strength, and ROM . Patient would benefit from skilled PT to address the aforementioned deficits. Patient/family received education on the purpose of therapy, participated in the development of the POC and verbalized understanding and agreement of POC, goals.   Rehabilitation Potential: Based on this therapist's assessment, Colin [name] has the following rehabilitation potential for the PT goals stated below: : good   Plan Of Care:    Patient/Family Goals: By discharge, pt would like to:   Short Term Goals: To be met by 4 week  initial status    patient will improve L knee flexibility / ROM to full ROM in order to ambulate pain free.  L knee ROM 38-115 degrees    patient will ambulate 500 feet on all surfaces using axillary crutches independently .  Unable to ambulate currently due to pain/swelling      longTerm Goals: To be met by 8 weeks  Initial status    patient will reduce pain to 0 /10 on 0-10 pain scale to allow pain free movement.  L knee pain 8-9/10 with movement    Decrease L knee swelling to normal range  L knee swelling (see above for measurements)       ______________________________________________________________________  Pain:8-9/10 Location: L knee                         Precautions: TTWB RLE (physician instructed)    Activity Date: 04/01/16 Date:  Date: Date:    Visit # 1/16      G Code # (if needed)         CPT code  Daily treatment record Timed Coded Treatment Minutes   PT Evaluation: 97001 Eval completed, POC established 59'   Neuromuscular Re-education: 97112 -QS, HS, GS, gentle heel slides, SLR  -elevation, ice to L knees 15'    Total Treatment TIme:  57'     Additional Information:     Patient Education:   -positioning LLE  -beginning ROM/ther ex

## 2016-04-10 ENCOUNTER — Encounter: Admit: 2016-04-10 | Discharge: 2016-04-10 | Payer: PRIVATE HEALTH INSURANCE

## 2016-04-10 DIAGNOSIS — S3282XD Multiple fractures of pelvis without disruption of pelvic ring, subsequent encounter for fracture with routine healing: Secondary | ICD-10-CM

## 2016-04-10 NOTE — Unmapped (Signed)
PHYSICAL THERAPY PROGRESS NOTES -      Diagnosis:   1. Multiple closed fractures of pelvis without disruption of pelvic ring with routine healing, subsequent encounter     2. Peripheral tear of lateral meniscus of left knee, unspecified whether old or current tear, initial encounter             Referring Provider:Archdeacon, Casimiro Needle, MD    Insurance plan:   Payor/Plan Subscr DOB Sex Relation Sub. Ins. ID Effective Group Num   1. GENERIC COMME* Osmanovic,Burlon S [birth year] Male  [id number] 02/23/16 [id number]                                   [address], [place] [address]                         # of visits per insurance authorization:20  # of visits per POC: 16    Date of Initial Eval: 04/01/16    Goals of Therapy:      Assessment:    Assessment/Problem List: Patient presents with swollen/painful L knee (left lateral meniscus tear) and pelvic fracture with external fixation due to fall off ladder contributing to functional limitations including limited mobility, strength, and ROM . Patient would benefit from skilled PT to address the aforementioned deficits. Patient/family received education on the purpose of therapy, participated in the development of the POC and verbalized understanding and agreement of POC, goals.   Rehabilitation Potential: Based on this therapist's assessment, MACLIN GUERRETTE has the following rehabilitation potential for the PT goals stated below: : good   Plan Of Care:    Patient/Family Goals: By discharge, pt would like to:   Short Term Goals: To be met by 4 week  initial status    patient will improve L knee flexibility / ROM to full ROM in order to ambulate pain free.  L knee ROM 38-115 degrees    patient will ambulate 500 feet on all surfaces using axillary crutches independently .  Unable to ambulate currently due to pain/swelling      longTerm Goals: To be met by 8 weeks  Initial status    patient will reduce pain to 0 /10 on 0-10 pain scale to allow pain free movement.  L knee pain 8-9/10 with movement     Decrease L knee swelling to normal range  L knee swelling (see above for measurements)       ______________________________________________________________________  Pain:8-9/10 Location: L knee                        Precautions: TTWB RLE (physician instructed)    Activity Date: 04/01/16 Date:  Date: Date:    Visit # 1/16      G Code # (if needed)         CPT code  Daily treatment record Timed Coded Treatment Minutes   PT Evaluation: 97001 Eval completed, POC established 53'   Neuromuscular Re-education: 97112 -QS, HS, GS, gentle heel slides, SLR  -elevation, ice to L knees 15'    Total Treatment TIme:  47'         Pain:   L knee pain flexed 2/10; extended 9/10  Visit #: 1 / 6-12    Exercise Log:   04/10/16    v2      HS supine str R only H60x1      HS seated str L only H15x2      gastroc str Difficult d/t BLE / WB restrictions      Marching Supine  X10;   Seated  x20      SLR's R  X5;   L x4 w/ mod A      LAQ's L max A d/t pain  x5      AP's / circles cw/ccw x20  each      GS / QS H5x5 each             CP / L knee -      sci-fit -        Subjective:  ...I fell off a roof getting onto a ladder and broke my pelvis - and that's going okay but I've got a L knee meniscal tear now....  Objective:  Treatment per flow sheet - he was able to demonstrate portion of his therex regimen today - needed correctional instructions / cuing for safe technique.  Added seated / supine therapeutic interventions and modified where needed d/t inability to fully extend L knee.  Assessment:  FAIR tolerance to today's challenges - needed extra time to perform LLE / TE - d/t L knee pain / weakness  Plan:  Continue with POC - advancing therex challenges1-2x/week x 6 weeks / GENERIC        MINUTES of TREATMENT   Evaluation/ Re evaluation    Therapeutic Exercise 42   Therapeutic Activity    Gait Training    Manual Therapy    Iontophoresis    Ultrasound    Electrical Stimulation    Neuromuscular Rehab  (balance training)    Ice/ Heat    Total Treatment Time 42

## 2016-04-21 ENCOUNTER — Ambulatory Visit: Admit: 2016-04-21 | Discharge: 2016-04-21 | Payer: PRIVATE HEALTH INSURANCE

## 2016-04-21 DIAGNOSIS — M25562 Pain in left knee: Secondary | ICD-10-CM

## 2016-04-21 NOTE — Unmapped (Signed)
Colin [name] is  now 2 months status post right sided posterior sacroiliac screws and an anterior pelvic external fixator 1st, pelvic ring disruption.   The patient is doing very well, complaining of mild to moderate pain, ambulating weight-bear as tolerated on the left and nonweightbearing on the right and improving weekly.      PMH and ROS are signed and dated in the chart and will not be re-dictated.      PE:  The patient is awake, alert, and oriented and in no acute distress.  Gait demonstrates nonambulatory the office today in a wheelchair.  The affected pelvis demonstrates minimal tenderness with palpation.  ROM exam is as follows: Hip flexion 210?? of full extension and 45?? of internal and external rotation on both hips.  Examination of instability demonstrates no instability the pelvis to AP and lateral compression.  The neuromuscular and vascular exam is as follows: Neurovascular exam demonstrates intact sensation dorsal, plantar and in the 1st web space. Motor function is 5 of 5 strength in dorsiflexion, plantarflexion, extensor pollicis longus, toe flexors and toe extensors. Pulses are palpable with good cap refill.  The skin shows mild irritation around the external fixator pin sites.    ASSESSMENT AND PLAN:  Patient is coming along fairly well and is to continue activities as directed, including planning  are for removal the external fixator.  He has a left knee meniscus tear and I'm going to send him see Dr. Henreitta Cea for arthroscopy.  Ideally we can schedule him for removal the external fixer at the same time of his left knee arthroscopy..  PT and pain medicine have been discussed and prescriptions have been distributed.  Patient will return in 1 month, and at that point, we will need the following radiographs: AP pelvis.  We have reviewed our pain medicine protocol.  The patient understands and agrees with our plan and will call the office with any questions.

## 2016-04-21 NOTE — Unmapped (Signed)
Colin [name] is  now 2 months status post right sided posterior sacroiliac screws and an anterior pelvic external fixator 1st, pelvic ring disruption.   The patient is doing very well, complaining of mild to moderate pain, ambulating weight-bear as tolerated on the left and nonweightbearing on the right and improving weekly.      PMH and ROS are signed and dated in the chart and will not be re-dictated.      PE:  The patient is awake, alert, and oriented and in no acute distress.  Gait demonstrates nonambulatory the office today in a wheelchair.  The affected pelvis demonstrates minimal tenderness with palpation.  ROM exam is as follows: Hip flexion 210?? of full extension and 45?? of internal and external rotation on both hips.  Examination of instability demonstrates no instability the pelvis to AP and lateral compression.  The neuromuscular and vascular exam is as follows: Neurovascular exam demonstrates intact sensation dorsal, plantar and in the 1st web space. Motor function is 5 of 5 strength in dorsiflexion, plantarflexion, extensor pollicis longus, toe flexors and toe extensors. Pulses are palpable with good cap refill.  The skin shows mild irritation around the external fixator pin sites.    ASSESSMENT AND PLAN:  Patient is coming along fairly well and is to continue activities as directed, including planning [place] are for removal the external fixator.  He has a left knee meniscus tear and I'm going to send him see Dr. Henreitta Cea for arthroscopy.  Ideally we can schedule him for removal the external fixer at the same time of his left knee arthroscopy..  PT and pain medicine have been discussed and prescriptions have been distributed.  Patient will return in 1 month, and at that point, we will need the following radiographs: AP pelvis.  We have reviewed our pain medicine protocol.  The patient understands and agrees with our plan and will call the office with any questions.

## 2016-04-21 NOTE — Unmapped (Signed)
This office note has been dictated.

## 2016-04-23 ENCOUNTER — Ambulatory Visit: Admit: 2016-04-23 | Discharge: 2016-04-23 | Payer: Legal Liability / Liability Insurance | Attending: Sports Medicine

## 2016-04-23 ENCOUNTER — Inpatient Hospital Stay: Admit: 2016-04-23 | Payer: Legal Liability / Liability Insurance | Attending: Sports Medicine

## 2016-04-23 DIAGNOSIS — M1712 Unilateral primary osteoarthritis, left knee: Secondary | ICD-10-CM

## 2016-04-23 MED ORDER — bupivacaine (MARCAINE) 0.5 % (5 mg/mL) injection 15 mg
0.5 | Freq: Once | INTRAMUSCULAR | Status: AC
Start: 2016-04-23 — End: 2016-04-23
  Administered 2016-04-23: 21:00:00 15 mL via SUBCUTANEOUS

## 2016-04-23 MED ORDER — triamcinolone acetonide (KENALOG-40) injection 80 mg
40 | Freq: Once | INTRAMUSCULAR | Status: AC
Start: 2016-04-23 — End: 2016-04-23
  Administered 2016-04-23: 21:00:00 80 mg via INTRA_ARTICULAR

## 2016-04-23 NOTE — Unmapped (Signed)
Cortisone injection drawn, timeout completed,  patient prepped and assisted physician with injection.   Allergies were reviewed.  Pt cleaned with alcohol and band aid applied.  Procedure explained to pt and all questions answered.A verbal consent was obtained and a verbal timeout was performed on 04/23/2016 at 1:41 PM.      Durwin Reges, MA     PROCEDURE NOTE: After explanation of the risks, benefits, and alternatives, the patient consented for an intra-articular knee injection. The lateral parapatellar aspect of the left knee was prepped with Betadine. The knee was then sterilely injected with 80 mg of Kenalog and 3 mL of Marcaine using a lateral parapatellar access point. The wound was cleaned with alcohol and dressed with a Band-Aid. The patient tolerated the procedure well without any complications.    Gayla Medicus, MD

## 2016-04-23 NOTE — Unmapped (Signed)
[facility]     PATIENT NAME:  Colin [name], Colin [name]                  MRN:  [id number]  DATE OF BIRTH:  10/09/1960                       CSN:  [id number]  PROVIDER:  Gayla Medicus, M.D.                 VISIT DATE:  04/23/2016                                       OFFICE NOTE     CHIEF COMPLAINT:  Left knee pain.     HISTORY:  Mr. Pridgen is a 55 year old gentleman who comes into clinic today  at the referral of my partner, Dr. Cloyde Reams, for left knee pain.  The  patient states that he has had chronic knee pain prior to a recent injury  where he had a pelvic fracture.  He states he started weightbearing on his  left leg as he was nonweightbearing on the right and a few weeks later  started to have significant pain in his left knee.  He also had significant  swelling and was unable to put weight on it.  He states he was unable to move  it significantly.  He states the swelling has gradually improved since then,  as has his pain.  He has been reluctant to put too much weight on it as he is  worried.  He had an MRI and was referred to me for further evaluation and  treatment.  Denies any numbness or tingling distally.  Denies any true  locking or catching.     The patient's past medical, surgical, family and social history are all  reviewed per the Epic chart.  His allergies and medications were also  reviewed.     PHYSICAL EXAMINATION:  The patient appears stated age, is in no acute  distress.  He is alert and oriented x3 with an appropriate mood and affect.  Examination of his skin shows no rash, ecchymosis or abrasions.  The patient  does have a pelvic ex-fixator.     Examination of the patient's left knee shows range of motion from 5 degrees  short of full extension to 110 degrees of flexion.  He has a mild effusion.  He has tenderness predominantly about the lateral joint line.  His knee has  mild valgus deformity.  This is  correctable passively.  He is otherwise  ligamentously stable.  He is neurovascularly intact distally with a palpable  DP pulse.     IMAGING STUDIES:  The patient had multiple plain view x-rays of his knee  today.  These show severe tricompartmental arthrosis with obliteration of the  joint space laterally.  He also has some possible loose bodies.  There are no  fractures or subluxations.     The patient had an MRI which was dated 03/22/2016.  Review of this shows again  severe tricompartmental arthrosis with full-thickness cartilage loss along  the lateral femoral condyle and lateral tibial plateau.  He has  full-thickness cartilage loss along the anterior aspect of the medial tibial  plateau and  partial-thickness chondral changes on the medial femoral condyle.  Patella chondral is only mildly effected.  He does have a moderate sized  joint effusion and multiple intra-articular loose bodies.  He has a macerated  lateral meniscus tear which would be consistent with degenerative meniscal  tearing.     ASSESSMENT:  Left knee severe tricompartmental arthritis.     PLAN:  I went over the imaging results with the patient.  At this point I do  not think he would benefit from a knee arthroscopy as his arthritis is quite  severe.  I have recommended conserved treatment as he is still undergoing  recovery from his pelvic fractures.  We did provide him with an  intra-articular steroid injection today per the procedure note in Epic.  I  think that if he continues to have problems with this in the future I would  recommend total knee arthroplasty.  Everything was explained in detail to the  patient who voiced understanding, agreement with the plan.  He will follow-up  with Dr. Cloyde Reams for his pelvic fractures.                                                 Gayla Medicus, M.D.  CU/nwc  D:  04/23/2016 17:48  T:  04/28/2016 12:27  Job #:  [id number]           OFFICE NOTE                                                  PAGE    1 of    1

## 2016-04-23 NOTE — Unmapped (Signed)
This office note has been dictated.

## 2016-04-30 NOTE — Unmapped (Signed)
Pre op instructions were given.  Arrive at [facility] on 05/02/16 at 0530 for 0730 surgery. He denies any changes since his previous surgery 03/04/16 (except for his med's).

## 2016-05-01 MED FILL — CEFAZOLIN 1 GRAM SOLUTION FOR INJECTION: 1 1 g | INTRAMUSCULAR | Qty: 1000

## 2016-05-01 NOTE — Unmapped (Addendum)
[place]  DEPARTMENT OF ANESTHESIOLOGY  PRE-PROCEDURAL EVALUATION    Colin [name] is a 55 y.o. year old male presenting for:    Procedure(s):  STAGED REMOVAL PELVIC EXTERNAL FIXATOR    Surgeon:   Andee Lineman, MD    Chief Complaint     05/02/16 Multiple closed fractures of pelvis without disruption of pelvic ri*    Review of Systems     Anesthesia Evaluation    Patient summary reviewed and Previous anesthesia note reviewed.  All other systems reviewed and are negative.     No history of anesthetic complications   I have reviewed the History and Physical Exam, any relevant changes are noted in the anesthesia pre-operative evaluation.      Cardiovascular:      (-) hypertension, past MI, CAD, angina.    Neuro/Muscoloskeletal/Psych:    (+) arthritis (Left knee).    (-) seizures, neuromuscular disease, CVA, psychiatric history, back problems.   Comments: Pt with ~30ft fall from ladder in July resulting in multiple closed fractures and PTX of pelvis s/p external fixation of pelvis and chest tube placement.    Pulmonary:      (-) COPD, asthma, shortness of breath, recent URI, sleep apnea.   ROS comment: Former occasional smoker (cigars), last use 3 months ago          GI/Hepatic/Renal:      (-) GERD, liver disease, renal disease.    Endo/Other:    (+) anemia (H/H= 7.6/21.9).      (-) diabetes mellitus, hypothyroidism, hyperthyroidism, no bleeding disorder, no clotting disorder. No opiate use      Past Medical History     History reviewed. No pertinent past medical history.    Past Surgical History     Past Surgical History   Procedure Laterality Date   ??? Anterior cruciate ligament repair Right    ??? Wisdom tooth extraction     ??? Open reduction  internal fixation acetabulum anterior N/A 03/04/2016     Procedure: PELVIC RING FIXATION;  Surgeon: Andee Lineman, MD;  Location: [facility] OR;  Service: Orthopedics;  Laterality: N/A;       Family History     History reviewed. No pertinent family history.    Social History      Social History     Social History   ??? Marital Status: Divorced     Spouse Name: N/A   ??? Number of Children: N/A   ??? Years of Education: N/A     Occupational History   ??? Not on file.     Social History Main Topics   ??? Smoking status: Current Every Day Smoker -- 0.25 packs/day for 10 years     Types: Cigars   ??? Smokeless tobacco: Not on file   ??? Alcohol Use: Yes      Comment: occasionally   ??? Drug Use: Yes     Special: Marijuana      Comment: last use couple weeks ago   ??? Sexual Activity: Not on file     Other Topics Concern   ??? Not on file     Social History Narrative       Medications     Allergies:  No Known Allergies    Home Meds:  Prior to Admission medications as of 05/01/16 1559   Medication Sig Taking?   acetaminophen (TYLENOL) 325 MG tablet Take 3 tablets (975 mg total) by mouth every 8 hours as needed for Pain. Yes  IBUPROFEN/DIPHENHYDRAMINE CIT (ADVIL PM ORAL) Take by mouth at bedtime. Yes       Inpatient Meds:  Scheduled:   Continuous:     PRN:     Vital Signs     Wt Readings from Last 3 Encounters:   04/21/16 175 lb (79.379 kg)   03/24/16 175 lb (79.379 kg)   03/21/16 175 lb (79.379 kg)     Ht Readings from Last 3 Encounters:   04/23/16 5' 8 (1.727 m)   04/21/16 5' 8 (1.727 m)   03/24/16 5' 8 (1.727 m)     Temp Readings from Last 3 Encounters:   03/21/16 98 ??F (36.7 ??C)    03/07/16 98.7 ??F (37.1 ??C) Oral     BP Readings from Last 3 Encounters:   03/21/16 111/66   03/07/16 133/95     Pulse Readings from Last 3 Encounters:   03/21/16 105   03/07/16 97     SpO2 Readings from Last 3 Encounters:   03/21/16 99%   03/07/16 99%       Physical Exam     Airway:     Mallampati: III  Mouth Opening: >2 FB  TM distance: > = 3 FB  Neck ROM: full    Dental:   - No obvious cracked, loose, chipped, or missing teeth.     Pulmonary:      (+) wheezes.    Cardiovascular:     Rhythm: regular    Neuro/Musculoskeletal/Psych:    Mental status: alert and oriented to person, place and time.          Abdominal:     Abdomen:  soft.    Current OB Status:       Other Findings:        Laboratory Data     Lab Results   Component Value Date    WBC 9.5 03/06/2016    HGB 7.6* 03/06/2016    HCT 21.9* 03/06/2016    MCV 87.5 03/06/2016    PLT 253 03/06/2016       No results found for: [facility]    Lab Results   Component Value Date    GLUCOSE 169* 03/04/2016    BUN 18 03/04/2016    CO2 25 03/04/2016    CREATININE 0.95 03/04/2016    K 5.0 03/04/2016    NA 142 03/04/2016    CL 111* 03/04/2016    CALCIUM 8.1* 03/04/2016    ALBUMIN 3.4* 03/04/2016    PROT 5.7* 03/04/2016    ALKPHOS 43 03/04/2016    ALT 15 03/04/2016    AST 34 03/04/2016    BILITOT 0.3 03/04/2016       Lab Results   Component Value Date    INR 1.1 03/04/2016       No results found for: PREGTESTUR, PREGSERUM, HCG, HCGQUANT    Anesthesia Plan     ASA 2       Opiate use  Planned PONV prophylaxis.    Anesthesia Type:  general LMA.         Intravenous induction.    Anesthetic plan and risks discussed with patient.    Plan, alternatives, and risks of anesthesia, including death, have been explained to and discussed with the patient/legal guardian.  By my assessment, the patient/legal guardian understands and agrees.  Scenario presented in detail.  Questions answered.    Use of blood products discussed with patient whom consented to blood products.   Plan discussed with CRNA, attending and RNSA.

## 2016-05-02 ENCOUNTER — Ambulatory Visit: Admit: 2016-05-02 | Payer: PRIVATE HEALTH INSURANCE

## 2016-05-02 LAB — CBC
Hematocrit: 35.5 % (ref 38.5–50.0)
Hemoglobin: 11.7 g/dL (ref 13.2–17.1)
MCH: 27.4 pg (ref 27.0–33.0)
MCHC: 33 g/dL (ref 32.0–36.0)
MCV: 83.2 fL (ref 80.0–100.0)
MPV: 6.9 fL (ref 7.5–11.5)
Platelets: 373 10*3/uL (ref 140–400)
RBC: 4.27 10*6/uL (ref 4.20–5.80)
RDW: 17.7 % (ref 11.0–15.0)
WBC: 12.5 10*3/uL (ref 3.8–10.8)

## 2016-05-02 LAB — ABO/RH: Rh Type: NEGATIVE

## 2016-05-02 LAB — ANTIBODY SCREEN: Antibody Screen: NEGATIVE

## 2016-05-02 MED ORDER — fentaNYL (SUBLIMAZE) injection 12.5 mcg
50 | INTRAMUSCULAR | Status: AC | PRN
Start: 2016-05-02 — End: 2016-05-02

## 2016-05-02 MED ORDER — midazolam (PF) (VERSED) injection
1 | INTRAMUSCULAR | Status: AC | PRN
Start: 2016-05-02 — End: 2016-05-02
  Administered 2016-05-02: 11:00:00 2 via INTRAVENOUS

## 2016-05-02 MED ORDER — HYDROmorphone (DILAUDID) injection Syrg 0.1 mg
1 | INTRAMUSCULAR | Status: AC | PRN
Start: 2016-05-02 — End: 2016-05-02

## 2016-05-02 MED ORDER — dexamethasone (DECADRON) injection 4 mg
4 | Freq: Once | INTRAMUSCULAR | Status: AC | PRN
Start: 2016-05-02 — End: 2016-05-02

## 2016-05-02 MED ORDER — fentaNYL (SUBLIMAZE) injection 25 mcg
50 | INTRAMUSCULAR | Status: AC | PRN
Start: 2016-05-02 — End: 2016-05-02

## 2016-05-02 MED ORDER — peppermint oil liquid 1 mL
Status: AC | PRN
Start: 2016-05-02 — End: 2016-05-02

## 2016-05-02 MED ORDER — lidocaine (PF) 20 mg/mL (2 %) Soln
20 | INTRAVENOUS | Status: AC | PRN
Start: 2016-05-02 — End: 2016-05-02
  Administered 2016-05-02: 11:00:00 80 via INTRAVENOUS

## 2016-05-02 MED ORDER — HYDROmorphone (DILAUDID) injection Syrg 0.2 mg
1 | INTRAMUSCULAR | Status: AC | PRN
Start: 2016-05-02 — End: 2016-05-02
  Administered 2016-05-02: 13:00:00 0.2 mg via INTRAVENOUS

## 2016-05-02 MED ORDER — fentaNYL (SUBLIMAZE) injection 50 mcg
50 | INTRAMUSCULAR | Status: AC | PRN
Start: 2016-05-02 — End: 2016-05-02
  Administered 2016-05-02 (×2): 50 ug via INTRAVENOUS

## 2016-05-02 MED ORDER — naloxone (NARCAN) injection 0.04 mg
0.4 | INTRAMUSCULAR | Status: AC | PRN
Start: 2016-05-02 — End: 2016-05-02

## 2016-05-02 MED ORDER — ondansetron (ZOFRAN) 4 mg/2 mL injection
4 | INTRAMUSCULAR | Status: AC | PRN
Start: 2016-05-02 — End: 2016-05-02
  Administered 2016-05-02: 12:00:00 4 via INTRAVENOUS

## 2016-05-02 MED ORDER — ceFAZolin (ANCEF) 1 g in sodium chloride 0.9% 100 mL ADDaptor IVPB
INTRAVENOUS | Status: AC | PRN
Start: 2016-05-02 — End: 2016-05-02
  Administered 2016-05-02: 12:00:00 1 g via INTRAVENOUS

## 2016-05-02 MED ORDER — propofol 10 mg/ml (DIPRIVAN) injection
10 | INTRAVENOUS | Status: AC | PRN
Start: 2016-05-02 — End: 2016-05-02
  Administered 2016-05-02: 11:00:00 50 via INTRAVENOUS
  Administered 2016-05-02: 11:00:00 200 via INTRAVENOUS
  Administered 2016-05-02: 12:00:00 30 via INTRAVENOUS

## 2016-05-02 MED ORDER — proMETHazine (PHENERGAN) injection 6.25 mg
25 | Freq: Four times a day (QID) | INTRAMUSCULAR | Status: AC | PRN
Start: 2016-05-02 — End: 2016-05-02

## 2016-05-02 MED ORDER — fentaNYL (SUBLIMAZE) injection
50 | INTRAMUSCULAR | Status: AC | PRN
Start: 2016-05-02 — End: 2016-05-02
  Administered 2016-05-02: 12:00:00 100 via INTRAVENOUS
  Administered 2016-05-02: 12:00:00 50 via INTRAVENOUS

## 2016-05-02 MED ORDER — gabapentin (NEURONTIN) capsule 900 mg
300 | Freq: Once | ORAL | Status: AC
Start: 2016-05-02 — End: 2016-05-02
  Administered 2016-05-02: 11:00:00 900 mg via ORAL

## 2016-05-02 MED ORDER — HYDROmorphone (DILAUDID) injection Syrg 0.2 mg
1 | INTRAMUSCULAR | Status: AC | PRN
Start: 2016-05-02 — End: 2016-05-02

## 2016-05-02 MED ORDER — oxyCODONE-acetaminophen (PERCOCET) 5-325 mg per tablet 3 tablet
5-325 | Freq: Once | ORAL | Status: AC
Start: 2016-05-02 — End: 2016-05-02
  Administered 2016-05-02: 11:00:00 3 via ORAL

## 2016-05-02 MED ORDER — ondansetron (ZOFRAN) 4 mg/2 mL injection 4 mg
4 | Freq: Four times a day (QID) | INTRAMUSCULAR | Status: AC | PRN
Start: 2016-05-02 — End: 2016-05-02

## 2016-05-02 MED ORDER — HYDROmorphone (DILAUDID) injection Syrg 0.4 mg
1 | INTRAMUSCULAR | Status: AC | PRN
Start: 2016-05-02 — End: 2016-05-02
  Administered 2016-05-02: 13:00:00 0.4 mg via INTRAVENOUS

## 2016-05-02 MED ORDER — celecoxib (CELEBREX) capsule 400 mg
200 | Freq: Once | ORAL | Status: AC
Start: 2016-05-02 — End: 2016-05-02
  Administered 2016-05-02: 11:00:00 400 mg via ORAL

## 2016-05-02 MED ORDER — dexamethasone (DECADRON) injection
4 | INTRAMUSCULAR | Status: AC | PRN
Start: 2016-05-02 — End: 2016-05-02
  Administered 2016-05-02: 12:00:00 8 via INTRAVENOUS

## 2016-05-02 MED ORDER — HYDROmorphone (DILAUDID) injection Syrg 0.6 mg
1 | INTRAMUSCULAR | Status: AC | PRN
Start: 2016-05-02 — End: 2016-05-02

## 2016-05-02 MED ORDER — oxyCODONE (ROXICODONE) immediate release tablet 5 mg
5 | Freq: Once | ORAL | Status: AC | PRN
Start: 2016-05-02 — End: 2016-05-02

## 2016-05-02 MED ORDER — lactated Ringers infusion
INTRAVENOUS | Status: AC | PRN
Start: 2016-05-02 — End: 2016-05-02
  Administered 2016-05-02: 10:00:00 via INTRAVENOUS

## 2016-05-02 MED ORDER — HYDROcodone-acetaminophen (NORCO) 5-325 mg per tablet
5-325 | ORAL_TABLET | Freq: Four times a day (QID) | ORAL | 0.00 refills | 15.50000 days | Status: AC | PRN
Start: 2016-05-02 — End: ?

## 2016-05-02 MED ORDER — fentaNYL (SUBLIMAZE) injection 6.5 mcg
50 | INTRAMUSCULAR | Status: AC | PRN
Start: 2016-05-02 — End: 2016-05-02

## 2016-05-02 MED ORDER — oxyCODONE (ROXICODONE) immediate release tablet 10 mg
5 | Freq: Once | ORAL | Status: AC | PRN
Start: 2016-05-02 — End: 2016-05-02

## 2016-05-02 MED ORDER — bacitracin-polymyxin b (POLYSPORIN) ointment (bulk)
500-10000 | TOPICAL | Status: AC | PRN
Start: 2016-05-02 — End: 2016-05-02
  Administered 2016-05-02: 12:00:00 1 via TOPICAL

## 2016-05-02 MED FILL — OXYCODONE-ACETAMINOPHEN 5 MG-325 MG TABLET: 5-325 5-325 mg | ORAL | Qty: 3

## 2016-05-02 MED FILL — HYDROMORPHONE 1 MG/ML INJECTION SYRINGE: 1 1 mg/mL | INTRAMUSCULAR | Qty: 1

## 2016-05-02 MED FILL — GABAPENTIN 300 MG CAPSULE: 300 300 MG | ORAL | Qty: 3

## 2016-05-02 MED FILL — CEFAZOLIN 1 GRAM SOLUTION FOR INJECTION: 1 1 g | INTRAMUSCULAR | Qty: 1000

## 2016-05-02 MED FILL — FENTANYL (PF) 50 MCG/ML INJECTION SOLUTION: 50 50 mcg/mL | INTRAMUSCULAR | Qty: 2

## 2016-05-02 MED FILL — CELEBREX 200 MG CAPSULE: 200 200 mg | ORAL | Qty: 2

## 2016-05-02 NOTE — Unmapped (Signed)
Pt meets discharge criteria.  VSS. Respirations even and unlabored. Site assessment see LDA  ALS 4/10

## 2016-05-02 NOTE — Unmapped (Signed)
Pt meets discharge criteria.  VSS, A&O, RA, 0/10 pain.  Pt and friend verbalize understanding of discharge instructions.

## 2016-05-02 NOTE — Unmapped (Signed)
STAGED REMOVAL PELVIC EXTERNAL FIXATOR  Procedure Note    [name]  05/02/2016      Pre-op Diagnosis: Multiple closed fractures of pelvis without disruption of pelvic ring with routine healing, subsequent encounter [S32.82XD]       Post-op Diagnosis: same    Procedure(s):  STAGED REMOVAL PELVIC EXTERNAL FIXATOR      Surgeon(s):  Andee Lineman, MD    Anesthesia: General    Staff:   Circulator: Dionisio Mohd., RN  Scrub Person: Darin Engels, ST  2nd Circulator: Rogue Jury, RN  2nd Scrub: Marylou Mccoy, RN  Assistant: Gus Height, CST  Resident: Clarene Essex, MD    Estimated Blood Loss: Minimal                 Specimens:            Drains:             There were no complications unless listed below.        Tayvin Preslar  O'BRIEN     Date: 05/02/2016  Time: 7:57 AM

## 2016-05-02 NOTE — Unmapped (Signed)
Anesthesia Post Note    Patient: Colin [name]    Procedure(s) Performed: Procedure(s):  STAGED REMOVAL PELVIC EXTERNAL FIXATOR    Anesthesia type: general LMA    Patient location: PACU    Post pain: Adequate analgesia    Post assessment: no apparent anesthetic complications and tolerated procedure well    Last Vitals:   Filed Vitals:    05/02/16 0759 05/02/16 0808 05/02/16 0813 05/02/16 0830   BP: 106/80 119/84 121/86 122/92   Pulse: 91 83 83 75   Temp: 97.2 ??F (36.2 ??C)      TempSrc: Temporal      Resp: 12 16 21 13    Height:       Weight:       SpO2: 100% 100% 99% 100%        Post vital signs: stable    Level of consciousness: awake and alert     Complications: None

## 2016-05-02 NOTE — Unmapped (Signed)
[facility] Department of Orthopaedics and Sports Medicine  Dr. Archdeacon's Post Operative Discharge Instructions  Lower Extremity      Patient Name: Colin [name]    Date: 04/04/2016    Procedure: Removal pelvic ex fix                            Weight Bearing Status: WBAT BLE    Discharge Medications:  _x_ Norco 5/325 mg 1-2 tabs every 4-6 hours as needed for pain      - Do not remove dressing for 72 hours. Then ok to remove for shower. After shower cover with dry sterile dressing or bandaid.  - Ice 20 minutes every hour you are awake.  - Resume a regular diet.      - DO NOT TAKE A BATH OR SOAK YOUR WOUND.    Call the office with any questions or concerns!  Office Phone number:  [phone]     Please Make Follow-Up Appointment with Dr Cloyde Reams for 2 weeks post op. 05/19/16 @830AM  at [facility]

## 2016-05-02 NOTE — Unmapped (Signed)
Admit to pacu per protocol. Received report from Anesthesia at [facility].

## 2016-05-02 NOTE — Unmapped (Signed)
H&P reviewed, patient examined, no changes to H&P.    55 yo male pt of mine 2 months s/p pelvic ex fix    Plan:  I have seen and evaluated this patient today.   OR today for staged removal pelvic ex fix.  The patient and I have discussed the risks, benefits, and alternative procedures of operative versus nonoperative treatment.  I've given the patient the opportunity to ask questions.  I've answered all of his questions.  He understands the risks of the operation as well as the potential benefits.  I've asked him to sign an informed surgical consent which he has signed.

## 2016-05-02 NOTE — Unmapped (Signed)
STAGED REMOVAL PELVIC EXTERNAL FIXATOR  Procedure Note    [name]  05/02/2016      Pre-op Diagnosis: Multiple closed fractures of pelvis without disruption of pelvic ring with routine healing, subsequent encounter [S32.82XD]       Post-op Diagnosis: same    Procedure(s):  STAGED REMOVAL PELVIC EXTERNAL FIXATOR      Surgeon(s):  Andee Lineman, MD    Anesthesia: General    Staff:   Circulator: Dionisio Edna, RN  Scrub Person: Darin Engels, ST  2nd Circulator: Rogue Jury, RN  2nd Scrub: Marylou Mccoy, RN  Assistant: Gus Height, CST  Resident: Clarene Essex, MD    Estimated Blood Loss: Minimal                 Specimens:            Drains:             There were no complications unless listed below.        Colin [name]     Date: 05/02/2016  Time: 8:00 AM

## 2016-05-02 NOTE — Unmapped (Signed)
[facility]                      [facility]     PATIENT NAME:   AUGUST, LONGEST               MRN: [id number]  DATE OF BIRTH:  [birth year]                     CSN: [id number]  SURGEON:        Andee Lineman, M.D.       ADMIT DATE: 05/02/2016  SERVICE:        Orthopaedic Surgery and Sports Med  DICTATED BY:    Andee Lineman, M.D.       SURGERY DATE: 05/02/2016                                    OPERATIVE REPORT     PREOPERATIVE DIAGNOSIS(ES):  1. Mechanical complication of internal orthopedic device, implant or graft     (painful anterior pelvic external fixator).  ICD-9 code 996.40.  2. Multiple closed pelvic fractures with disruption of pelvic circle     (anterior and posterior pelvic ring injury status post right sacroiliac     screws and anterior pelvic external fixator).  ICD-9 code 808.43.     POSTOPERATIVE DIAGNOSIS(ES):  1. Mechanical complication of internal orthopedic device, implant or graft     (painful anterior pelvic external fixator).  ICD-9 code 996.40.  2. Multiple closed pelvic fractures with disruption of pelvic circle     (anterior and posterior pelvic ring injury status post right sacroiliac     screws and anterior pelvic external fixator).  ICD-9 code 808.43.     PROCEDURE(S) PERFORMED:   Staged removal under anesthesia of external  fixation systems (anterior pelvic external fixator).  CPT code 78295.62     ATTENDING SURGEON:  Andee Lineman, M.D.     ASSISTANT(S):  Clarene Essex, M.D.     ANESTHESIA:   General via laryngeal mask airway.     ESTIMATED BLOOD LOSS:  Minimal.     COMPLICATION(S):  None.     INDICATION(S):   This is a 55 year old male patient of  mine who had  sustained a pelvic ring injury treated with posterior sacroiliac screws on  the right side and anterior pelvic external fixator.  The fixator had been on  for 8 weeks.  He was having pin site pain and difficulty with the fixator and  it was felt it was  time to remove the fixator.  The risks, benefits and  alternatives of the procedure of operative versus nonoperative treatment were  discussed and informed consent was signed and obtained and the patient was  taken to surgery.     DETAILS OF PROCEDURE(S):  The patient was taken to the operating room,  general anesthesia induced via laryngeal mask airway.  The patient received a  gram of IV cefazolin prior to the initiation of the surgical procedure.  A  formal timeout procedure was then performed to confirm the anterior pelvic  ring and informed consent documented.     A fluoroscopic view of the anterior pelvis was obtained.  The external  fixator was deconstructed, the pelvis was stressed.  It was noted to have 3-4  mm of motion through the anterior pubic symphysis with compression and  distraction, as well as  vertical load.  This was felt to be acceptable.  The  pins were then removed from the ilium on both the right and left side.  The  pin sites were curetted.  Sterile compressive dressings were applied.  The  patient tolerated the procedure well and was taken to the PACU in stable  condition.  There were no complications.     The patient will be advanced to weight bear as tolerated on bilateral lower  extremities and followup in 7-10 days with the PA for suture removal.     I was the attending orthopedic surgeon for this procedure.  I was present for  the key and critical portions of the operation which included staged removal  of the anterior pelvic external fixator.                                                Andee Lineman, M.D.  MA/bjh  D:  05/02/2016 08:20  T:  05/02/2016 15:43  Job #:  [id number]           OPERATIVE REPORT                                             PAGE    1 of   1

## 2016-05-02 NOTE — Unmapped (Signed)
INTRA-OP POST BRIEFING NOTE: HENCE DERRICK      Specimens: No specimens    Prior to leaving the room: Nurse confirmed name of procedure, completion of instrument, sponge & needle counts, reads specimen labels aloud including patient name and addresses any equipment issues? Nurse confirmed wound class. Nurse to surgeon and anesthesia: What are key concerns for recovery and management of the patient?  Yes      Blood products stored at appropriate temperatures prior to return to blood bank (if applicable)? N/A      Patient identification band secured on patient prior to transfer out of the operating room? Yes      Other Comments:     Signed: Rogue Jury    Date: 05/02/2016    Time: 7:40 AM

## 2016-05-02 NOTE — Unmapped (Signed)
Anesthesia Transfer of Care Note    Patient: Colin [name]  Procedure(s) Performed: Procedure(s):  STAGED REMOVAL PELVIC EXTERNAL FIXATOR    Patient location: PACU    Anesthesia type: general LMA    Airway Device on Arrival to PACU/ICU: Nasal Cannula    IV Access: Peripheral    Monitors Recommended to be Used During PACU/ICU: Standard Monitors    Outstanding Issues to Address: None    Level of Consciousness: awake and alert     Post vital signs:    Filed Vitals:    05/02/16 0759   BP: 106/80   Pulse: 91   Temp: 97.2 ??F (36.2 ??C)   Resp: 12   SpO2: 100%       Complications: None      Date 05/01/16 0700 - 05/02/16 0659(Not Admitted) 05/02/16 0700 - 05/03/16 0659   Shift 0700-1459 1500-2259 2300-0659 24 Hour Total 0700-1459 1500-2259 2300-0659 24 Hour Total   I  N  T  A  K  E   I.V.     800  (10.1)   800  (10.1)      Volume (mL) (lactated Ringers infusion)     800   800    Shift Total  (mL/kg)     800  (10.1)   800  (10.1)   O  U  T  P  U  T   Shift Total  (mL/kg)           Weight (kg)   79.4 79.4 79.4 79.4 79.4 79.4

## 2016-05-02 NOTE — Unmapped (Signed)
Anesthesia Extubation Criteria:    Airway Device: laryngeal mask airway    Emergence Details:      Smooth      _x_      Stormy       __       Prolonged   __     Extubation Criteria:      Motor strength intact       _x_      Follows commands        _x_      Good airway reflexes      _x_      OP suctioned                  _x_        Follows commands:  Yes     Patient extubated:  Yes

## 2016-05-05 ENCOUNTER — Encounter: Admit: 2016-05-05 | Discharge: 2016-05-06 | Payer: PRIVATE HEALTH INSURANCE

## 2016-05-05 DIAGNOSIS — R102 Pelvic and perineal pain: Secondary | ICD-10-CM

## 2016-05-05 NOTE — Progress Notes (Signed)
PHYSICAL THERAPY PROGRESS NOTES -      Diagnosis:   1. Gait difficulty     2. Peripheral tear of lateral meniscus of left knee, unspecified whether old or current tear, subsequent encounter             Referring Provider:Archdeacon, Casimiro Needle, MD    Insurance plan:   Payor/Plan Subscr DOB Sex Relation Sub. Ins. ID Effective Group Num   1. GENERIC COMME* Colin [name],Colin [name] 05-17-1961 Male  [id number] 02/23/16 [id number]                                   [address], [place] [address]                         # of visits per insurance authorization:20  # of visits per POC: 16    Date of Initial Eval: 04/01/16    Goals of Therapy:      Assessment:    Assessment/Problem List: Patient presents with swollen/painful L knee (left lateral meniscus tear) and pelvic fracture with external fixation due to fall off ladder contributing to functional limitations including limited mobility, strength, and ROM . Patient would benefit from skilled PT to address the aforementioned deficits. Patient/family received education on the purpose of therapy, participated in the development of the POC and verbalized understanding and agreement of POC, goals.   Rehabilitation Potential: Based on this therapist'[name] assessment, Colin [name] has the following rehabilitation potential for the PT goals stated below: : good   Plan Of Care:    Patient/Family Goals: By discharge, pt would like to:   Short Term Goals: To be met by 4 week  initial status    patient will improve L knee flexibility / ROM to full ROM in order to ambulate pain free.  L knee ROM 38-115 degrees    patient will ambulate 500 feet on all surfaces using axillary crutches independently .  Unable to ambulate currently due to pain/swelling      longTerm Goals: To be met by 8 weeks  Initial status    patient will reduce pain to 0 /10 on 0-10 pain scale to allow pain free movement.  L knee pain 8-9/10 with movement    Decrease L knee swelling to normal range  L knee swelling (see above for measurements)        ______________________________________________________________________  Pain:8-9/10 Location: L knee                        Precautions: TTWB RLE (physician instructed)    Activity Date: 04/01/16 Date:  Date: Date:    Visit # 1/16      G Code # (if needed)         CPT code  Daily treatment record Timed Coded Treatment Minutes   PT Evaluation: 97001 Eval completed, POC established 94'   Neuromuscular Re-education: 97112 -QS, HS, GS, gentle heel slides, SLR  -elevation, ice to L knees 15'    Total Treatment TIme:  39'         Pain:   L knee pain flexed 2/10; extended 3/10                                      Visit #: 2 / 6-12  Exercise Log:   04/10/16    v2 05/05/16   v3     HS supine str R only H60x1 R/L  H90x2     HS seated str L only H15x2 R/L  H90x2     gastroc str Difficult d/t BLE / WB restrictions Completed during seated / supine HS w/ strap  AP'[name]     Marching Supine  X10;   Seated  x20 Supine  x15  Seated  x20     SLR'[name] R  X5;   L x4 w/ mod A R/L   x20     LAQ'[name] L max A d/t pain  x5 R/L w/ ball   x20     AP'[name] / circles cw/ccw x20  each R/L  x20ea     GS / QS H5x5 each Reminders to engage during all TE            Hip abd/add - R/L   x15     Prone hip ext - R/L  2x5       knee flex - R/L  x20                          Standing in II bars - Lateral  wt-shift R/L; marching then  2x10 BLE lifts / alternating     Gait in gym - -     Step negotiation - -     CP / L knee - declined     sci-fit - 1.0x62min       Subjective:  ...I'm feeling a lot better today - after they took out the bar in my hips and the cortisone shot I've got less pain....  Objective:  Treatment per flow sheet - he was able to demonstrate his originally provided TE / HEP - additional BLE / TE instructed on today - introduced standing / WBing in II bars - he is very hesitant about WBing thru LLE (understandably though) able to practice wt-shifting / laterally moving onto standing marching WBAT BLE'[name].  He declined CP to L knee today citing he would  do at home.  New handout provided for extended BLE / therapeutic interventions.  Assessment:  Much improved tolerance since his last visit on 04/10/16.  Has had pelvic external fixator removed and cortisone injection provided for L knee pain - less report of pain noted today.  Plan:  Continue with POC - F/U with Dr Cloyde Reams end of September - MED-BEND /( MEDICAID product?)       MINUTES of TREATMENT   Evaluation/ Re evaluation    Therapeutic Exercise 56   Therapeutic Activity    Gait Training    Manual Therapy    Iontophoresis    Ultrasound    Electrical Stimulation    Neuromuscular Rehab (balance training)    Ice/ Heat    Total Treatment Time 56

## 2016-05-07 ENCOUNTER — Encounter

## 2016-05-12 ENCOUNTER — Encounter: Admit: 2016-05-12 | Discharge: 2016-05-12 | Payer: PRIVATE HEALTH INSURANCE

## 2016-05-12 DIAGNOSIS — R269 Unspecified abnormalities of gait and mobility: Secondary | ICD-10-CM

## 2016-05-12 NOTE — Progress Notes (Signed)
PHYSICAL THERAPY PROGRESS NOTES -      Diagnosis:   1. Gait difficulty     2. Peripheral tear of lateral meniscus of left knee, unspecified whether old or current tear, subsequent encounter             Referring Provider:Archdeacon, Casimiro Needle, MD    Insurance plan:   Payor/Plan Subscr DOB Sex Relation Sub. Ins. ID Effective Group Num   1. GENERIC COMME* Colin [name],Colin [name] 03-07-1961 Male  [id number] 02/23/16 10378                                   [address], [place] [address]                         # of visits per insurance authorization:20  # of visits per POC: 16    Date of Initial Eval: 04/01/16    Goals of Therapy:      Assessment:    Assessment/Problem List: Patient presents with swollen/painful L knee (left lateral meniscus tear) and pelvic fracture with external fixation due to fall off ladder contributing to functional limitations including limited mobility, strength, and ROM . Patient would benefit from skilled PT to address the aforementioned deficits. Patient/family received education on the purpose of therapy, participated in the development of the POC and verbalized understanding and agreement of POC, goals.   Rehabilitation Potential: Based on this therapist'[name] assessment, Colin [name] has the following rehabilitation potential for the PT goals stated below: : good   Plan Of Care:    Patient/Family Goals: By discharge, pt would like to:   Short Term Goals: To be met by 4 week  initial status    patient will improve L knee flexibility / ROM to full ROM in order to ambulate pain free.  L knee ROM 38-115 degrees    patient will ambulate 500 feet on all surfaces using axillary crutches independently .  Unable to ambulate currently due to pain/swelling      longTerm Goals: To be met by 8 weeks  Initial status    patient will reduce pain to 0 /10 on 0-10 pain scale to allow pain free movement.  L knee pain 8-9/10 with movement    Decrease L knee swelling to normal range  L knee swelling (see above for measurements)        ______________________________________________________________________  Pain:8-9/10 Location: L knee                        Precautions: TTWB RLE (physician instructed)    Activity Date: 04/01/16 Date:  Date: Date:    Visit # 1/16      G Code # (if needed)         CPT code  Daily treatment record Timed Coded Treatment Minutes   PT Evaluation: 97001 Eval completed, POC established 76'   Neuromuscular Re-education: 97112 -QS, HS, GS, gentle heel slides, SLR  -elevation, ice to L knees 15'    Total Treatment TIme:  15'         Pain:   L knee pain flexed 2/10; extended  2-3/10                                      Visit #: 3 / 6-12  Exercise Log:   04/10/16    v2 05/05/16   v3 05/12/16  v4    HS supine str R only H60x1 R/L  H90x2     HS seated str L only H15x2 R/L  H90x2     gastroc str Difficult d/t BLE / WB restrictions Completed during seated / supine HS w/ strap  AP'[name]     Marching Supine  X10;   Seated  x20 Supine  x15  Seated  x20     SLR'[name] R  X5;   L x4 w/ mod A R/L   x20     LAQ'[name] L max A d/t pain  x5 R/L w/ ball   x20     AP'[name] / circles cw/ccw x20  each R/L  x20ea     GS / QS H5x5 each Reminders to engage during all TE            Hip abd/add - R/L   x15     Prone hip ext - R/L  2x5 R/L   2x10      knee flex - R/L  x20 R/L  x20           bridging - - x20    unilat bridge - - R/L  x15                  Heel raises - - 2x10    Standing 3-way hips in II bars - - R/L  F / E / ABD  x15 ea    Standing in II bars - Lateral  wt-shift R/L; marching then  2x10 BLE lifts / alternating Marching in II bars  4 x10 - single HH    Gait in gym - - W/ RW  3x50'  CGA    Step negotiation - - Up/down 4x6 steps  Step-to pattern x2 w/ CGA    CP / L knee - declined declined    sci-fit - 1.0x5min 2.0x39min      Subjective:  .[name].I was sore after I worked out last week - took a couple Tylenol this morning too....  Objective:  Treatment per flow sheet - he began with sci-fit / light cardio warm-up then he demonstrated standing /  closed-chain kinetics / WBing in II bars today - marching, B hip TE, heel raises - moved him onto mat therex then initiated ambulating with RW in gym.  Introduced step negotiation in gym at [employer] he did step-to pattern using bilateral handrailings.  Declined CP at EOS.  Assessment:  GOOD tolerance to today'[name] closed-chain challenges - he seems very hesitant at times to WB thru LLE - did well with ambulation skills and initial step negotiation.  Plan:  Continue with POC - expanding closed-chain kinetic TE - F/U with Dr Cloyde Reams end of September - MED-BEND /( MEDICAID product?)       MINUTES of TREATMENT   Evaluation/ Re evaluation    Therapeutic Exercise 57   Therapeutic Activity    Gait Training    Manual Therapy    Iontophoresis    Ultrasound    Electrical Stimulation    Neuromuscular Rehab (balance training)    Ice/ Heat    Total Treatment Time 57

## 2016-05-14 ENCOUNTER — Encounter: Admit: 2016-05-14 | Discharge: 2016-05-14 | Payer: PRIVATE HEALTH INSURANCE

## 2016-05-14 DIAGNOSIS — R269 Unspecified abnormalities of gait and mobility: Secondary | ICD-10-CM

## 2016-05-14 NOTE — Progress Notes (Signed)
PHYSICAL THERAPY PROGRESS NOTES -      Diagnosis:   1. Gait difficulty     2. Peripheral tear of lateral meniscus of left knee, unspecified whether old or current tear, subsequent encounter             Referring Provider:Archdeacon, Casimiro Needle, MD    Insurance plan:   Payor/Plan Subscr DOB Sex Relation Sub. Ins. ID Effective Group Num   1. GENERIC COMME* Dirosa,Bowen S 1961-02-28 Male  [id number] 02/23/16 10378                                   [address], [place] [address]                         # of visits per insurance authorization:20  # of visits per POC: 16    Date of Initial Eval: 04/01/16    Goals of Therapy:      Assessment:    Assessment/Problem List: Patient presents with swollen/painful L knee (left lateral meniscus tear) and pelvic fracture with external fixation due to fall off ladder contributing to functional limitations including limited mobility, strength, and ROM . Patient would benefit from skilled PT to address the aforementioned deficits. Patient/family received education on the purpose of therapy, participated in the development of the POC and verbalized understanding and agreement of POC, goals.   Rehabilitation Potential: Based on this therapist's assessment, Colin LOTSPEICH has the following rehabilitation potential for the PT goals stated below: : good   Plan Of Care:    Patient/Family Goals: By discharge, pt would like to:   Short Term Goals: To be met by 4 week  initial status    patient will improve L knee flexibility / ROM to full ROM in order to ambulate pain free.  L knee ROM 38-115 degrees    patient will ambulate 500 feet on all surfaces using axillary crutches independently .  Unable to ambulate currently due to pain/swelling      longTerm Goals: To be met by 8 weeks  Initial status    patient will reduce pain to 0 /10 on 0-10 pain scale to allow pain free movement.  L knee pain 8-9/10 with movement    Decrease L knee swelling to normal range  L knee swelling (see above for measurements)        ______________________________________________________________________  Pain:8-9/10 Location: L knee                        Precautions: TTWB RLE (physician instructed)    Activity Date: 04/01/16 Date:  Date: Date:    Visit # 1/16      G Code # (if needed)         CPT code  Daily treatment record Timed Coded Treatment Minutes   PT Evaluation: 97001 Eval completed, POC established 43'   Neuromuscular Re-education: 97112 -QS, HS, GS, gentle heel slides, SLR  -elevation, ice to L knees 15'    Total Treatment TIme:  72'         Pain:   L knee pain flexed 2/10; extended  2-3/10                                      Visit #: 3 / 6-12  Exercise Log:   04/10/16    v2 05/05/16   v3 05/12/16  v4    HS supine str R only H60x1 R/L  H90x2     HS seated str L only H15x2 R/L  H90x2     gastroc str Difficult d/t BLE / WB restrictions Completed during seated / supine HS w/ strap  AP's     Marching Supine  X10;   Seated  x20 Supine  x15  Seated  x20     SLR's R  X5;   L x4 w/ mod A R/L   x20     LAQ's L max A d/t pain  x5 R/L w/ ball   x20     AP's / circles cw/ccw x20  each R/L  x20ea     GS / QS H5x5 each Reminders to engage during all TE            Hip abd/add - R/L   x15     Prone hip ext - R/L  2x5 R/L   2x10      knee flex - R/L  x20 R/L  x20           bridging - - x20    unilat bridge - - R/L  x15                  Heel raises - - 2x10    Standing 3-way hips in II bars - - R/L  F / E / ABD  x15 ea    Standing in II bars - Lateral  wt-shift R/L; marching then  2x10 BLE lifts / alternating Marching in II bars  4 x10 - single HH    Gait in gym - - W/ RW  3x50'  CGA    Step negotiation - - Up/down 4x6 steps  Step-to pattern x2 w/ CGA    CP / L knee - declined declined    sci-fit - 1.0x12min 2.0x55min      Subjective:  .Colin [name].I was sore after I worked out last week - took a couple Tylenol this morning too....  Objective:  Treatment per flow sheet - he began with sci-fit / light cardio warm-up then he demonstrated standing /  closed-chain kinetics / WBing in II bars today - marching, B hip TE, heel raises - moved him onto mat therex then initiated ambulating with RW in gym.  Introduced step negotiation in gym at [employer] he did step-to pattern using bilateral handrailings.  Declined CP at EOS.  Assessment:  GOOD tolerance to today's closed-chain challenges - he seems very hesitant at times to WB thru LLE - did well with ambulation skills and initial step negotiation.  Plan:  Continue with POC - expanding closed-chain kinetic TE - F/U with Dr Cloyde Reams end of September - MED-BEND /( MEDICAID product?)       MINUTES of TREATMENT   Evaluation/ Re evaluation    Therapeutic Exercise 57   Therapeutic Activity    Gait Training    Manual Therapy    Iontophoresis    Ultrasound    Electrical Stimulation    Neuromuscular Rehab (balance training)    Ice/ Heat    Total Treatment Time 57       PHYSICAL THERAPY PROGRESS NOTES -      Diagnosis:   1. Gait difficulty     2. Peripheral tear of lateral meniscus of left knee, unspecified whether old or current tear, subsequent encounter  Referring Provider:Archdeacon, Casimiro Needle, MD    Insurance plan:   Payor/Plan Subscr DOB Sex Relation Sub. Ins. ID Effective Group Num   1. GENERIC COMME* Colin,[name] S 1961/01/19 Male  [id number] 02/23/16 10378                                   [address], [place] [address]                         # of visits per insurance authorization:20  # of visits per POC: 16    Date of Initial Eval: 04/01/16    Goals of Therapy:      Assessment:    Assessment/Problem List: Patient presents with swollen/painful L knee (left lateral meniscus tear) and pelvic fracture with external fixation due to fall off ladder contributing to functional limitations including limited mobility, strength, and ROM . Patient would benefit from skilled PT to address the aforementioned deficits. Patient/family received education on the purpose of therapy, participated in the development of the POC and  verbalized understanding and agreement of POC, goals.   Rehabilitation Potential: Based on this therapist's assessment, AAKARSH CUTTER has the following rehabilitation potential for the PT goals stated below: : good   Plan Of Care:    Patient/Family Goals: By discharge, pt would like to:   Short Term Goals: To be met by 4 week  initial status    patient will improve L knee flexibility / ROM to full ROM in order to ambulate pain free.  L knee ROM 38-115 degrees    patient will ambulate 500 feet on all surfaces using axillary crutches independently .  Unable to ambulate currently due to pain/swelling      longTerm Goals: To be met by 8 weeks  Initial status    patient will reduce pain to 0 /10 on 0-10 pain scale to allow pain free movement.  L knee pain 8-9/10 with movement    Decrease L knee swelling to normal range  L knee swelling (see above for measurements)       ______________________________________________________________________  Pain:8-9/10 Location: L knee                        Precautions: TTWB RLE (physician instructed)    Activity Date: 04/01/16 Date:  Date: Date:    Visit # 1/16      G Code # (if needed)         CPT code  Daily treatment record Timed Coded Treatment Minutes   PT Evaluation: 97001 Eval completed, POC established 54'   Neuromuscular Re-education: 97112 -QS, HS, GS, gentle heel slides, SLR  -elevation, ice to L knees 15'    Total Treatment TIme:  22'         Pain:   L knee pain flexed 2/10; extended  2/10                                      Visit #: 5 / 6-12    Exercise Log:   04/10/16    v2 05/05/16   v3 05/12/16  v4 05/14/16  v5    HS supine str R only H60x1 R/L  H90x2      HS seated str L only H15x2 R/L  H90x2  gastroc str Difficult d/t BLE / WB restrictions Completed during seated / supine HS w/ strap  AP's      Marching Supine  X10;   Seated  x20 Supine  x15  Seated  x20      SLR's R  X5;   L x4 w/ mod A R/L   x20      LAQ's L max A d/t pain  x5 R/L w/ ball   x20      AP's / circles  cw/ccw x20  each R/L  x20ea      GS / QS H5x5 each Reminders to engage during all TE              Hip abd/add - R/L   x15      Prone hip ext - R/L  2x5 R/L   2x10 R/L  x25      knee flex - R/L  x20 R/L  x20 R/L  x20            bridging - - x20 W/ ball at knees   2x15    unilat bridge - - R/L  x15 R/L x20 painful RL    hooklying marching - - - 2x15            Lunges on BOSU - - - R/L  x12    Lateral stepping on BOSU - - - R  X15;     L  x10    ASU on BOSU - - - -            Heel raises - - 2x10     Standing 3-way hips in II bars - - R/L  F / E / ABD  x15 ea  F / ABD  R/L  x20    Standing in II bars - Lateral  wt-shift R/L; marching then  2x10 BLE lifts / alternating Marching in II bars  4 x10 - single HH -    Gait in gym - - W/ RW  3x50'  CGA W/ RW  3x50'   SBA/A    Step negotiation - - Up/down 4x6 steps  Step-to pattern x2 w/ CGA x4 using B HR's  Reciprocal up; step to down  W/ SBA/S    CP / L knee - declined declined dcelined    sci-fit - 1.0x13min 2.0x69min 3.5x45min      Subjective:  .Colin [name].I was sore after I worked out last week - took a couple Tylenol this morning too....  Objective:  Treatment per flow sheet - sci-fit / moderate cardio warm-up - he demo'd closed-chain kinetics / WBing in II bars  - introduced work on [employer] / round side up - changed up with mat TE - and ambulating in gym with RW - Completed step negotiation in gym - reciprocally ascending and step-to pattern going down using bilateral handrailings.  Declined CP at EOS.  Assessment:  Improving WBing tolerance to all closed-chain therex - descending steps most difficult d/t L knee pain / weakness - needing less assist during standing activities (becoming more confident with his FWBing status)  Plan:  Continue with POC - expanding closed-chain kinetic TE - F/U with Dr Cloyde Reams end of May 19, 2016       MINUTES of TREATMENT   Evaluation/ Re evaluation    Therapeutic Exercise 56   Therapeutic Activity    Gait Training    Manual Therapy     Iontophoresis    Ultrasound  Electrical Stimulation    Neuromuscular Rehab (balance training)    Ice/ Heat    Total Treatment Time 56

## 2016-05-19 ENCOUNTER — Encounter: Admit: 2016-05-19 | Payer: PRIVATE HEALTH INSURANCE

## 2016-05-19 ENCOUNTER — Ambulatory Visit: Admit: 2016-05-19 | Discharge: 2016-05-19 | Payer: Legal Liability / Liability Insurance

## 2016-05-19 DIAGNOSIS — S3282XD Multiple fractures of pelvis without disruption of pelvic ring, subsequent encounter for fracture with routine healing: Secondary | ICD-10-CM

## 2016-05-19 DIAGNOSIS — R269 Unspecified abnormalities of gait and mobility: Secondary | ICD-10-CM

## 2016-05-19 NOTE — Progress Notes (Signed)
This office note has been dictated.

## 2016-05-19 NOTE — Progress Notes (Addendum)
Chief Complaint:   Chief Complaint   Patient presents with    Post-op Evaluation     ex fix removal 9/8     Subjective:   Colin [name] 55 y.o.male returns 2 wks s/p removal ExFix pelvis 05/02/16.  DOI:  03/03/16  His weightbearing was advanced following removal.  He has been tolerating WB w/ walker.  Currently doing therapy at [facility].    Objective:  Vitals:    05/19/16 0902   Weight: 170 lb (77.1 kg)   Height: 5' 8 (1.727 m)       Physical Exam:  Well-appearing.    Ambulating slowly w/ walker.  Pin sites healing well.  5/5 KF/KE/ADF/APF  SILT LFCN/dp/sp/t BLE    Assessment:  1. Multiple closed fractures of pelvis without disruption of pelvic ring with routine healing, subsequent encounter        Plan:  Continue progression of WB & ambulation.  Tylenol / NSAIDs prn  RTC 6 wks   X-rays next visit:  Pelvis AP/inlet/outlet    Edison Simon, PA-C

## 2016-05-19 NOTE — Unmapped (Signed)
PHYSICAL THERAPY PROGRESS NOTES -      Diagnosis:   1. Gait difficulty     2. Peripheral tear of lateral meniscus of left knee, unspecified whether old or current tear, subsequent encounter             Referring Provider:Archdeacon, Casimiro Needle, MD    Insurance plan:   Payor/Plan Subscr DOB Sex Relation Sub. Ins. ID Effective Group Num   1. GENERIC COMME* Colin [name],Colin [name] [birth year] Male  [id number] 02/23/16 [id number]                                   [address], [place] [address]                         # of visits per insurance authorization:20  # of visits per POC: 16    Date of Initial Eval: 04/01/16    Goals of Therapy:      Assessment:    Assessment/Problem List: Patient presents with swollen/painful L knee (left lateral meniscus tear) and pelvic fracture with external fixation due to fall off ladder contributing to functional limitations including limited mobility, strength, and ROM . Patient would benefit from skilled PT to address the aforementioned deficits. Patient/family received education on the purpose of therapy, participated in the development of the POC and verbalized understanding and agreement of POC, goals.   Rehabilitation Potential: Based on this therapist'[name] assessment, Colin [name] has the following rehabilitation potential for the PT goals stated below: : good   Plan Of Care:    Patient/Family Goals: By discharge, pt would like to:   Short Term Goals: To be met by 4 week  initial status    patient will improve L knee flexibility / ROM to full ROM in order to ambulate pain free.  L knee ROM 38-115 degrees    patient will ambulate 500 feet on all surfaces using axillary crutches independently .  Unable to ambulate currently due to pain/swelling      longTerm Goals: To be met by 8 weeks  Initial status    patient will reduce pain to 0 /10 on 0-10 pain scale to allow pain free movement.  L knee pain 8-9/10 with movement    Decrease L knee swelling to normal range  L knee swelling (see above for measurements)        ______________________________________________________________________  Pain:8-9/10 Location: L knee                        Precautions: TTWB RLE (physician instructed)    Activity Date: 04/01/16 Date:  Date: Date:    Visit # 1/16      G Code # (if needed)         CPT code  Daily treatment record Timed Coded Treatment Minutes   PT Evaluation: 97001 Eval completed, POC established 64'   Neuromuscular Re-education: 97112 -QS, HS, GS, gentle heel slides, SLR  -elevation, ice to L knees 15'    Total Treatment TIme:  44'         Pain:   L knee pain flexed 2/10; extended  2-3/10                                      Visit #: 3 / 6-12  Exercise Log:   04/10/16    v2 05/05/16   v3 05/12/16  v4    HS supine str R only H60x1 R/L  H90x2     HS seated str L only H15x2 R/L  H90x2     gastroc str Difficult d/t BLE / WB restrictions Completed during seated / supine HS w/ strap  AP'[name]     Marching Supine  X10;   Seated  x20 Supine  x15  Seated  x20     SLR'[name] R  X5;   L x4 w/ mod A R/L   x20     LAQ'[name] L max A d/t pain  x5 R/L w/ ball   x20     AP'[name] / circles cw/ccw x20  each R/L  x20ea     GS / QS H5x5 each Reminders to engage during all TE            Hip abd/add - R/L   x15     Prone hip ext - R/L  2x5 R/L   2x10      knee flex - R/L  x20 R/L  x20           bridging - - x20    unilat bridge - - R/L  x15                  Heel raises - - 2x10    Standing 3-way hips in II bars - - R/L  F / E / ABD  x15 ea    Standing in II bars - Lateral  wt-shift R/L; marching then  2x10 BLE lifts / alternating Marching in II bars  4 x10 - single HH    Gait in gym - - W/ RW  3x50'  CGA    Step negotiation - - Up/down 4x6 steps  Step-to pattern x2 w/ CGA    CP / L knee - declined declined    sci-fit - 1.0x36min 2.0x56min      Subjective:  .[name].I was sore after I worked out last week - took a couple Tylenol this morning too....  Objective:  Treatment per flow sheet - he began with sci-fit / light cardio warm-up then he demonstrated standing /  closed-chain kinetics / WBing in II bars today - marching, B hip TE, heel raises - moved him onto mat therex then initiated ambulating with RW in gym.  Introduced step negotiation in gym at [employer] he did step-to pattern using bilateral handrailings.  Declined CP at EOS.  Assessment:  GOOD tolerance to today'[name] closed-chain challenges - he seems very hesitant at times to WB thru LLE - did well with ambulation skills and initial step negotiation.  Plan:  Continue with POC - expanding closed-chain kinetic TE - F/U with Dr Cloyde Reams end of September - MED-BEND /( MEDICAID product?)       MINUTES of TREATMENT   Evaluation/ Re evaluation    Therapeutic Exercise 57   Therapeutic Activity    Gait Training    Manual Therapy    Iontophoresis    Ultrasound    Electrical Stimulation    Neuromuscular Rehab (balance training)    Ice/ Heat    Total Treatment Time 57       PHYSICAL THERAPY PROGRESS NOTES -      Diagnosis:   1. Gait difficulty     2. Peripheral tear of lateral meniscus of left knee, unspecified whether old or current tear, subsequent encounter  Referring Provider:Archdeacon, Casimiro Needle, MD    Insurance plan:   Payor/Plan Subscr DOB Sex Relation Sub. Ins. ID Effective Group Num   1. GENERIC COMME* Colin [name],Colin [name] Oct 20, 1960 Male  [id number] 02/23/16 [id number]                                   [address], [place] [address]                         # of visits per insurance authorization:20  # of visits per POC: 16    Date of Initial Eval: 04/01/16    Goals of Therapy:      Assessment:    Assessment/Problem List: Patient presents with swollen/painful L knee (left lateral meniscus tear) and pelvic fracture with external fixation due to fall off ladder contributing to functional limitations including limited mobility, strength, and ROM . Patient would benefit from skilled PT to address the aforementioned deficits. Patient/family received education on the purpose of therapy, participated in the development of the POC and  verbalized understanding and agreement of POC, goals.   Rehabilitation Potential: Based on this therapist'[name] assessment, TYRAIL GRANDFIELD has the following rehabilitation potential for the PT goals stated below: : good   Plan Of Care:    Patient/Family Goals: By discharge, pt would like to:   Short Term Goals: To be met by 4 week  initial status    patient will improve L knee flexibility / ROM to full ROM in order to ambulate pain free.  L knee ROM 38-115 degrees    patient will ambulate 500 feet on all surfaces using axillary crutches independently .  Unable to ambulate currently due to pain/swelling      longTerm Goals: To be met by 8 weeks  Initial status    patient will reduce pain to 0 /10 on 0-10 pain scale to allow pain free movement.  L knee pain 8-9/10 with movement    Decrease L knee swelling to normal range  L knee swelling (see above for measurements)       ______________________________________________________________________  Pain:8-9/10 Location: L knee                        Precautions: TTWB RLE (physician instructed)    Activity Date: 04/01/16 Date:  Date: Date:    Visit # 1/16      G Code # (if needed)         CPT code  Daily treatment record Timed Coded Treatment Minutes   PT Evaluation: 97001 Eval completed, POC established 69'   Neuromuscular Re-education: 97112 -QS, HS, GS, gentle heel slides, SLR  -elevation, ice to L knees 15'    Total Treatment TIme:  55'         Pain:   L knee pain flexed  3-6 /10                                    Visit #: 6 / 6-12    Exercise Log:   04/10/16    v2 05/05/16   v3 05/12/16  v4 05/14/16  v5 05/19/16  v6   HS supine str R only H60x1 R/L  H90x2   W/strap  H75x2   HS seated str L only H15x2 R/L  H90x2  W/strap  H75x2   gastroc str Difficult d/t BLE / WB restrictions Completed during seated / supine HS w/ strap  AP'[name]   H60x1   Marching Supine  X10;   Seated  x20 Supine  x15  Seated  x20      SLR'[name] R  X5;   L x4 w/ mod A R/L   x20      LAQ'[name] L max A d/t pain  x5 R/L w/  ball   x20      AP'[name] / circles cw/ccw x20  each R/L  x20ea      GS / QS H5x5 each Reminders to engage during all TE              Hip abd/add - R/L   x15      Prone hip ext - R/L  2x5 R/L   2x10 R/L  x25 R/L x20     knee flex - R/L  x20 R/L  x20 R/L  x20 x25           bridging - - x20 W/ ball at knees   2x15    unilat bridge - - R/L  x15 R/L x20 painful RL    hooklying marching - - - 2x15            Lunges on BOSU - - - R/L  x12 R/L  x15   Lateral stepping on BOSU - - - R  X15;     L  x10 R x15;   L x5 painful   ASU on BOSU - - - -            Heel raises - - 2x10  2x15   Standing 3-way hips in II bars - - R/L  F / E / ABD  x15 ea  F / ABD  R/L  x20 F / E /ABD  R/L  x20   Standing in II bars - Lateral  wt-shift R/L; marching then  2x10 BLE lifts / alternating Marching in II bars  4 x10 - single HH -    Gait in gym - - W/ RW  3x50'  CGA W/ RW  3x50'   SBA/A Adjusted his RW to proper height   Step negotiation - - Up/down 4x6 steps  Step-to pattern x2 w/ CGA x4 using B HR'[name]  Reciprocal up; step to down  W/ SBA/[name] 4x6 steps  x4 using only 1 - HR / reciprocal   CP / L knee - declined declined declined declined   sci-fit - 1.0x32min 2.0x19min 3.5x80min XRIDE  L-8x7min     Subjective:  ...I overdid it this weekend - week-eating my yard - went to [facility] yesterday and walked around - Dr Cloyde Reams said it looked good....  Objective:  Treatment per flow sheet - ambulating into gym with his RW (adjusted to proper height) - XRIDE warm-up, followed by BLE flexibility - moved to closed-chain kinetics on BOSU ball in II bars (for handhold) and negotiating steps in gym w/ single hand-railing - reciprocal pattern - difficult descending d/t L knee discomfort.  During  BLE / TE in II bars he needed cuing to remain in good posture / upright position.  Declined CP at EOS.  Assessment:  GOOD tolerance to all aspects of his therapy rehab today - he c/o BLE soreness initially after his busy weekend but did well w/ all  challenges.  Plan:  Continue with POC - expanding closed-chain kinetic TE -  F/U with Dr Cloyde Reams in 6 weeks       MINUTES of TREATMENT   Evaluation/ Re evaluation    Therapeutic Exercise 54   Therapeutic Activity    Gait Training    Manual Therapy    Iontophoresis    Ultrasound    Electrical Stimulation    Neuromuscular Rehab (balance training)    Ice/ Heat    Total Treatment Time 54

## 2016-05-21 ENCOUNTER — Encounter: Admit: 2016-05-21 | Discharge: 2016-05-21 | Payer: PRIVATE HEALTH INSURANCE

## 2016-05-21 DIAGNOSIS — R269 Unspecified abnormalities of gait and mobility: Secondary | ICD-10-CM

## 2016-05-21 NOTE — Progress Notes (Signed)
PHYSICAL THERAPY PROGRESS NOTES -      Diagnosis:   1. Gait difficulty     2. Peripheral tear of lateral meniscus of left knee, unspecified whether old or current tear, subsequent encounter             Referring Provider:Archdeacon, Casimiro Needle, MD    Insurance plan:   Payor/Plan Subscr DOB Sex Relation Sub. Ins. ID Effective Group Num   1. GENERIC COMME* Colin Colin [name],Colin Colin [name] 1961/01/11 Male  [id number] 02/23/16 [id number]                                   [address], [place] [address]                         # of visits per insurance authorization:20  # of visits per POC: 16    Date of Initial Eval: 04/01/16    Goals of Therapy:      Assessment:    Assessment/Problem List: Patient presents with swollen/painful L knee (left lateral meniscus tear) and pelvic fracture with external fixation due to fall off ladder contributing to functional limitations including limited mobility, strength, and ROM . Patient would benefit from skilled PT to address the aforementioned deficits. Patient/family received education on the purpose of therapy, participated in the development of the POC and verbalized understanding and agreement of POC, goals.   Rehabilitation Potential: Based on this therapist'Colin [name] assessment, Colin Colin [name] has the following rehabilitation potential for the PT goals stated below: : good   Plan Of Care:    Patient/Family Goals: By discharge, pt would like to:   Short Term Goals: To be met by 4 week  initial status    patient will improve L knee flexibility / ROM to full ROM in order to ambulate pain free.  L knee ROM 38-115 degrees    patient will ambulate 500 feet on all surfaces using axillary crutches independently .  Unable to ambulate currently due to pain/swelling      longTerm Goals: To be met by 8 weeks  Initial status    patient will reduce pain to 0 /10 on 0-10 pain scale to allow pain free movement.  L knee pain 8-9/10 with movement    Decrease L knee swelling to normal range  L knee swelling (see above for measurements)        ______________________________________________________________________  Pain:8-9/10 Location: L knee                        Precautions: TTWB RLE (physician instructed)    Activity Date: 04/01/16 Date:  Date: Date:    Visit # 1/16      G Code # (if needed)         CPT code  Daily treatment record Timed Coded Treatment Minutes   PT Evaluation: 97001 Eval completed, POC established 60'   Neuromuscular Re-education: 97112 -QS, HS, GS, gentle heel slides, SLR  -elevation, ice to L knees 15'    Total Treatment TIme:  75'         Pain:   L knee pain flexed 2/10; extended  2-3/10                                      Visit #: 3 / 6-12  Exercise Log:   04/10/16    v2 05/05/16   v3 05/12/16  v4    HS supine str R only H60x1 R/L  H90x2     HS seated str L only H15x2 R/L  H90x2     gastroc str Difficult d/t BLE / WB restrictions Completed during seated / supine HS w/ strap  AP'Colin [name]     Marching Supine  X10;   Seated  x20 Supine  x15  Seated  x20     SLR'Colin [name] R  X5;   L x4 w/ mod A R/L   x20     LAQ'Colin [name] L max A d/t pain  x5 R/L w/ ball   x20     AP'Colin [name] / circles cw/ccw x20  each R/L  x20ea     GS / QS H5x5 each Reminders to engage during all TE            Hip abd/add - R/L   x15     Prone hip ext - R/L  2x5 R/L   2x10      knee flex - R/L  x20 R/L  x20           bridging - - x20    unilat bridge - - R/L  x15                  Heel raises - - 2x10    Standing 3-way hips in II bars - - R/L  F / E / ABD  x15 ea    Standing in II bars - Lateral  wt-shift R/L; marching then  2x10 BLE lifts / alternating Marching in II bars  4 x10 - single HH    Gait in gym - - W/ RW  3x50'  CGA    Step negotiation - - Up/down 4x6 steps  Step-to pattern x2 w/ CGA    CP / L knee - declined declined    sci-fit - 1.0x69min 2.0x53min      Subjective:  .[name].I was sore after I worked out last week - took a couple Tylenol this morning too....  Objective:  Treatment per flow sheet - he began with sci-fit / light cardio warm-up then he demonstrated standing /  closed-chain kinetics / WBing in II bars today - marching, B hip TE, heel raises - moved him onto mat therex then initiated ambulating with RW in gym.  Introduced step negotiation in gym at [employer] he did step-to pattern using bilateral handrailings.  Declined CP at EOS.  Assessment:  GOOD tolerance to today'Colin [name] closed-chain challenges - he seems very hesitant at times to WB thru LLE - did well with ambulation skills and initial step negotiation.  Plan:  Continue with POC - expanding closed-chain kinetic TE - F/U with Dr Cloyde Reams end of September - MED-BEND /( MEDICAID product?)       MINUTES of TREATMENT   Evaluation/ Re evaluation    Therapeutic Exercise 57   Therapeutic Activity    Gait Training    Manual Therapy    Iontophoresis    Ultrasound    Electrical Stimulation    Neuromuscular Rehab (balance training)    Ice/ Heat    Total Treatment Time 57       PHYSICAL THERAPY PROGRESS NOTES -      Diagnosis:   1. Gait difficulty     2. Peripheral tear of lateral meniscus of left knee, unspecified whether old or current tear, subsequent encounter  Referring Provider:Archdeacon, Casimiro Needle, MD    Insurance plan:   Payor/Plan Subscr DOB Sex Relation Sub. Ins. ID Effective Group Num   1. GENERIC COMME* Colin Colin [name],Colin Colin [name] 03/19/1961 Male  [id number] 02/23/16 [id number]                                   [address], [place] [address]                         # of visits per insurance authorization:20  # of visits per POC: 16    Date of Initial Eval: 04/01/16    Goals of Therapy:      Assessment:    Assessment/Problem List: Patient presents with swollen/painful L knee (left lateral meniscus tear) and pelvic fracture with external fixation due to fall off ladder contributing to functional limitations including limited mobility, strength, and ROM . Patient would benefit from skilled PT to address the aforementioned deficits. Patient/family received education on the purpose of therapy, participated in the development of the POC and  verbalized understanding and agreement of POC, goals.   Rehabilitation Potential: Based on this therapist'Colin [name] assessment, DEAGAN CHICAS has the following rehabilitation potential for the PT goals stated below: : good   Plan Of Care:    Patient/Family Goals: By discharge, pt would like to:   Short Term Goals: To be met by 4 week  initial status    patient will improve L knee flexibility / ROM to full ROM in order to ambulate pain free.  L knee ROM 38-115 degrees    patient will ambulate 500 feet on all surfaces using axillary crutches independently .  Unable to ambulate currently due to pain/swelling      longTerm Goals: To be met by 8 weeks  Initial status    patient will reduce pain to 0 /10 on 0-10 pain scale to allow pain free movement.  L knee pain 8-9/10 with movement    Decrease L knee swelling to normal range  L knee swelling (see above for measurements)       ______________________________________________________________________  Pain:8-9/10 Location: L knee                        Precautions: TTWB RLE (physician instructed)    Activity Date: 04/01/16 Date:  Date: Date:    Visit # 1/16      G Code # (if needed)         CPT code  Daily treatment record Timed Coded Treatment Minutes   PT Evaluation: 97001 Eval completed, POC established 37'   Neuromuscular Re-education: 97112 -QS, HS, GS, gentle heel slides, SLR  -elevation, ice to L knees 15'    Total Treatment TIme:  57'         Pain:   L knee pain flexed  3 /10                                    Visit #: 7 / 6-12    Exercise Log:   04/10/16    v2 05/05/16   v3 05/12/16  v4 05/14/16  v5 05/19/16  v6 05/21/16   v7   HS supine str R only H60x1 R/L  H90x2   W/strap  H75x2 H90x2   HS seated str L  only H15x2 R/L  H90x2   W/strap  H75x2 H90x2   gastroc str Difficult d/t BLE / WB restrictions Completed during seated / supine HS w/ strap  AP'Colin [name]   H60x1 H45x2   Marching Supine  X10;   Seated  x20 Supine  x15  Seated  x20       SLR'Colin [name] R  X5;   L x4 w/ mod A R/L   x20    2#   R/L   x15   LAQ'Colin [name] L max A d/t pain  x5 R/L w/ ball   x20    2#  W/ ball  2x10   AP'Colin [name] / circles cw/ccw x20  each R/L  x20ea       GS / QS H5x5 each Reminders to engage during all TE       SAQ'Colin [name] - - - - - 2#  R/L  H5x20   Hip abd/add - R/L   x15       Prone hip ext - R/L  2x5 R/L   2x10 R/L  x25 R/L x20 2#  R/L   2x10     knee flex - R/L  x20 R/L  x20 R/L  x20 x25 2#  R/L  x15            bridging - - x20 W/ ball at knees   2x15  W/ ball  2x20   unilat bridge - - R/L  x15 R/L x20 painful RL     hooklying marching - - - 2x15              Lunges on BOSU - - - R/L  x12 R/L  x15    Lateral stepping on BOSU - - - R  X15;     L  x10 R x15;   L x5 painful    ASU on BOSU - - - -              Heel raises - - 2x10  2x15    Standing 3-way hips in II bars - - R/L  F / E / ABD  x15 ea  F / ABD  R/L  x20 F / E /ABD  R/L  x20 F / E / ABD  Added 2# wt  R/L  x12   Standing in II bars - Lateral  wt-shift R/L; marching then  2x10 BLE lifts / alternating Marching in II bars  4 x10 - single HH -  Standing knee flex   2# wt  R/L   2x10   Gait in gym - - W/ RW  3x50'  CGA W/ RW  3x50'   SBA/A Adjusted his RW to proper height Without AD in therapy gym 3x45'   Step negotiation - - Up/down 4x6 steps  Step-to pattern x2 w/ CGA x4 using B HR'Colin [name]  Reciprocal up; step to down  W/ SBA/Colin [name] 4x6 steps  x4 using only 1 - HR / reciprocal    CP / L knee - declined declined declined declined CP / B knees  x66min   sci-fit - 1.0x110min 2.0x81min 3.5x64min XRIDE  L-8x48min XRIDE  L-8  x37min     Subjective:  ...the side-stepping really bothered my L knee last session - I'm still taking extra-strength Tylenol 2-3x/day....  Objective:  Treatment per flow sheet - ambulated into PT gym w/ RW - d/w patient that he could change to [facility] but he doesn't have one.  Warm-up on X-RIDE then flexibility BLE/TE.  Added 2# weight to BLE / TE this session as well as short arc quads as he continues to exhibit bilateral decreased quad strength.  He did BLE / TE in II bars w/ added  weight to ankles. He agreed to CP to B knees at EOS for comfort.  Assessment:  He seems to be responding well to therapeutic interventions - he continues to report bilateral knee pain L>R - he does exhibit decreased quad strength bilaterally - he continues to follow with [facility].    Plan:  Continue with POC - expanding closed-chain kinetic TE - F/U with Dr Cloyde Reams in 6 weeks       MINUTES of TREATMENT   Evaluation/ Re evaluation    Therapeutic Exercise 54   Therapeutic Activity    Gait Training    Manual Therapy    Iontophoresis    Ultrasound    Electrical Stimulation    Neuromuscular Rehab (balance training)    Ice/ Heat 10   Total Treatment Time 64

## 2016-05-26 ENCOUNTER — Encounter: Admit: 2016-05-26 | Discharge: 2016-05-26 | Payer: PRIVATE HEALTH INSURANCE

## 2016-05-26 DIAGNOSIS — R102 Pelvic and perineal pain: Secondary | ICD-10-CM

## 2016-05-26 NOTE — Unmapped (Signed)
PHYSICAL THERAPY PROGRESS NOTES -      Diagnosis:   1. Gait difficulty     2. Peripheral tear of lateral meniscus of left knee, unspecified whether old or current tear, subsequent encounter             Referring Provider:Archdeacon, Casimiro Needle, MD    Insurance plan:   Payor/Plan Subscr DOB Sex Relation Sub. Ins. ID Effective Group Num   1. GENERIC COMME* Colin [name],Colin [name] May 18, 1961 Male  [id number] 02/23/16 [id number]                                   [address], [place] [address]                         # of visits per insurance authorization:20  # of visits per POC: 16    Date of Initial Eval: 04/01/16    Goals of Therapy:      Assessment:    Assessment/Problem List: Patient presents with swollen/painful L knee (left lateral meniscus tear) and pelvic fracture with external fixation due to fall off ladder contributing to functional limitations including limited mobility, strength, and ROM . Patient would benefit from skilled PT to address the aforementioned deficits. Patient/family received education on the purpose of therapy, participated in the development of the POC and verbalized understanding and agreement of POC, goals.   Rehabilitation Potential: Based on this therapist'[name] assessment, NOX TALENT has the following rehabilitation potential for the PT goals stated below: : good   Plan Of Care:    Patient/Family Goals: By discharge, pt would like to:   Short Term Goals: To be met by 4 week  initial status    patient will improve L knee flexibility / ROM to full ROM in order to ambulate pain free.  L knee ROM 38-115 degrees    patient will ambulate 500 feet on all surfaces using axillary crutches independently .  Unable to ambulate currently due to pain/swelling      longTerm Goals: To be met by 8 weeks  Initial status    patient will reduce pain to 0 /10 on 0-10 pain scale to allow pain free movement.  L knee pain 8-9/10 with movement    Decrease L knee swelling to normal range  L knee swelling (see above for measurements)        ______________________________________________________________________  Pain:8-9/10 Location: L knee                        Precautions: TTWB RLE (physician instructed)    Activity Date: 04/01/16 Date:  Date: Date:    Visit # 1/16      G Code # (if needed)         CPT code  Daily treatment record Timed Coded Treatment Minutes   PT Evaluation: 97001 Eval completed, POC established 77'   Neuromuscular Re-education: 97112 -QS, HS, GS, gentle heel slides, SLR  -elevation, ice to L knees 15'    Total Treatment TIme:  66'         Pain:   L knee pain flexed 2/10; extended  2-3/10                                      Visit #: 3 / 6-12  Exercise Log:   04/10/16    v2 05/05/16   v3 05/12/16  v4    HS supine str R only H60x1 R/L  H90x2     HS seated str L only H15x2 R/L  H90x2     gastroc str Difficult d/t BLE / WB restrictions Completed during seated / supine HS w/ strap  AP'[name]     Marching Supine  X10;   Seated  x20 Supine  x15  Seated  x20     SLR'[name] R  X5;   L x4 w/ mod A R/L   x20     LAQ'[name] L max A d/t pain  x5 R/L w/ ball   x20     AP'[name] / circles cw/ccw x20  each R/L  x20ea     GS / QS H5x5 each Reminders to engage during all TE            Hip abd/add - R/L   x15     Prone hip ext - R/L  2x5 R/L   2x10      knee flex - R/L  x20 R/L  x20           bridging - - x20    unilat bridge - - R/L  x15                  Heel raises - - 2x10    Standing 3-way hips in II bars - - R/L  F / E / ABD  x15 ea    Standing in II bars - Lateral  wt-shift R/L; marching then  2x10 BLE lifts / alternating Marching in II bars  4 x10 - single HH    Gait in gym - - W/ RW  3x50'  CGA    Step negotiation - - Up/down 4x6 steps  Step-to pattern x2 w/ CGA    CP / L knee - declined declined    sci-fit - 1.0x47min 2.0x52min      Subjective:  .[name].I was sore after I worked out last week - took a couple Tylenol this morning too....  Objective:  Treatment per flow sheet - he began with sci-fit / light cardio warm-up then he demonstrated standing /  closed-chain kinetics / WBing in II bars today - marching, B hip TE, heel raises - moved him onto mat therex then initiated ambulating with RW in gym.  Introduced step negotiation in gym at [employer] he did step-to pattern using bilateral handrailings.  Declined CP at EOS.  Assessment:  GOOD tolerance to today'[name] closed-chain challenges - he seems very hesitant at times to WB thru LLE - did well with ambulation skills and initial step negotiation.  Plan:  Continue with POC - expanding closed-chain kinetic TE - F/U with Dr Cloyde Reams end of September - MED-BEND /( MEDICAID product?)       MINUTES of TREATMENT   Evaluation/ Re evaluation    Therapeutic Exercise 57   Therapeutic Activity    Gait Training    Manual Therapy    Iontophoresis    Ultrasound    Electrical Stimulation    Neuromuscular Rehab (balance training)    Ice/ Heat    Total Treatment Time 57       PHYSICAL THERAPY PROGRESS NOTES -      Diagnosis:   1. Gait difficulty     2. Peripheral tear of lateral meniscus of left knee, unspecified whether old or current tear, subsequent encounter  Referring Provider:Archdeacon, Casimiro Needle, MD    Insurance plan:   Payor/Plan Subscr DOB Sex Relation Sub. Ins. ID Effective Group Num   1. GENERIC COMME* Colin [name],Colin [name] 13-Apr-1961 Male  [id number] 02/23/16 [id number]                                   [address], [place] [address]                         # of visits per insurance authorization:20  # of visits per POC: 16    Date of Initial Eval: 04/01/16    Goals of Therapy:      Assessment:    Assessment/Problem List: Patient presents with swollen/painful L knee (left lateral meniscus tear) and pelvic fracture with external fixation due to fall off ladder contributing to functional limitations including limited mobility, strength, and ROM . Patient would benefit from skilled PT to address the aforementioned deficits. Patient/family received education on the purpose of therapy, participated in the development of the POC and  verbalized understanding and agreement of POC, goals.   Rehabilitation Potential: Based on this therapist'[name] assessment, Colin [name] has the following rehabilitation potential for the PT goals stated below: : good   Plan Of Care:    Patient/Family Goals: By discharge, pt would like to:   Short Term Goals: To be met by 4 week  initial status    patient will improve L knee flexibility / ROM to full ROM in order to ambulate pain free.  L knee ROM 38-115 degrees    patient will ambulate 500 feet on all surfaces using axillary crutches independently .  Unable to ambulate currently due to pain/swelling      longTerm Goals: To be met by 8 weeks  Initial status    patient will reduce pain to 0 /10 on 0-10 pain scale to allow pain free movement.  L knee pain 8-9/10 with movement    Decrease L knee swelling to normal range  L knee swelling (see above for measurements)       ______________________________________________________________________  Pain:8-9/10 Location: L knee                        Precautions: TTWB RLE (physician instructed)    Activity Date: 04/01/16 Date:  Date: Date:    Visit # 1/16      G Code # (if needed)         CPT code  Daily treatment record Timed Coded Treatment Minutes   PT Evaluation: 97001 Eval completed, POC established 18'   Neuromuscular Re-education: 97112 -QS, HS, GS, gentle heel slides, SLR  -elevation, ice to L knees 15'    Total Treatment TIme:  91'         Pain:   L knee pain flexed  1 /10                                    Visit #: 8 / 6-12    Exercise Log:   04/10/16    v2 05/05/16   v3 05/12/16  v4 05/14/16  v5 05/19/16  v6 05/21/16   v7 05/26/16  v8   HS supine str R only H60x1 R/L  H90x2   W/strap  H75x2 H90x2  HS seated str L only H15x2 R/L  H90x2   W/strap  H75x2 H90x2    gastroc str Difficult d/t BLE / WB restrictions Completed during seated / supine HS w/ strap  AP'[name]   H60x1 H45x2 H90x1   HS stretch on step - - - - - - -   Hip flexor str on step - - - - - - -             Marching  Supine  X10;   Seated  x20 Supine  x15  Seated  x20        SLR'[name] R  X5;   L x4 w/ mod A R/L   x20    2#   R/L  x15    LAQ'[name] L max A d/t pain  x5 R/L w/ ball   x20    2#  W/ ball  2x10    AP'[name] / circles cw/ccw x20  each R/L  x20ea        GS / QS H5x5 each Reminders to engage during all TE        SAQ'[name] - - - - - 2#  R/L  H5x20    Hip abd/add - R/L   x15        Prone hip ext - R/L  2x5 R/L   2x10 R/L  x25 R/L x20 2#  R/L   2x10      knee flex - R/L  x20 R/L  x20 R/L  x20 x25 2#  R/L  x15              bridging - - x20 W/ ball at knees   2x15  W/ ball  2x20    unilat bridge - - R/L  x15 R/L x20 painful RL      hooklying marching - - - 2x15                Lunges on BOSU - - - R/L  x12 R/L  x15  R/L  x15   Lateral stepping on BOSU - - - R  X15;     L  x10 R x15;   L x5 painful  R  X20;   LLE  Not done   ASU on BOSU - - - -   R/L   x20             Heel raises - - 2x10  2x15  x30   Standing 3-way hips in II bars - - R/L  F / E / ABD  x15 ea  F / ABD  R/L  x20 F / E /ABD  R/L  x20 F / E / ABD  Added 2# wt  R/L  x12 --   Standing in II bars - Lateral  wt-shift R/L; marching then  2x10 BLE lifts / alternating Marching in II bars  4 x10 - single HH -  Standing knee flex   2# wt  R/L   2x10 At cable column   F / E / ABD  10#    R  2x10;   L  x15   Gait in gym - - W/ RW  3x50'  CGA W/ RW  3x50'   SBA/A Adjusted his RW to proper height Without AD in therapy gym 3x45' Entered gym No AD    Step negotiation - - Up/down 4x6 steps  Step-to pattern x2 w/ CGA x4 using B HR'[name]  Reciprocal up; step to down  W/ SBA/[name] 4x6 steps  x4 using only 1 - HR / reciprocal  4x6 steps  Reciprocal pattern   Up no HR;   Down 1-HR  x5   CP / L knee - declined declined declined declined CP / B knees  x80min CP  L knee x10   sci-fit - 1.0x5min 2.0x82min 3.5x66min XRIDE  L-8x58min XRIDE  L-8  x23min XRIDE  L-10  x54min     Subjective:  .[name] I took 2 Tylenol this morning - I didn't use the walker at all over the weekend....  Objective:  Treatment per flow  sheet - ambulated into PT gym w/o AD - Warm-up on X-RIDE - Added standing  BLE / hip at cable column this session.  He was able to demonstrate new TE as well as previous challenges needing minimal correctional instructions / cuing for safe therex technique.  CP to L knee at EOS for comfort.  Assessment:  He appears to be responding favorably to therapeutic interventions - no AD device upon entering gym this session!   Plan:  Continue with POC - add BLE flexibility TE at steps - expanding closed-chain kinetic TE - F/U with Dr Cloyde Reams 06/30/16       MINUTES of TREATMENT   Evaluation/ Re evaluation    Therapeutic Exercise 53   Therapeutic Activity    Gait Training    Manual Therapy    Iontophoresis    Ultrasound    Electrical Stimulation    Neuromuscular Rehab (balance training)    Ice/ Heat 10   Total Treatment Time 63

## 2016-05-28 ENCOUNTER — Encounter: Admit: 2016-05-28 | Discharge: 2016-05-28 | Payer: PRIVATE HEALTH INSURANCE

## 2016-05-28 DIAGNOSIS — R269 Unspecified abnormalities of gait and mobility: Secondary | ICD-10-CM

## 2016-05-28 NOTE — Unmapped (Signed)
PHYSICAL THERAPY PROGRESS NOTES -      Diagnosis:   1. Gait difficulty     2. Peripheral tear of lateral meniscus of left knee, unspecified whether old or current tear, subsequent encounter             Referring Provider:Archdeacon, Casimiro Needle, MD    Insurance plan:   Payor/Plan Subscr DOB Sex Relation Sub. Ins. ID Effective Group Num   1. GENERIC COMME* Colin [name],Colin [name] [birth year] Male  [id number] 02/23/16 [id number]                                   [address], [place] [address]                         # of visits per insurance authorization:20  # of visits per POC: 16    Date of Initial Eval: 04/01/16    Goals of Therapy:      Assessment:    Assessment/Problem List: Patient presents with swollen/painful L knee (left lateral meniscus tear) and pelvic fracture with external fixation due to fall off ladder contributing to functional limitations including limited mobility, strength, and ROM . Patient would benefit from skilled PT to address the aforementioned deficits. Patient/family received education on the purpose of therapy, participated in the development of the POC and verbalized understanding and agreement of POC, goals.   Rehabilitation Potential: Based on this therapist'[name] assessment, Colin [name] has the following rehabilitation potential for the PT goals stated below: : good   Plan Of Care:    Patient/Family Goals: By discharge, pt would like to:   Short Term Goals: To be met by 4 week  initial status    patient will improve L knee flexibility / ROM to full ROM in order to ambulate pain free.  L knee ROM 38-115 degrees    patient will ambulate 500 feet on all surfaces using axillary crutches independently .  Unable to ambulate currently due to pain/swelling      longTerm Goals: To be met by 8 weeks  Initial status    patient will reduce pain to 0 /10 on 0-10 pain scale to allow pain free movement.  L knee pain 8-9/10 with movement    Decrease L knee swelling to normal range  L knee swelling (see above for measurements)        ______________________________________________________________________  Pain:8-9/10 Location: L knee                        Precautions: TTWB RLE (physician instructed)    Activity Date: 04/01/16 Date:  Date: Date:    Visit # 1/16      G Code # (if needed)         CPT code  Daily treatment record Timed Coded Treatment Minutes   PT Evaluation: 97001 Eval completed, POC established 29'   Neuromuscular Re-education: 97112 -QS, HS, GS, gentle heel slides, SLR  -elevation, ice to L knees 15'    Total Treatment TIme:  38'         Pain:   L knee pain flexed 2/10; extended  2-3/10                                      Visit #: 3 / 6-12  Exercise Log:   04/10/16    v2 05/05/16   v3 05/12/16  v4    HS supine str R only H60x1 R/L  H90x2     HS seated str L only H15x2 R/L  H90x2     gastroc str Difficult d/t BLE / WB restrictions Completed during seated / supine HS w/ strap  AP'[name]     Marching Supine  X10;   Seated  x20 Supine  x15  Seated  x20     SLR'[name] R  X5;   L x4 w/ mod A R/L   x20     LAQ'[name] L max A d/t pain  x5 R/L w/ ball   x20     AP'[name] / circles cw/ccw x20  each R/L  x20ea     GS / QS H5x5 each Reminders to engage during all TE            Hip abd/add - R/L   x15     Prone hip ext - R/L  2x5 R/L   2x10      knee flex - R/L  x20 R/L  x20           bridging - - x20    unilat bridge - - R/L  x15                  Heel raises - - 2x10    Standing 3-way hips in II bars - - R/L  F / E / ABD  x15 ea    Standing in II bars - Lateral  wt-shift R/L; marching then  2x10 BLE lifts / alternating Marching in II bars  4 x10 - single HH    Gait in gym - - W/ RW  3x50'  CGA    Step negotiation - - Up/down 4x6 steps  Step-to pattern x2 w/ CGA    CP / L knee - declined declined    sci-fit - 1.0x76min 2.0x56min      Subjective:  .[name].I was sore after I worked out last week - took a couple Tylenol this morning too....  Objective:  Treatment per flow sheet - he began with sci-fit / light cardio warm-up then he demonstrated standing /  closed-chain kinetics / WBing in II bars today - marching, B hip TE, heel raises - moved him onto mat therex then initiated ambulating with RW in gym.  Introduced step negotiation in gym at [employer] he did step-to pattern using bilateral handrailings.  Declined CP at EOS.  Assessment:  GOOD tolerance to today'[name] closed-chain challenges - he seems very hesitant at times to WB thru LLE - did well with ambulation skills and initial step negotiation.  Plan:  Continue with POC - expanding closed-chain kinetic TE - F/U with Dr Cloyde Reams end of September - MED-BEND /( MEDICAID product?)       MINUTES of TREATMENT   Evaluation/ Re evaluation    Therapeutic Exercise 57   Therapeutic Activity    Gait Training    Manual Therapy    Iontophoresis    Ultrasound    Electrical Stimulation    Neuromuscular Rehab (balance training)    Ice/ Heat    Total Treatment Time 57       PHYSICAL THERAPY PROGRESS NOTES -      Diagnosis:   1. Gait difficulty     2. Peripheral tear of lateral meniscus of left knee, unspecified whether old or current tear, subsequent encounter  Referring Provider:Archdeacon, Casimiro Needle, MD    Insurance plan:   Payor/Plan Subscr DOB Sex Relation Sub. Ins. ID Effective Group Num   1. GENERIC COMME* Colin [name],Colin [name] 03-29-1961 Male  [id number] 02/23/16 [id number]                                   [address], [place] [address]                         # of visits per insurance authorization:20  # of visits per POC: 16    Date of Initial Eval: 04/01/16    Goals of Therapy:      Assessment:    Assessment/Problem List: Patient presents with swollen/painful L knee (left lateral meniscus tear) and pelvic fracture with external fixation due to fall off ladder contributing to functional limitations including limited mobility, strength, and ROM . Patient would benefit from skilled PT to address the aforementioned deficits. Patient/family received education on the purpose of therapy, participated in the development of the POC and  verbalized understanding and agreement of POC, goals.   Rehabilitation Potential: Based on this therapist'[name] assessment, Colin [name] has the following rehabilitation potential for the PT goals stated below: : good   Plan Of Care:    Patient/Family Goals: By discharge, pt would like to:   Short Term Goals: To be met by 4 week  initial status    patient will improve L knee flexibility / ROM to full ROM in order to ambulate pain free.  L knee ROM 38-115 degrees    patient will ambulate 500 feet on all surfaces using axillary crutches independently .  Unable to ambulate currently due to pain/swelling      longTerm Goals: To be met by 8 weeks  Initial status    patient will reduce pain to 0 /10 on 0-10 pain scale to allow pain free movement.  L knee pain 8-9/10 with movement    Decrease L knee swelling to normal range  L knee swelling (see above for measurements)       ______________________________________________________________________  Pain:8-9/10 Location: L knee                        Precautions: TTWB RLE (physician instructed)    Activity Date: 04/01/16 Date:  Date: Date:    Visit # 1/16      G Code # (if needed)         CPT code  Daily treatment record Timed Coded Treatment Minutes   PT Evaluation: 97001 Eval completed, POC established 28'   Neuromuscular Re-education: 97112 -QS, HS, GS, gentle heel slides, SLR  -elevation, ice to L knees 15'    Total Treatment TIme:  53'         Pain:   L knee pain flexed  3 /10                                    Visit #: 9 / 6-12    Exercise Log:   05/12/16  v4 05/14/16  v5 05/19/16  v6 05/21/16   v7 05/26/16  v8 05/28/16  v9    HS supine str   W/strap  H75x2 H90x2      HS seated str   W/strap  H75x2 H90x2  gastroc str   H60x1 H45x2 H90x1 H75x1    HS stretch on step - - - - - H30x4    Hip flexor str on step - - - - - H30x4              Marching          SLR'[name]    2#   R/L  x15      LAQ'[name]    2#  W/ ball  2x10      AP'[name] / circles cw/ccw          GS / QS          SAQ'[name] - - - 2#   R/L  H5x20      Hip abd/add          Prone hip ext R/L   2x10 R/L  x25 R/L x20 2#  R/L   2x10        knee flex R/L  x20 R/L  x20 x25 2#  R/L  x15                bridging x20 W/ ball at knees   2x15  W/ ball  2x20  x20    unilat bridge R/L  x15 R/L x20 painful RL    R/L  x20    hooklying marching - 2x15    -    hooklying hip ADD w/ ball      H5x15              Prone hip ext      R/L  x10    Prone BUE lifts      R/L  x10    Prone upper body lift      H5x7    Prone BLE lifts      H5x7    Superman      H5x5              Marching on BOSU      4x30sec in II bars needed HH for     Lunges on BOSU - R/L  x12 R/L  x15  R/L  x15 R/L  x15    Lateral stepping on BOSU - R  X15;     L  x10 R x15;   L x5 painful  R  X20;   LLE  Not done     ASU on BOSU - -   R/L   x20 R/L  x20              Heel raises 2x10  2x15  x30     Standing 3-way hips in II bars R/L  F / E / ABD  x15 ea  F / ABD  R/L  x20 F / E /ABD  R/L  x20 F / E / ABD  Added 2# wt  R/L  x12 --     Standing in II bars Marching in II bars  4 x10 - single HH -  Standing knee flex   2# wt  R/L   2x10 At cable column   F / E / ABD  10#    R  2x10;   L  x15     Gait in gym W/ RW  3x50'  CGA W/ RW  3x50'   SBA/A Adjusted his RW to proper height Without AD in therapy gym 3x45' Entered gym No AD  No AD today    Step negotiation Up/down 4x6 steps  Step-to pattern x2 w/ CGA x4 using B HR'[name]  Reciprocal up; step to down  W/ SBA/[name] 4x6 steps  x4 using only 1 - HR / reciprocal  4x6 steps  Reciprocal pattern   Up no HR;   Down 1-HR  x5     CP / L knee declined declined declined CP / B knees  x11min CP  L knee x10 declined    sci-fit 2.0x50min 3.5x71min XRIDE  L-8x66min XRIDE  L-8  x78min XRIDE  L-10  x87min XRIDE  L-10x81min      Subjective:  .[name] I'm more sore today - but walking better I think....  Objective:  Treatment per flow sheet - light cardiac warm-up on XRIDE followed by BLE / LE flexibility challenges / added ones on steps.  Introduced prone BUE / BLE core stabilization  therex.  He performed portion of therex regimen today - good   Assessment:  GOOD response to therapeutic interventions up to this time - improved ambulation quality w/o AD, narrowed BOS, equalized step-length and dynamic standing balance GOOD-/GOOD  Plan:  Continue with POC - expanding closed-chain kinetic TE - F/U with Dr Cloyde Reams 06/30/16       MINUTES of TREATMENT   Evaluation/ Re evaluation    Therapeutic Exercise 33   Therapeutic Activity    Gait Training    Manual Therapy    Iontophoresis    Ultrasound    Electrical Stimulation    Neuromuscular Rehab (balance training)    Ice/ Heat    Total Treatment Time 33

## 2016-06-02 ENCOUNTER — Ambulatory Visit: Admit: 2016-06-02 | Discharge: 2016-06-02 | Payer: PRIVATE HEALTH INSURANCE

## 2016-06-02 DIAGNOSIS — R269 Unspecified abnormalities of gait and mobility: Secondary | ICD-10-CM

## 2016-06-02 NOTE — Progress Notes (Signed)
PHYSICAL THERAPY PROGRESS NOTES -      Diagnosis:   1. Gait difficulty     2. Peripheral tear of lateral meniscus of left knee, unspecified whether old or current tear, subsequent encounter             Referring Provider:Archdeacon, Colin Needle, MD    Insurance plan:   Payor/Plan Subscr DOB Sex Relation Sub. Ins. ID Effective Group Num   1. GENERIC COMME* [name],[name] 1961-03-02 Male  [id number] 02/23/16 [id number]                                   [address], [place] [address]                         # of visits per insurance authorization:20  # of visits per POC: 16    Date of Initial Eval: 04/01/16    Goals of Therapy:      Assessment:    Assessment/Problem List: Patient presents with swollen/painful L knee (left lateral meniscus tear) and pelvic fracture with external fixation due to fall off ladder contributing to functional limitations including limited mobility, strength, and ROM . Patient would benefit from skilled PT to address the aforementioned deficits. Patient/family received education on the purpose of therapy, participated in the development of the POC and verbalized understanding and agreement of POC, goals.   Rehabilitation Potential: Based on this therapist's assessment, Colin [name] has the following rehabilitation potential for the PT goals stated below: : good   Plan Of Care:    Patient/Family Goals: By discharge, pt would like to:   Short Term Goals: To be met by 4 week  initial status    patient will improve L knee flexibility / ROM to full ROM in order to ambulate pain free.  L knee ROM 38-115 degrees    patient will ambulate 500 feet on all surfaces using axillary crutches independently .  Unable to ambulate currently due to pain/swelling      longTerm Goals: To be met by 8 weeks  Initial status    patient will reduce pain to 0 /10 on 0-10 pain scale to allow pain free movement.  L knee pain 8-9/10 with movement    Decrease L knee swelling to normal range  L knee swelling (see above for measurements)        ______________________________________________________________________  Pain:8-9/10 Location: L knee                        Precautions: TTWB RLE (physician instructed)    Activity Date: 04/01/16 Date:  Date: Date:    Visit # 1/16      G Code # (if needed)         CPT code  Daily treatment record Timed Coded Treatment Minutes   PT Evaluation: 97001 Eval completed, POC established 51'   Neuromuscular Re-education: 97112 -QS, HS, GS, gentle heel slides, SLR  -elevation, ice to L knees 15'    Total Treatment TIme:  58'         Pain:   L knee pain flexed 2/10; extended  2-3/10                                      Visit #: 3 / 6-12  Exercise Log:   04/10/16    v2 05/05/16   v3 05/12/16  v4    HS supine str R only H60x1 R/L  H90x2     HS seated str L only H15x2 R/L  H90x2     gastroc str Difficult d/t BLE / WB restrictions Completed during seated / supine HS w/ strap  AP's     Marching Supine  X10;   Seated  x20 Supine  x15  Seated  x20     SLR's R  X5;   L x4 w/ mod A R/L   x20     LAQ's L max A d/t pain  x5 R/L w/ ball   x20     AP's / circles cw/ccw x20  each R/L  x20ea     GS / QS H5x5 each Reminders to engage during all TE            Hip abd/add - R/L   x15     Prone hip ext - R/L  2x5 R/L   2x10      knee flex - R/L  x20 R/L  x20           bridging - - x20    unilat bridge - - R/L  x15                  Heel raises - - 2x10    Standing 3-way hips in II bars - - R/L  F / E / ABD  x15 ea    Standing in II bars - Lateral  wt-shift R/L; marching then  2x10 BLE lifts / alternating Marching in II bars  4 x10 - single HH    Gait in gym - - W/ RW  3x50'  CGA    Step negotiation - - Up/down 4x6 steps  Step-to pattern x2 w/ CGA    CP / L knee - declined declined    sci-fit - 1.0x44min 2.0x28min      Subjective:  .[name].I was sore after I worked out last week - took a couple Tylenol this morning too....  Objective:  Treatment per flow sheet - he began with sci-fit / light cardio warm-up then he demonstrated standing /  closed-chain kinetics / WBing in II bars today - marching, B hip TE, heel raises - moved him onto mat therex then initiated ambulating with RW in gym.  Introduced step negotiation in gym at [employer] he did step-to pattern using bilateral handrailings.  Declined CP at EOS.  Assessment:  GOOD tolerance to today's closed-chain challenges - he seems very hesitant at times to WB thru LLE - did well with ambulation skills and initial step negotiation.  Plan:  Continue with POC - expanding closed-chain kinetic TE - F/U with Dr Cloyde Reams end of September - MED-BEND /( MEDICAID product?)       MINUTES of TREATMENT   Evaluation/ Re evaluation    Therapeutic Exercise 57   Therapeutic Activity    Gait Training    Manual Therapy    Iontophoresis    Ultrasound    Electrical Stimulation    Neuromuscular Rehab (balance training)    Ice/ Heat    Total Treatment Time 57       PHYSICAL THERAPY PROGRESS NOTES -      Diagnosis:   1. Gait difficulty     2. Peripheral tear of lateral meniscus of left knee, unspecified whether old or current tear, subsequent encounter  Referring Provider:Archdeacon, Colin Needle, MD    Insurance plan:   Payor/Plan Subscr DOB Sex Relation Sub. Ins. ID Effective Group Num   1. GENERIC COMME* [name],[name] 04-20-1961 Male  [id number] 02/23/16 [id number]                                   [address], [place] [address]                         # of visits per insurance authorization:20  # of visits per POC: 16    Date of Initial Eval: 04/01/16    Goals of Therapy:      Assessment:    Assessment/Problem List: Patient presents with swollen/painful L knee (left lateral meniscus tear) and pelvic fracture with external fixation due to fall off ladder contributing to functional limitations including limited mobility, strength, and ROM . Patient would benefit from skilled PT to address the aforementioned deficits. Patient/family received education on the purpose of therapy, participated in the development of the POC and  verbalized understanding and agreement of POC, goals.   Rehabilitation Potential: Based on this therapist's assessment, Colin [name] has the following rehabilitation potential for the PT goals stated below: : good   Plan Of Care:    Patient/Family Goals: By discharge, pt would like to:   Short Term Goals: To be met by 4 week  initial status    patient will improve L knee flexibility / ROM to full ROM in order to ambulate pain free.  L knee ROM 38-115 degrees    patient will ambulate 500 feet on all surfaces using axillary crutches independently .  Unable to ambulate currently due to pain/swelling      longTerm Goals: To be met by 8 weeks  Initial status    patient will reduce pain to 0 /10 on 0-10 pain scale to allow pain free movement.  L knee pain 8-9/10 with movement    Decrease L knee swelling to normal range  L knee swelling (see above for measurements)       ______________________________________________________________________  Pain:8-9/10 Location: L knee                        Precautions: TTWB RLE (physician instructed)    Activity Date: 04/01/16 Date:  Date: Date:    Visit # 1/16      G Code # (if needed)         CPT code  Daily treatment record Timed Coded Treatment Minutes   PT Evaluation: 97001 Eval completed, POC established 73'   Neuromuscular Re-education: 97112 -QS, HS, GS, gentle heel slides, SLR  -elevation, ice to L knees 15'    Total Treatment TIme:  32'         Pain:   L knee pain flexed  3 /10                                    Visit #: 10 / 6-12    Exercise Log:   05/12/16  v4 05/14/16  v5 05/19/16  v6 05/21/16   v7 05/26/16  v8 05/28/16  v9 06/02/16   v10   HS supine str   W/strap  H75x2 H90x2   W/ strap  H90 x2   HS seated  str   W/strap  H75x2 H90x2   H90x2   gastroc str   H60x1 H45x2 H90x1 H75x1 H90x1   HS stretch on step - - - - - H30x4    Hip flexor str on step - - - - - H30x4              Marching          SLR's    2#   R/L  x15      LAQ's    2#  W/ ball  2x10      AP's / circles cw/ccw           GS / QS          SAQ's - - - 2#  R/L  H5x20      Hip abd/add       S/Ling w/ LE on chair - unilat LE lifts / core lifts   2x10 each side   Prone hip ext R/L   2x10 R/L  x25 R/L x20 2#  R/L   2x10        knee flex R/L  x20 R/L  x20 x25 2#  R/L  x15                bridging x20 W/ ball at knees   2x15  W/ ball  2x20  x20 x20  Easy!   unilat bridge R/L  x15 R/L x20 painful RL    R/L  x20 R/L  x20  easy1   hooklying marching - 2x15    - Marching while bridging   3x10   hooklying hip ADD w/ ball      H5x15              Prone hip ext      R/L  x10    Prone BUE lifts      R/L  x10    Prone upper body lift      H5x7    Prone BLE lifts      H5x7    Superman      H5x5    Tossing ball while standing on BOSU       2x60sec   Marching on BOSU      4x30sec in II bars needed HH for balance 4x30sec marching   Lunges on BOSU - R/L  x12 R/L  x15  R/L  x15 R/L  x15 R/L  x20   Lateral stepping on BOSU - R  X15;     L  x10 R x15;   L x5 painful  R  X20;   LLE  Not done  R/L  x15 ; better on LLE   ASU on BOSU - -   R/L   x20 R/L  x20    PLANK / elbows-toes       Introduced  H5x5   Heel raises 2x10  2x15  x30     Standing 3-way hips in II bars R/L  F / E / ABD  x15 ea  F / ABD  R/L  x20 F / E /ABD  R/L  x20 F / E / ABD  Added 2# wt  R/L  x12 --     Standing in II bars Marching in II bars  4 x10 - single HH -  Standing knee flex   2# wt  R/L   2x10 At cable column   F / E / ABD  10#  R  2x10;   L  x15     Gait in gym W/ RW  3x50'  CGA W/ RW  3x50'   SBA/A Adjusted his RW to proper height Without AD in therapy gym 3x45' Entered gym No AD  No AD today No AD today   Step negotiation Up/down 4x6 steps  Step-to pattern x2 w/ CGA x4 using B HR's  Reciprocal up; step to down  W/ SBA/S 4x6 steps  x4 using only 1 - HR / reciprocal  4x6 steps  Reciprocal pattern   Up no HR;   Down 1-HR  x5     CP / L knee declined declined declined CP / B knees  x26min CP  L knee x10 declined -   sci-fit 2.0x32min 3.5x74min XRIDE  L-8x33min XRIDE  L-8   x70min XRIDE  L-10  x66min XRIDE  L-10x36min XRIDE  L-8 x39min     Subjective:  ..moving better I think - even just walking is easier - I'm supposed to go back to work next week.Marland KitchenMarland KitchenI'll call to make sure I can come do appts...  Objective:  Treatment per flow sheet - cardiac warm-up on XRIDE followed by BLE flexibility, then focused on ADDUCTOR strengthening in S/Ling w/ single LE on chair doing hip lifts.  Introduced PLANKING (elbows / toes) - demo'd BOSU ball TE added ball tossing while maintaining BLE balance on BOSU.  He declined any ice / heat at EOS citing feel good.  Assessment:   Good recovery from original pelvic fracture / R-side pinning - continues to have weakness in R adductor mm - soreness / burning sensation occasionally on the R side.  Tolerating interventions well though - good therapy participant.  Very little antalgia noted during ambulation w/o AD  Plan:  Planning on his RTW on 06/09/16 - planning discharge in next 5-7v - F/U with Dr Cloyde Reams 06/30/16       MINUTES of TREATMENT   Evaluation/ Re evaluation    Therapeutic Exercise 54   Therapeutic Activity    Gait Training    Manual Therapy    Iontophoresis    Ultrasound    Electrical Stimulation    Neuromuscular Rehab (balance training)    Ice/ Heat    Total Treatment Time 54

## 2016-06-04 ENCOUNTER — Ambulatory Visit: Admit: 2016-06-04 | Discharge: 2016-06-04 | Payer: PRIVATE HEALTH INSURANCE

## 2016-06-04 DIAGNOSIS — R269 Unspecified abnormalities of gait and mobility: Secondary | ICD-10-CM

## 2016-06-04 NOTE — Unmapped (Signed)
PHYSICAL THERAPY PROGRESS NOTES -      Diagnosis:   1. Gait difficulty             Referring Provider:Archdeacon, Casimiro Needle, MD    Insurance plan:   Payor/Plan Subscr DOB Sex Relation Sub. Ins. ID Effective Group Num   1. GENERIC COMME* Colin [name] S 08-09-1961 Male  [id number] 02/23/16 [id number]                                   [address], [place] [address]                         # of visits per insurance authorization:20  # of visits per POC: 16    Date of Initial Eval: 04/01/16    Goals of Therapy:      Assessment:    Assessment/Problem List: Patient presents with swollen/painful L knee (left lateral meniscus tear) and pelvic fracture with external fixation due to fall off ladder contributing to functional limitations including limited mobility, strength, and ROM . Patient would benefit from skilled PT to address the aforementioned deficits. Patient/family received education on the purpose of therapy, participated in the development of the POC and verbalized understanding and agreement of POC, goals.   Rehabilitation Potential: Based on this therapist's assessment, Colin [name] has the following rehabilitation potential for the PT goals stated below: : good   Plan Of Care:    Patient/Family Goals: By discharge, pt would like to:   Short Term Goals: To be met by 4 week  initial status    patient will improve L knee flexibility / ROM to full ROM in order to ambulate pain free.  L knee ROM 38-115 degrees    patient will ambulate 500 feet on all surfaces using axillary crutches independently .  Unable to ambulate currently due to pain/swelling      longTerm Goals: To be met by 8 weeks  Initial status    patient will reduce pain to 0 /10 on 0-10 pain scale to allow pain free movement.  L knee pain 8-9/10 with movement    Decrease L knee swelling to normal range  L knee swelling (see above for measurements)       ______________________________________________________________________  Pain:8-9/10 Location: L knee                         Precautions: TTWB RLE (physician instructed)    Activity Date: 04/01/16 Date:  Date: Date:    Visit # 1/16      G Code # (if needed)         CPT code  Daily treatment record Timed Coded Treatment Minutes   PT Evaluation: 97001 Eval completed, POC established 81'   Neuromuscular Re-education: 97112 -QS, HS, GS, gentle heel slides, SLR  -elevation, ice to L knees 15'    Total Treatment TIme:  28'         Pain:   L knee pain flexed 2/10; extended  2-3/10                                      Visit #: 3 / 6-12    Exercise Log:   04/10/16    v2 05/05/16   v3 05/12/16  v4  HS supine str R only H60x1 R/L  H90x2     HS seated str L only H15x2 R/L  H90x2     gastroc str Difficult d/t BLE / WB restrictions Completed during seated / supine HS w/ strap  AP's     Marching Supine  X10;   Seated  x20 Supine  x15  Seated  x20     SLR's R  X5;   L x4 w/ mod A R/L   x20     LAQ's L max A d/t pain  x5 R/L w/ ball   x20     AP's / circles cw/ccw x20  each R/L  x20ea     GS / QS H5x5 each Reminders to engage during all TE            Hip abd/add - R/L   x15     Prone hip ext - R/L  2x5 R/L   2x10      knee flex - R/L  x20 R/L  x20           bridging - - x20    unilat bridge - - R/L  x15                  Heel raises - - 2x10    Standing 3-way hips in II bars - - R/L  F / E / ABD  x15 ea    Standing in II bars - Lateral  wt-shift R/L; marching then  2x10 BLE lifts / alternating Marching in II bars  4 x10 - single HH    Gait in gym - - W/ RW  3x50'  CGA    Step negotiation - - Up/down 4x6 steps  Step-to pattern x2 w/ CGA    CP / L knee - declined declined    sci-fit - 1.0x71min 2.0x75min      Subjective:  .[name].I was sore after I worked out last week - took a couple Tylenol this morning too....  Objective:  Treatment per flow sheet - he began with sci-fit / light cardio warm-up then he demonstrated standing / closed-chain kinetics / WBing in II bars today - marching, B hip TE, heel raises - moved him onto mat therex then initiated  ambulating with RW in gym.  Introduced step negotiation in gym at [employer] he did step-to pattern using bilateral handrailings.  Declined CP at EOS.  Assessment:  GOOD tolerance to today's closed-chain challenges - he seems very hesitant at times to WB thru LLE - did well with ambulation skills and initial step negotiation.  Plan:  Continue with POC - expanding closed-chain kinetic TE - F/U with Dr Cloyde Reams end of September - MED-BEND /( MEDICAID product?)       MINUTES of TREATMENT   Evaluation/ Re evaluation    Therapeutic Exercise 57   Therapeutic Activity    Gait Training    Manual Therapy    Iontophoresis    Ultrasound    Electrical Stimulation    Neuromuscular Rehab (balance training)    Ice/ Heat    Total Treatment Time 57       PHYSICAL THERAPY PROGRESS NOTES -      Diagnosis:   1. Gait difficulty             Referring Provider:Archdeacon, Casimiro Needle, MD    Insurance plan:   Payor/Plan Subscr DOB Sex Relation Sub. Ins. ID Effective Group Num   1. GENERIC COMME* Colin [name], Colin [name] 1960/09/12 Male  [id number] 02/23/16 [id number]                                   [address], [place] [address]                         # of visits per insurance authorization:20  # of visits per POC: 16    Date of Initial Eval: 04/01/16    Goals of Therapy:      Assessment:    Assessment/Problem List: Patient presents with swollen/painful L knee (left lateral meniscus tear) and pelvic fracture with external fixation due to fall off ladder contributing to functional limitations including limited mobility, strength, and ROM . Patient would benefit from skilled PT to address the aforementioned deficits. Patient/family received education on the purpose of therapy, participated in the development of the POC and verbalized understanding and agreement of POC, goals.   Rehabilitation Potential: Based on this therapist's assessment, Colin [name] has the following rehabilitation potential for the PT goals stated below: : good   Plan Of Care:    Patient/Family  Goals: By discharge, pt would like to:   Short Term Goals: To be met by 4 week  initial status    patient will improve L knee flexibility / ROM to full ROM in order to ambulate pain free.  L knee ROM 38-115 degrees    patient will ambulate 500 feet on all surfaces using axillary crutches independently .  Unable to ambulate currently due to pain/swelling      longTerm Goals: To be met by 8 weeks  Initial status    patient will reduce pain to 0 /10 on 0-10 pain scale to allow pain free movement.  L knee pain 8-9/10 with movement    Decrease L knee swelling to normal range  L knee swelling (see above for measurements)       ______________________________________________________________________  Pain:8-9/10 Location: L knee                        Precautions: TTWB RLE (physician instructed)    Activity Date: 04/01/16 Date:  Date: Date:    Visit # 1/16      G Code # (if needed)         CPT code  Daily treatment record Timed Coded Treatment Minutes   PT Evaluation: 97001 Eval completed, POC established 44'   Neuromuscular Re-education: 97112 -QS, HS, GS, gentle heel slides, SLR  -elevation, ice to L knees 15'    Total Treatment TIme:  12'         Pain:   L knee pain flexed  3 /10                                    Visit #: 11 / 6-12    Exercise Log:   05/12/16  v4 05/14/16  v5 05/19/16  v6 05/21/16   v7 05/26/16  v8 05/28/16  v9 06/02/16   v10 06/04/16 v11   HS supine str   W/strap  H75x2 H90x2   W/ strap  H90 x2 2 x 30 H   HS seated str   W/strap  H75x2 H90x2   H90x2    gastroc str   H60x1 H45x2 H90x1 H75x1 H90x1 2 x 30 H  HS stretch on step - - - - - H30x4     Hip flexor str on step - - - - - H30x4     Piriformis stretch        Supine 2 x 30 H   Marching           SLR's    2#   R/L  x15       LAQ's    2#  W/ ball  2x10       AP's / circles cw/ccw           GS / QS           SAQ's - - - 2#  R/L  H5x20       Squats        2 x 10 w/ ball between knees   Hip abd/add       S/Ling w/ LE on chair - unilat LE lifts / core lifts   2x10  each side    Prone hip ext R/L   2x10 R/L  x25 R/L x20 2#  R/L   2x10         knee flex R/L  x20 R/L  x20 x25 2#  R/L  x15                  bridging x20 W/ ball at knees   2x15  W/ ball  2x20  x20 x20  Easy! W/ blue theraband 2 x 10   unilat bridge R/L  x15 R/L x20 painful RL    R/L  x20 R/L  x20  easy1 2 x 10   Standing hip ext and abd        10# 2 x 10   hooklying marching - 2x15    - Marching while bridging   3x10    hooklying hip ADD w/ ball      H5x15                Prone hip ext      R/L  x10     Prone BUE lifts      R/L  x10     Prone upper body lift      H5x7     Prone BLE lifts      H5x7     Superman      H5x5     Tossing ball while standing on BOSU       2x60sec    Marching on BOSU      4x30sec in II bars needed HH for balance 4x30sec marching    Lunges on BOSU - R/L  x12 R/L  x15  R/L  x15 R/L  x15 R/L  x20    Lateral stepping on BOSU - R  X15;     L  x10 R x15;   L x5 painful  R  X20;   LLE  Not done  R/L  x15 ; better on LLE X 30   ASU on BOSU - -   R/L   x20 R/L  x20   x 30   PLANK / elbows-toes       Introduced  H5x5    Heel raises 2x10  2x15  x30      Standing 3-way hips in II bars R/L  F / E / ABD  x15 ea  F / ABD  R/L  x20 F / E /ABD  R/L  x20 F / E /  ABD  Added 2# wt  R/L  x12 --      Standing in II bars Marching in II bars  4 x10 - single HH -  Standing knee flex   2# wt  R/L   2x10 At cable column   F / E / ABD  10#    R  2x10;   L  x15      Gait in gym W/ RW  3x50'  CGA W/ RW  3x50'   SBA/A Adjusted his RW to proper height Without AD in therapy gym 3x45' Entered gym No AD  No AD today No AD today    Step negotiation Up/down 4x6 steps  Step-to pattern x2 w/ CGA x4 using B HR's  Reciprocal up; step to down  W/ SBA/S 4x6 steps  x4 using only 1 - HR / reciprocal  4x6 steps  Reciprocal pattern   Up no HR;   Down 1-HR  x5      CP / L knee declined declined declined CP / B knees  x74min CP  L knee x10 declined -    sci-fit 2.0x29min 3.5x37min XRIDE  L-8x34min XRIDE  L-8  x55min XRIDE  L-10   x95min XRIDE  L-10x87min XRIDE  L-8 x21min X Ride 8.0 x 10 min     Subjective:  Pt. Reports feeling fine today. States pain in mostly in (B) knees.  Objective:  Treatment per flow sheet   Assessment:   Pt. Demonstrated good tolerance to closed chained exercises this session without complaints.  Plan:  Planning on his RTW on 06/09/16 - planning discharge in next 5-7v - F/U with Dr Cloyde Reams 06/30/16       MINUTES of TREATMENT   Evaluation/ Re evaluation    Therapeutic Exercise 40   Therapeutic Activity    Gait Training    Manual Therapy    Iontophoresis    Ultrasound    Electrical Stimulation    Neuromuscular Rehab (balance training)    Ice/ Heat    Total Treatment Time 40

## 2016-06-05 NOTE — Progress Notes (Signed)
Contacted the patient to get his work note together for him. His voice mailbox was full and I was unable to leave a message.

## 2016-06-09 ENCOUNTER — Ambulatory Visit: Admit: 2016-06-09 | Discharge: 2016-06-09 | Payer: PRIVATE HEALTH INSURANCE

## 2016-06-09 DIAGNOSIS — R269 Unspecified abnormalities of gait and mobility: Secondary | ICD-10-CM

## 2016-06-09 NOTE — Progress Notes (Signed)
PHYSICAL THERAPY PROGRESS NOTES -      Diagnosis:   1. Gait difficulty             Referring Provider:Archdeacon, Casimiro Needle, MD    Insurance plan:   Payor/Plan Subscr DOB Sex Relation Sub. Ins. ID Effective Group Num   1. GENERIC COMME* Montelongo,Waldron S 30-Sep-1960 Male  [id number] 02/23/16 [id number]                                   [address], [place] [address]                         # of visits per insurance authorization:20  # of visits per POC: 16    Date of Initial Eval: 04/01/16    Goals of Therapy:      Assessment:    Assessment/Problem List: Patient presents with swollen/painful L knee (left lateral meniscus tear) and pelvic fracture with external fixation due to fall off ladder contributing to functional limitations including limited mobility, strength, and ROM . Patient would benefit from skilled PT to address the aforementioned deficits. Patient/family received education on the purpose of therapy, participated in the development of the POC and verbalized understanding and agreement of POC, goals.   Rehabilitation Potential: Based on this therapist's assessment, Colin [name] has the following rehabilitation potential for the PT goals stated below: : good   Plan Of Care:    Patient/Family Goals: By discharge, pt would like to:   Short Term Goals: To be met by 4 week  initial status    patient will improve L knee flexibility / ROM to full ROM in order to ambulate pain free.  L knee ROM 38-115 degrees    patient will ambulate 500 feet on all surfaces using axillary crutches independently .  Unable to ambulate currently due to pain/swelling      longTerm Goals: To be met by 8 weeks  Initial status    patient will reduce pain to 0 /10 on 0-10 pain scale to allow pain free movement.  L knee pain 8-9/10 with movement    Decrease L knee swelling to normal range  L knee swelling (see above for measurements)       ______________________________________________________________________  Pain:8-9/10 Location: L knee                         Precautions: TTWB RLE (physician instructed)    Activity Date: 04/01/16 Date:  Date: Date:    Visit # 1/16      G Code # (if needed)         CPT code  Daily treatment record Timed Coded Treatment Minutes   PT Evaluation: 97001 Eval completed, POC established 32'   Neuromuscular Re-education: 97112 -QS, HS, GS, gentle heel slides, SLR  -elevation, ice to L knees 15'    Total Treatment TIme:  50'         Pain:   L knee pain flexed 2/10; extended  2-3/10                                      Visit #: 3 / 6-12    Exercise Log:   04/10/16    v2 05/05/16   v3 05/12/16  v4  HS supine str R only H60x1 R/L  H90x2     HS seated str L only H15x2 R/L  H90x2     gastroc str Difficult d/t BLE / WB restrictions Completed during seated / supine HS w/ strap  AP's     Marching Supine  X10;   Seated  x20 Supine  x15  Seated  x20     SLR's R  X5;   L x4 w/ mod A R/L   x20     LAQ's L max A d/t pain  x5 R/L w/ ball   x20     AP's / circles cw/ccw x20  each R/L  x20ea     GS / QS H5x5 each Reminders to engage during all TE            Hip abd/add - R/L   x15     Prone hip ext - R/L  2x5 R/L   2x10      knee flex - R/L  x20 R/L  x20           bridging - - x20    unilat bridge - - R/L  x15                  Heel raises - - 2x10    Standing 3-way hips in II bars - - R/L  F / E / ABD  x15 ea    Standing in II bars - Lateral  wt-shift R/L; marching then  2x10 BLE lifts / alternating Marching in II bars  4 x10 - single HH    Gait in gym - - W/ RW  3x50'  CGA    Step negotiation - - Up/down 4x6 steps  Step-to pattern x2 w/ CGA    CP / L knee - declined declined    sci-fit - 1.0x11min 2.0x102min      Subjective:  .[name].I was sore after I worked out last week - took a couple Tylenol this morning too....  Objective:  Treatment per flow sheet - he began with sci-fit / light cardio warm-up then he demonstrated standing / closed-chain kinetics / WBing in II bars today - marching, B hip TE, heel raises - moved him onto mat therex then initiated  ambulating with RW in gym.  Introduced step negotiation in gym at [employer] he did step-to pattern using bilateral handrailings.  Declined CP at EOS.  Assessment:  GOOD tolerance to today's closed-chain challenges - he seems very hesitant at times to WB thru LLE - did well with ambulation skills and initial step negotiation.  Plan:  Continue with POC - expanding closed-chain kinetic TE - F/U with Dr Cloyde Reams end of September - MED-BEND /( MEDICAID product?)       MINUTES of TREATMENT   Evaluation/ Re evaluation    Therapeutic Exercise 57   Therapeutic Activity    Gait Training    Manual Therapy    Iontophoresis    Ultrasound    Electrical Stimulation    Neuromuscular Rehab (balance training)    Ice/ Heat    Total Treatment Time 57       PHYSICAL THERAPY PROGRESS NOTES -      Diagnosis:   1. Gait difficulty             Referring Provider:Archdeacon, Casimiro Needle, MD    Insurance plan:   Payor/Plan Subscr DOB Sex Relation Sub. Ins. ID Effective Group Num   1. GENERIC COMME* ERHARD, Colin [name] 1961-05-01 Male  [id number] 02/23/16 10378                                   [address], [place] [address]                         # of visits per insurance authorization:20  # of visits per POC: 16    Date of Initial Eval: 04/01/16    Goals of Therapy:      Assessment:    Assessment/Problem List: Patient presents with swollen/painful L knee (left lateral meniscus tear) and pelvic fracture with external fixation due to fall off ladder contributing to functional limitations including limited mobility, strength, and ROM . Patient would benefit from skilled PT to address the aforementioned deficits. Patient/family received education on the purpose of therapy, participated in the development of the POC and verbalized understanding and agreement of POC, goals.   Rehabilitation Potential: Based on this therapist's assessment, Colin [name] has the following rehabilitation potential for the PT goals stated below: : good   Plan Of Care:    Patient/Family  Goals: By discharge, pt would like to:   Short Term Goals: To be met by 4 week  initial status    patient will improve L knee flexibility / ROM to full ROM in order to ambulate pain free.  L knee ROM 38-115 degrees    patient will ambulate 500 feet on all surfaces using axillary crutches independently .  Unable to ambulate currently due to pain/swelling      longTerm Goals: To be met by 8 weeks  Initial status    patient will reduce pain to 0 /10 on 0-10 pain scale to allow pain free movement.  L knee pain 8-9/10 with movement    Decrease L knee swelling to normal range  L knee swelling (see above for measurements)       ______________________________________________________________________  Pain:8-9/10 Location: L knee                        Precautions: TTWB RLE (physician instructed)    Activity Date: 04/01/16 Date:  Date: Date:    Visit # 1/16      G Code # (if needed)         CPT code  Daily treatment record Timed Coded Treatment Minutes   PT Evaluation: 97001 Eval completed, POC established 4'   Neuromuscular Re-education: 97112 -QS, HS, GS, gentle heel slides, SLR  -elevation, ice to L knees 15'    Total Treatment TIme:  20'         Pain:   L knee pain flexed  3 /10                                    Visit #: 11 / 6-12    Exercise Log:   05/12/16  v4 05/14/16  v5 05/19/16  v6 05/21/16   v7 05/26/16  v8 05/28/16  v9 06/02/16   v10 06/04/16 v11 06/09/16   HS supine str   W/strap  H75x2 H90x2   W/ strap  H90 x2 2 x 30 H    HS seated str   W/strap  H75x2 H90x2   H90x2     gastroc str   H60x1 H45x2 H90x1 H75x1 H90x1 2  x 30 H 2 x 30 H   HS stretch on step - - - - - H30x4      Hip flexor str on step - - - - - H30x4      Piriformis stretch        Supine 2 x 30 H    Marching            SLR's    2#   R/L  x15        LAQ's    2#  W/ ball  2x10        AP's / circles cw/ccw            GS / QS            SAQ's - - - 2#  R/L  H5x20        Squats        2 x 10 w/ ball between knees Wall squat w/ ball between knees 2 x 10   Hip  abd/add       S/Ling w/ LE on chair - unilat LE lifts / core lifts   2x10 each side     Mini hop         X 10   Prone hip ext R/L   2x10 R/L  x25 R/L x20 2#  R/L   2x10          knee flex R/L  x20 R/L  x20 x25 2#  R/L  x15                    bridging x20 W/ ball at knees   2x15  W/ ball  2x20  x20 x20  Easy! W/ blue theraband 2 x 10    unilat bridge R/L  x15 R/L x20 painful RL    R/L  x20 R/L  x20  easy1 2 x 10 2 x 10  On DynaDisc   Standing hip ext and abd        10# 2 x 10 10# 2 x 10   hooklying marching - 2x15    - Marching while bridging   3x10     hooklying hip ADD w/ ball      H5x15                  Prone hip ext      R/L  x10      Prone BUE lifts      R/L  x10      Prone upper body lift      H5x7      Prone BLE lifts      H5x7      Superman      H5x5      Tossing ball while standing on BOSU       2x60sec     Marching on BOSU      4x30sec in II bars needed HH for balance 4x30sec marching     Lunges on BOSU - R/L  x12 R/L  x15  R/L  x15 R/L  x15 R/L  x20     Lateral stepping on BOSU - R  X15;     L  x10 R x15;   L x5 painful  R  X20;   LLE  Not done  R/L  x15 ; better on LLE X 30 X 30   ASU on BOSU - -   R/L   x20 R/L  x20   x 30 X 30   PLANK / elbows-toes       Introduced  H5x5     Heel raises 2x10  2x15  x30       Standing 3-way hips in II bars R/L  F / E / ABD  x15 ea  F / ABD  R/L  x20 F / E /ABD  R/L  x20 F / E / ABD  Added 2# wt  R/L  x12 --       Standing in II bars Marching in II bars  4 x10 - single HH -  Standing knee flex   2# wt  R/L   2x10 At cable column   F / E / ABD  10#    R  2x10;   L  x15       Gait in gym W/ RW  3x50'  CGA W/ RW  3x50'   SBA/A Adjusted his RW to proper height Without AD in therapy gym 3x45' Entered gym No AD  No AD today No AD today     Step negotiation Up/down 4x6 steps  Step-to pattern x2 w/ CGA x4 using B HR's  Reciprocal up; step to down  W/ SBA/S 4x6 steps  x4 using only 1 - HR / reciprocal  4x6 steps  Reciprocal pattern   Up no HR;   Down 1-HR  x5       CP / L  knee declined declined declined CP / B knees  x13min CP  L knee x10 declined -     sci-fit 2.0x19min 3.5x67min XRIDE  L-8x44min XRIDE  L-8  x28min XRIDE  L-10  x70min XRIDE  L-10x14min XRIDE  L-8 x22min X Ride 8.0 x 10 min X Ride 5.0 x 10 min     Subjective:  Pt. Reports feeling sore over the weekend. States feeling better today.   Objective:  Treatment per flow sheet   Assessment:   Pt. Demonstrated good tolerance to today's advancements without complaints.  Plan:  Planning on his RTW on 06/09/16 - planning discharge in next 5-7v - F/U with Dr Cloyde Reams 06/30/16       MINUTES of TREATMENT   Evaluation/ Re evaluation    Therapeutic Exercise 25   Therapeutic Activity    Gait Training    Manual Therapy    Iontophoresis    Ultrasound    Electrical Stimulation    Neuromuscular Rehab (balance training)    Ice/ Heat    Total Treatment Time 25

## 2016-06-11 ENCOUNTER — Ambulatory Visit: Admit: 2016-06-11 | Discharge: 2016-06-11 | Payer: PRIVATE HEALTH INSURANCE

## 2016-06-11 DIAGNOSIS — R269 Unspecified abnormalities of gait and mobility: Secondary | ICD-10-CM

## 2016-06-11 NOTE — Progress Notes (Signed)
PHYSICAL THERAPY PROGRESS NOTES -      Diagnosis:   1. Gait difficulty             Referring Provider:Archdeacon, Casimiro Needle, MD    Insurance plan:   Payor/Plan Subscr DOB Sex Relation Sub. Ins. ID Effective Group Num   1. GENERIC COMME* [name],[name] 11-13-1960 Male  [id number] 02/23/16 10378                                   [address], [place] [address]                         # of visits per insurance authorization:20  # of visits per POC: 16    Date of Initial Eval: 04/01/16    Goals of Therapy:      Assessment:    Assessment/Problem List: Patient presents with swollen/painful L knee (left lateral meniscus tear) and pelvic fracture with external fixation due to fall off ladder contributing to functional limitations including limited mobility, strength, and ROM . Patient would benefit from skilled PT to address the aforementioned deficits. Patient/family received education on the purpose of therapy, participated in the development of the POC and verbalized understanding and agreement of POC, goals.   Rehabilitation Potential: Based on this therapist's assessment, Colin [name] has the following rehabilitation potential for the PT goals stated below: : good   Plan Of Care:    Patient/Family Goals: By discharge, pt would like to:   Short Term Goals: To be met by 4 week  initial status    patient will improve L knee flexibility / ROM to full ROM in order to ambulate pain free.  L knee ROM 38-115 degrees    patient will ambulate 500 feet on all surfaces using axillary crutches independently .  Unable to ambulate currently due to pain/swelling      longTerm Goals: To be met by 8 weeks  Initial status    patient will reduce pain to 0 /10 on 0-10 pain scale to allow pain free movement.  L knee pain 8-9/10 with movement    Decrease L knee swelling to normal range  L knee swelling (see above for measurements)       ______________________________________________________________________  Pain:8-9/10 Location: L knee                         Precautions: TTWB RLE (physician instructed)    Activity Date: 04/01/16 Date:  Date: Date:    Visit # 1/16      G Code # (if needed)         CPT code  Daily treatment record Timed Coded Treatment Minutes   PT Evaluation: 97001 Eval completed, POC established 72'   Neuromuscular Re-education: 97112 -QS, HS, GS, gentle heel slides, SLR  -elevation, ice to L knees 15'    Total Treatment TIme:  87'         Pain:   L knee pain flexed 2/10; extended  2-3/10                                      Visit #: 3 / 6-12    Exercise Log:   04/10/16    v2 05/05/16   v3 05/12/16  v4  HS supine str R only H60x1 R/L  H90x2     HS seated str L only H15x2 R/L  H90x2     gastroc str Difficult d/t BLE / WB restrictions Completed during seated / supine HS w/ strap  AP's     Marching Supine  X10;   Seated  x20 Supine  x15  Seated  x20     SLR's R  X5;   L x4 w/ mod A R/L   x20     LAQ's L max A d/t pain  x5 R/L w/ ball   x20     AP's / circles cw/ccw x20  each R/L  x20ea     GS / QS H5x5 each Reminders to engage during all TE            Hip abd/add - R/L   x15     Prone hip ext - R/L  2x5 R/L   2x10      knee flex - R/L  x20 R/L  x20           bridging - - x20    unilat bridge - - R/L  x15                  Heel raises - - 2x10    Standing 3-way hips in II bars - - R/L  F / E / ABD  x15 ea    Standing in II bars - Lateral  wt-shift R/L; marching then  2x10 BLE lifts / alternating Marching in II bars  4 x10 - single HH    Gait in gym - - W/ RW  3x50'  CGA    Step negotiation - - Up/down 4x6 steps  Step-to pattern x2 w/ CGA    CP / L knee - declined declined    sci-fit - 1.0x34min 2.0x78min      Subjective:  .[name].I was sore after I worked out last week - took a couple Tylenol this morning too....  Objective:  Treatment per flow sheet - he began with sci-fit / light cardio warm-up then he demonstrated standing / closed-chain kinetics / WBing in II bars today - marching, B hip TE, heel raises - moved him onto mat therex then initiated  ambulating with RW in gym.  Introduced step negotiation in gym at [employer] he did step-to pattern using bilateral handrailings.  Declined CP at EOS.  Assessment:  GOOD tolerance to today's closed-chain challenges - he seems very hesitant at times to WB thru LLE - did well with ambulation skills and initial step negotiation.  Plan:  Continue with POC - expanding closed-chain kinetic TE - F/U with Dr Cloyde Reams end of September - MED-BEND /( MEDICAID product?)       MINUTES of TREATMENT   Evaluation/ Re evaluation    Therapeutic Exercise 57   Therapeutic Activity    Gait Training    Manual Therapy    Iontophoresis    Ultrasound    Electrical Stimulation    Neuromuscular Rehab (balance training)    Ice/ Heat    Total Treatment Time 57       PHYSICAL THERAPY PROGRESS NOTES -      Diagnosis:   1. Gait difficulty             Referring Provider:Archdeacon, Casimiro Needle, MD    Insurance plan:   Payor/Plan Subscr DOB Sex Relation Sub. Ins. ID Effective Group Num   1. GENERIC COMME* Colin [name], Colin [name] 1961-07-26 Male  [id number] 02/23/16 10378                                   [address], [place] [address]                         # of visits per insurance authorization:20  # of visits per POC: 16    Date of Initial Eval: 04/01/16    Goals of Therapy:      Assessment:    Assessment/Problem List: Patient presents with swollen/painful L knee (left lateral meniscus tear) and pelvic fracture with external fixation due to fall off ladder contributing to functional limitations including limited mobility, strength, and ROM . Patient would benefit from skilled PT to address the aforementioned deficits. Patient/family received education on the purpose of therapy, participated in the development of the POC and verbalized understanding and agreement of POC, goals.   Rehabilitation Potential: Based on this therapist's assessment, Colin [name] has the following rehabilitation potential for the PT goals stated below: : good   Plan Of Care:    Patient/Family  Goals: By discharge, pt would like to:   Short Term Goals: To be met by 4 week  initial status    patient will improve L knee flexibility / ROM to full ROM in order to ambulate pain free.  L knee ROM 38-115 degrees    patient will ambulate 500 feet on all surfaces using axillary crutches independently .  Unable to ambulate currently due to pain/swelling      longTerm Goals: To be met by 8 weeks  Initial status    patient will reduce pain to 0 /10 on 0-10 pain scale to allow pain free movement.  L knee pain 8-9/10 with movement    Decrease L knee swelling to normal range  L knee swelling (see above for measurements)       ______________________________________________________________________  Pain:8-9/10 Location: L knee                        Precautions: TTWB RLE (physician instructed)    Activity Date: 04/01/16 Date:  Date: Date:    Visit # 1/16      G Code # (if needed)         CPT code  Daily treatment record Timed Coded Treatment Minutes   PT Evaluation: 97001 Eval completed, POC established 42'   Neuromuscular Re-education: 97112 -QS, HS, GS, gentle heel slides, SLR  -elevation, ice to L knees 15'    Total Treatment TIme:  63'         Pain:   L knee pain flexed  2/10                                    Visit #: 13 / 6-12    Exercise Log:   05/12/16  v4 05/14/16  v5 05/19/16  v6 05/21/16   v7 05/26/16  v8 05/28/16  v9 06/02/16   v10 06/04/16 v11 06/09/16 06/11/16 v13   HS supine str   W/strap  H75x2 H90x2   W/ strap  H90 x2 2 x 30 H     HS seated str   W/strap  H75x2 H90x2   H90x2      gastroc str   H60x1 H45x2 H90x1  H75x1 H90x1 2 x 30 H 2 x 30 H 2 x 30 H   HS stretch on step - - - - - H30x4       Hip flexor str on step - - - - - H30x4       Piriformis stretch        Supine 2 x 30 H  Supine 2 x 30 H   Marching             SLR's    2#   R/L  x15         LAQ's    2#  W/ ball  2x10         AP's / circles cw/ccw             GS / QS             SAQ's - - - 2#  R/L  H5x20         Squats        2 x 10 w/ ball between knees Wall  squat w/ ball between knees 2 x 10    Hip abd/add       S/Ling w/ LE on chair - unilat LE lifts / core lifts   2x10 each side      Mini hop         X 10    Prone hip ext R/L   2x10 R/L  x25 R/L x20 2#  R/L   2x10           knee flex R/L  x20 R/L  x20 x25 2#  R/L  x15                      bridging x20 W/ ball at knees   2x15  W/ ball  2x20  x20 x20  Easy! W/ blue theraband 2 x 10  On dyna disc 3 x 10   unilat bridge R/L  x15 R/L x20 painful RL    R/L  x20 R/L  x20  easy1 2 x 10 2 x 10  On DynaDisc 2 x 10   Standing hip ext and abd        10# 2 x 10 10# 2 x 10    hooklying marching - 2x15    - Marching while bridging   3x10      hooklying hip ADD w/ ball      H5x15                    Prone hip ext      R/L  x10       Prone BUE lifts      R/L  x10       Prone upper body lift      H5x7       Prone BLE lifts      H5x7       Superman      H5x5       Tossing ball while standing on BOSU       2x60sec      Marching on BOSU      4x30sec in II bars needed HH for balance 4x30sec marching      Lunges on BOSU - R/L  x12 R/L  x15  R/L  x15 R/L  x15 R/L  x20      Lateral stepping on BOSU - R  X15;  L  x10 R x15;   L x5 painful  R  X20;   LLE  Not done  R/L  x15 ; better on LLE X 30 X 30 X 30   ASU on BOSU - -   R/L   x20 R/L  x20   x 30 X 30 X 30   PLANK / elbows-toes       Introduced  H5x5      Heel raises 2x10  2x15  x30        Standing 3-way hips in II bars R/L  F / E / ABD  x15 ea  F / ABD  R/L  x20 F / E /ABD  R/L  x20 F / E / ABD  Added 2# wt  R/L  x12 --        Standing in II bars Marching in II bars  4 x10 - single HH -  Standing knee flex   2# wt  R/L   2x10 At cable column   F / E / ABD  10#    R  2x10;   L  x15        Gait in gym W/ RW  3x50'  CGA W/ RW  3x50'   SBA/A Adjusted his RW to proper height Without AD in therapy gym 3x45' Entered gym No AD  No AD today No AD today      Step negotiation Up/down 4x6 steps  Step-to pattern x2 w/ CGA x4 using B HR's  Reciprocal up; step to down  W/ SBA/S 4x6 steps  x4 using  only 1 - HR / reciprocal  4x6 steps  Reciprocal pattern   Up no HR;   Down 1-HR  x5        CP / L knee declined declined declined CP / B knees  x25min CP  L knee x10 declined -      sci-fit 2.0x70min 3.5x25min XRIDE  L-8x48min XRIDE  L-8  x32min XRIDE  L-10  x87min XRIDE  L-10x30min XRIDE  L-8 x47min X Ride 8.0 x 10 min X Ride 5.0 x 10 min      Subjective:  Pt. Reports pain today is a 2/10.  Objective:  Treatment per flow sheet   Assessment:   Pt. Demonstrated good tolerance to closed chained TherEx this visit without complaints.  Plan:  Planning on his RTW on 06/09/16 - planning discharge in next 5-7v - F/U with Dr Cloyde Reams 06/30/16       MINUTES of TREATMENT   Evaluation/ Re evaluation    Therapeutic Exercise 25   Therapeutic Activity    Gait Training    Manual Therapy    Iontophoresis    Ultrasound    Electrical Stimulation    Neuromuscular Rehab (balance training)    Ice/ Heat    Total Treatment Time 25

## 2016-06-13 NOTE — Progress Notes (Signed)
Per patients request faxed return to work note to [facility]. Faxed it to [phone], confirmation of fax receipt received at 3:18 pm.

## 2016-06-16 ENCOUNTER — Ambulatory Visit: Admit: 2016-06-16 | Discharge: 2016-06-16 | Payer: PRIVATE HEALTH INSURANCE

## 2016-06-16 DIAGNOSIS — R269 Unspecified abnormalities of gait and mobility: Secondary | ICD-10-CM

## 2016-06-16 NOTE — Unmapped (Signed)
PHYSICAL THERAPY PROGRESS NOTES -      Diagnosis:   1. Gait difficulty     2. Peripheral tear of lateral meniscus of left knee, unspecified whether old or current tear, subsequent encounter             Referring Provider:Archdeacon, Casimiro Needle, MD    Insurance plan:   Payor/Plan Subscr DOB Sex Relation Sub. Ins. ID Effective Group Num   1. GENERIC COMME* [name],[name] 05-Nov-1960 Male  [id number] 02/23/16 [id number]                                   [address], [place] [address]                         # of visits per insurance authorization:20  # of visits per POC: 16    Date of Initial Eval: 04/01/16    Goals of Therapy:      Assessment:    Assessment/Problem List: Patient presents with swollen/painful L knee (left lateral meniscus tear) and pelvic fracture with external fixation due to fall off ladder contributing to functional limitations including limited mobility, strength, and ROM . Patient would benefit from skilled PT to address the aforementioned deficits. Patient/family received education on the purpose of therapy, participated in the development of the POC and verbalized understanding and agreement of POC, goals.   Rehabilitation Potential: Based on this therapist's assessment, Colin [name] has the following rehabilitation potential for the PT goals stated below: : good   Plan Of Care:    Patient/Family Goals: By discharge, pt would like to:   Short Term Goals: To be met by 4 week  initial status    patient will improve L knee flexibility / ROM to full ROM in order to ambulate pain free.  L knee ROM 38-115 degrees    patient will ambulate 500 feet on all surfaces using axillary crutches independently .  Unable to ambulate currently due to pain/swelling      longTerm Goals: To be met by 8 weeks  Initial status    patient will reduce pain to 0 /10 on 0-10 pain scale to allow pain free movement.  L knee pain 8-9/10 with movement    Decrease L knee swelling to normal range  L knee swelling (see above for measurements)        ______________________________________________________________________  Pain:8-9/10 Location: L knee                        Precautions: TTWB RLE (physician instructed)    Activity Date: 04/01/16 Date:  Date: Date:    Visit # 1/16      G Code # (if needed)         CPT code  Daily treatment record Timed Coded Treatment Minutes   PT Evaluation: 97001 Eval completed, POC established 100'   Neuromuscular Re-education: 97112 -QS, HS, GS, gentle heel slides, SLR  -elevation, ice to L knees 15'    Total Treatment TIme:  63'         Pain:   L knee pain flexed 2/10; extended  2-3/10                                      Visit #: 3 / 6-12  Exercise Log:   04/10/16    v2 05/05/16   v3 05/12/16  v4    HS supine str R only H60x1 R/L  H90x2     HS seated str L only H15x2 R/L  H90x2     gastroc str Difficult d/t BLE / WB restrictions Completed during seated / supine HS w/ strap  AP's     Marching Supine  X10;   Seated  x20 Supine  x15  Seated  x20     SLR's R  X5;   L x4 w/ mod A R/L   x20     LAQ's L max A d/t pain  x5 R/L w/ ball   x20     AP's / circles cw/ccw x20  each R/L  x20ea     GS / QS H5x5 each Reminders to engage during all TE            Hip abd/add - R/L   x15     Prone hip ext - R/L  2x5 R/L   2x10      knee flex - R/L  x20 R/L  x20           bridging - - x20    unilat bridge - - R/L  x15                  Heel raises - - 2x10    Standing 3-way hips in II bars - - R/L  F / E / ABD  x15 ea    Standing in II bars - Lateral  wt-shift R/L; marching then  2x10 BLE lifts / alternating Marching in II bars  4 x10 - single HH    Gait in gym - - W/ RW  3x50'  CGA    Step negotiation - - Up/down 4x6 steps  Step-to pattern x2 w/ CGA    CP / L knee - declined declined    sci-fit - 1.0x47min 2.0x50min      Subjective:  .[name].I was sore after I worked out last week - took a couple Tylenol this morning too....  Objective:  Treatment per flow sheet - he began with sci-fit / light cardio warm-up then he demonstrated standing /  closed-chain kinetics / WBing in II bars today - marching, B hip TE, heel raises - moved him onto mat therex then initiated ambulating with RW in gym.  Introduced step negotiation in gym at [employer] he did step-to pattern using bilateral handrailings.  Declined CP at EOS.  Assessment:  GOOD tolerance to today's closed-chain challenges - he seems very hesitant at times to WB thru LLE - did well with ambulation skills and initial step negotiation.  Plan:  Continue with POC - expanding closed-chain kinetic TE - F/U with Dr Cloyde Reams end of September - MED-BEND /( MEDICAID product?)       MINUTES of TREATMENT   Evaluation/ Re evaluation    Therapeutic Exercise 57   Therapeutic Activity    Gait Training    Manual Therapy    Iontophoresis    Ultrasound    Electrical Stimulation    Neuromuscular Rehab (balance training)    Ice/ Heat    Total Treatment Time 57       PHYSICAL THERAPY PROGRESS NOTES -      Diagnosis:   1. Gait difficulty     2. Peripheral tear of lateral meniscus of left knee, unspecified whether old or current tear, subsequent encounter  Referring Provider:Archdeacon, Casimiro Needle, MD    Insurance plan:   Payor/Plan Subscr DOB Sex Relation Sub. Ins. ID Effective Group Num   1. GENERIC COMME* [name],[name] 14-Sep-1960 Male  [id number] 02/23/16 [id number]                                   [address], [place] [address]                         # of visits per insurance authorization:20  # of visits per POC: 16    Date of Initial Eval: 04/01/16    Goals of Therapy:      Assessment:    Assessment/Problem List: Patient presents with swollen/painful L knee (left lateral meniscus tear) and pelvic fracture with external fixation due to fall off ladder contributing to functional limitations including limited mobility, strength, and ROM . Patient would benefit from skilled PT to address the aforementioned deficits. Patient/family received education on the purpose of therapy, participated in the development of the POC and  verbalized understanding and agreement of POC, goals.   Rehabilitation Potential: Based on this therapist's assessment, Colin [name] has the following rehabilitation potential for the PT goals stated below: : good   Plan Of Care:    Patient/Family Goals: By discharge, pt would like to:   Short Term Goals: To be met by 4 week  initial status    patient will improve L knee flexibility / ROM to full ROM in order to ambulate pain free.  L knee ROM 38-115 degrees    patient will ambulate 500 feet on all surfaces using axillary crutches independently .  Unable to ambulate currently due to pain/swelling      longTerm Goals: To be met by 8 weeks  Initial status    patient will reduce pain to 0 /10 on 0-10 pain scale to allow pain free movement.  L knee pain 8-9/10 with movement    Decrease L knee swelling to normal range  L knee swelling (see above for measurements)       ______________________________________________________________________  Pain:8-9/10 Location: L knee                        Precautions: TTWB RLE (physician instructed)    Activity Date: 04/01/16 Date:  Date: Date:    Visit # 1/16      G Code # (if needed)         CPT code  Daily treatment record Timed Coded Treatment Minutes   PT Evaluation: 97001 Eval completed, POC established 51'   Neuromuscular Re-education: 97112 -QS, HS, GS, gentle heel slides, SLR  -elevation, ice to L knees 15'    Total Treatment TIme:  18'         Pain:   L knee pain flexed  2/10                                    Visit #: 14 / 6-12    Exercise Log:   05/28/16  v9 06/02/16   v10 06/04/16 v11 06/09/16 06/11/16 v13 06/16/16   v14   HS supine str  W/ strap  H90 x2 2 x 30 H      HS seated str  H90x2  gastroc str H75x1 H90x1 2 x 30 H 2 x 30 H 2 x 30 H H60x1   HS stretch on step H30x4        Hip flexor str on step H30x4        Piriformis stretch   Supine 2 x 30 H  Supine 2 x 30 H    Marching         SLR's         LAQ's         AP's / circles cw/ccw         GS / QS         SAQ's          Squats   2 x 10 w/ ball between knees Wall squat w/ ball between knees 2 x 10     Hip abd/add  S/Ling w/ LE on chair - unilat LE lifts / core lifts   2x10 each side    S/Ling w/ LE on chair  unilat LE lifts / core lifts  2x10 ea   Mini hop    X 10     Prone hip ext           knee flex                  LE lift w/ LE's over gymnic ball      Alternating R/L  4x5            bridging x20 x20  Easy! W/ blue theraband 2 x 10  On dyna disc 3 x 10 LE's over gymnic ball  2x12   unilat bridge R/L  x20 R/L  x20  easy1 2 x 10 2 x 10  On DynaDisc 2 x 10    Standing hip ext and abd   10# 2 x 10 10# 2 x 10  Cab;e column  BLE  F / E / ABD  15#   x20   hooklying marching - Marching while bridging   3x10       hooklying hip ADD w/ ball H5x15     H5x15   Supine w/ ball at feet adductor squeeze      H5x12   Prone hip ext R/L  x10        Prone BUE lifts R/L  x10        Prone upper body lift H5x7        Prone BLE lifts H5x7        Superman H5x5        Tossing ball while standing on BOSU  2x60sec       Marching on BOSU 4x30sec in II bars needed HH for balance 4x30sec marching       Lunges on BOSU R/L  x15 R/L  x20       Lateral stepping on BOSU  R/L  x15 ; better on LLE X 30 X 30 X 30    ASU on BOSU R/L  x20   x 30 X 30 X 30    PLANK / elbows-toes  Introduced  H5x5       Heel raises         Standing 3-way hips in II bars         Standing in II bars         Gait in gym No AD today No AD today       Step negotiation  CP / L knee declined -       sci-fit XRIDE  L-10x79min XRIDE  L-8 x75min X Ride 8.0 x 10 min X Ride 5.0 x 10 min  XRIDE  L-10  x11min     Subjective:   .Marland KitchenMarland KitchenMarland KitchenI start back to work today - full - time and full duty - no half way for me....  Objective:  Treatment per flow sheet - warj-up on XRIDE - BLE flexibility - closed-chain BLE strengthening TE standing at cable column - core TE utilizing green gymnic ball / chair (add this to HEP) as well as isometric adductor BLE TE in supine - he demo'd and voiced understanding  their importance.  PT spoke w/ patient at length concerning taking it easy at work as today is first day back - anticipating he may be sore - recommended he take pain meds as prescribed for comfort.  Assessment:  He seems to be responding well to therapeutic interventions - will see how he feels after he works full days - may need to adjust his hours accordingly.  He continues to exhibit RLE / adductor weakness / improving though and doesn't appear to negatively effect his functional capabilities.  Plan:  Planning discharge in next 2-3v - F/U with Dr Cloyde Reams 06/30/16       MINUTES of TREATMENT   Evaluation/ Re evaluation    Therapeutic Exercise 53   Therapeutic Activity    Gait Training    Manual Therapy    Iontophoresis    Ultrasound    Electrical Stimulation    Neuromuscular Rehab (balance training)    Ice/ Heat    Total Treatment Time 53

## 2016-06-23 ENCOUNTER — Ambulatory Visit: Admit: 2016-06-23 | Discharge: 2016-06-23 | Payer: PRIVATE HEALTH INSURANCE

## 2016-06-23 DIAGNOSIS — R269 Unspecified abnormalities of gait and mobility: Secondary | ICD-10-CM

## 2016-06-23 NOTE — Unmapped (Signed)
PHYSICAL THERAPY PROGRESS NOTES       Diagnosis:   1. Gait difficulty             Referring Provider:Archdeacon, Casimiro Needle, MD    Insurance plan:   Payor/Plan Subscr DOB Sex Relation Sub. Ins. ID Effective Group Num   1. GENERIC COMME* [name],[name] June 04, 1961 Male  [id number] 02/23/16 [id number]                                   [address], [place] [address]                         # of visits per insurance authorization:20  # of visits per POC: 16    Date of Initial Eval: 04/01/16    Goals of Therapy:      Assessment:    Assessment/Problem List: Patient presents with swollen/painful L knee (left lateral meniscus tear) and pelvic fracture with external fixation due to fall off ladder contributing to functional limitations including limited mobility, strength, and ROM . Patient would benefit from skilled PT to address the aforementioned deficits. Patient/family received education on the purpose of therapy, participated in the development of the POC and verbalized understanding and agreement of POC, goals.   Rehabilitation Potential: Based on this therapist's assessment, Colin [name] has the following rehabilitation potential for the PT goals stated below : good   Plan Of Care:    Patient/Family Goals: By discharge, pt would like to:   Short Term Goals: To be met by 4 week  initial status    patient will improve L knee flexibility / ROM to full ROM in order to ambulate pain free.  L knee ROM 38-115 degrees    patient will ambulate 500 feet on all surfaces using axillary crutches independently .  Unable to ambulate currently due to pain/swelling      longTerm Goals: To be met by 8 weeks  Initial status    patient will reduce pain to 0 /10 on 0-10 pain scale to allow pain free movement.  L knee pain 8-9/10 with movement    Decrease L knee swelling to normal range  L knee swelling (see above for measurements)       ______________________________________________________________________  Pain:8-9/10 Location: L knee                         Precautions: TTWB RLE (physician instructed)    Activity Date: 04/01/16 Date:  Date: Date:    Visit # 1/16      G Code # (if needed)         CPT code  Daily treatment record Timed Coded Treatment Minutes   PT Evaluation: 97001 Eval completed, POC established 52'   Neuromuscular Re-education: 97112 -QS, HS, GS, gentle heel slides, SLR  -elevation, ice to L knees 15'    Total Treatment TIme:  72'         Pain:   L knee pain flexed  2 /10                                    Visit #: 15 / 6-12    Exercise Log:   05/28/16  v9 06/02/16   v10 06/04/16 v11 06/09/16 06/11/16 v13 06/16/16   v14 06/23/16  v15   HS supine str  W/ strap  H90 x2 2 x 30 H       HS seated str  H90x2        gastroc str H75x1 H90x1 2 x 30 H 2 x 30 H 2 x 30 H H60x1 H90x1   HS stretch on step H30x4      R/L  H45x2   Hip flexor str on step H30x4         Piriformis stretch   Supine 2 x 30 H  Supine 2 x 30 H     Marching          SLR's          LAQ's          AP's / circles cw/ccw          GS / QS          SAQ's          Squats   2 x 10 w/ ball between knees Wall squat w/ ball between knees 2 x 10      Hip abd/add  S/Ling w/ LE on chair - unilat LE lifts / core lifts   2x10 each side    S/Ling w/ LE on chair  unilat LE lifts / core lifts  2x10 ea    Mini hop    X 10      Prone hip ext            knee flex                    LE lift w/ LE's over gymnic ball      Alternating R/L  4x5              bridging x20 x20  Easy! W/ blue theraband 2 x 10  On dyna disc 3 x 10 LE's over gymnic ball  2x12    unilat bridge R/L  x20 R/L  x20  easy1 2 x 10 2 x 10  On DynaDisc 2 x 10     Standing hip ext and abd   10# 2 x 10 10# 2 x 10  Cab;e column  BLE  F / E / ABD  15#   x20    hooklying marching - Marching while bridging   3x10        hooklying hip ADD w/ ball H5x15     H5x15    Supine w/ ball at feet adductor squeeze      H5x12    Prone hip ext R/L  x10         Prone BUE lifts R/L  x10         Prone upper body lift H5x7         Prone BLE lifts H5x7          Superman H5x5         Tossing ball while standing on BOSU  2x60sec        Marching on BOSU 4x30sec in II bars needed HH for balance 4x30sec marching     3x30sec   Lunges on BOSU R/L  x15 R/L  x20     R/L  x20   Lateral stepping on BOSU  R/L  x15 ; better on LLE X 30 X 30 X 30  R/L  x20   ASU on BOSU R/L  x20   x 30 X 30 X 30  R/L  x20  PLANK / elbows-toes  Introduced  H5x5        Heel raises          Standing 3-way hips in II bars          Standing in II bars          Gait in gym No AD today No AD today     Normal gait pattern now - no AD   Step negotiation       Up/down full flight x12 steps no HR w/o difficulty   CP / L knee declined -        sci-fit XRIDE  L-10x44min XRIDE  L-8 x47min X Ride 8.0 x 10 min X Ride 5.0 x 10 min  XRIDE  L-10  x35min sci-fit  5.0x45min     Subjective:   .Marland KitchenMarland KitchenMarland KitchenI've been doing really good - I'm tired out from being back to work full-time but I'm feeling good - except my L knee sometimes....  Objective:  Treatment per flow sheet - he was able to demonstrate small portion of his therapeutic regimen -  He voiced understanding the importance of maintaining his HEP compliancy.  He declined any ice to L knee citing wanting to get back to work today.  Assessment:  GOOD tolerance and improvement / responding positively to therapeutic interventions  Plan:  Planning discharge next visit - F/U with Dr Cloyde Reams 06/30/16       MINUTES of TREATMENT   Evaluation/ Re evaluation    Therapeutic Exercise 28   Therapeutic Activity    Gait Training    Manual Therapy    Iontophoresis    Ultrasound    Electrical Stimulation    Neuromuscular Rehab (balance training)    Ice/ Heat    Total Treatment Time 28

## 2016-06-25 ENCOUNTER — Encounter

## 2016-06-25 NOTE — Progress Notes (Signed)
Physical Therapy Non-Visit Discharge Summary    Patient Name: Colin [name]    NG#:[id number]   DOB: 1960/11/14  Referring Physician:  Andee Lineman, MD - ORTHO   Phone: N/A Fax:   Date: 06/25/2016    Diagnosis:  Impaired functional mobility, balance, gait and endurance - s/p pelvic fx - surgical intervention    # of visits completed: 15   # cancels/ no shows: 3    Colin [name]  was unable to complete recommended plan of care due to:[]  non-compliance []  medical complication []  lack of transportation [x]  symptoms/ condition resolved [x]  other:  He has returned to work as of last week - full-time    Current status toward goals is unable to be determined at this time due to missed final appointment. Refer to last progress note dated 06/23/16 for latest status toward goals. Colin [name] will be discharged at this time. Colin [name] will require an updated prescription/ referral to resume therapy.    Physical Therapist Signature:  Clent Demark, MSPT         Date:  06/25/2016

## 2016-06-30 ENCOUNTER — Ambulatory Visit: Admit: 2016-06-30 | Discharge: 2016-06-30 | Payer: PRIVATE HEALTH INSURANCE

## 2016-06-30 ENCOUNTER — Inpatient Hospital Stay: Admit: 2016-06-30 | Payer: PRIVATE HEALTH INSURANCE

## 2016-06-30 DIAGNOSIS — S3282XD Multiple fractures of pelvis without disruption of pelvic ring, subsequent encounter for fracture with routine healing: Secondary | ICD-10-CM

## 2016-06-30 DIAGNOSIS — S3210XD Unspecified fracture of sacrum, subsequent encounter for fracture with routine healing: Secondary | ICD-10-CM

## 2016-06-30 NOTE — Unmapped (Signed)
This office note has been dictated.

## 2016-06-30 NOTE — Unmapped (Signed)
[facility]     PATIENT NAME:  PETERSON, MATHEY                  MRN:  [id number]  DATE OF BIRTH:  [birth year]                       CSN:  [id number]  PROVIDER:  Loney Laurence, P.A.                  VISIT DATE:  06/30/2016                                       OFFICE NOTE     REASON FOR VISIT:  Follow-up pelvic injury.     HISTORY:  Mr. Reichardt returns today.  He is over 4 months out from a right LC  type 1 injury requiring 2 SI screws and pelvic external fixator.  The patient  had his external fixator removed in September. 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  He is doing quite well.  Is able to tolerate all activities at this point.  The patient though continues to note some right testicular and penile pain.  He also notices that when he needs to urinate he just has some fullness and a  little bit of discomfort and then once he is able to relieve himself that  pressure diminishes.  Other than these complaints though he is doing really  well.  Has no other issues.     PHYSICAL EXAM:  The patient is seen in the exam room.  He is able to arise  from a seated position independently.  He is able to ambulate without any  evidence of antalgia.  He has normal gait.  The patient does not have any  pain with internal/external rotation of the hip nor with flexion and  extension.  He is nontender to palpation or lateral compression of the  pelvis.  Sensation is grossly normal of the lower extremity.     ASSESSMENT:  1. Pelvic ring injury LC type 1 with retained right SI screws.  2. Right testicular and penile pain.     PLAN:  The patient is going to be referred to Urology for evaluation of his  testicular, penile and scrotal complaints.  He is going to follow-up with up  on an as needed basis.  Just continue activities as tolerated.                                               Loney Laurence, P.A.  KZ/sar  D:  06/30/2016 09:46  T:  07/01/2016 13:48  Job #:  [id number]           OFFICE NOTE                                                  PAGE  1 of   1

## 2016-07-04 DIAGNOSIS — E1165 Type 2 diabetes mellitus with hyperglycemia: Secondary | ICD-10-CM | POA: Diagnosis not present

## 2016-07-04 DIAGNOSIS — K219 Gastro-esophageal reflux disease without esophagitis: Secondary | ICD-10-CM | POA: Diagnosis not present

## 2016-07-04 DIAGNOSIS — I10 Essential (primary) hypertension: Secondary | ICD-10-CM | POA: Diagnosis not present

## 2016-07-04 DIAGNOSIS — R748 Abnormal levels of other serum enzymes: Secondary | ICD-10-CM | POA: Diagnosis not present

## 2016-07-08 DIAGNOSIS — Z6841 Body Mass Index (BMI) 40.0 and over, adult: Secondary | ICD-10-CM | POA: Diagnosis not present

## 2016-07-08 DIAGNOSIS — I10 Essential (primary) hypertension: Secondary | ICD-10-CM | POA: Diagnosis not present

## 2016-07-08 DIAGNOSIS — M545 Low back pain: Secondary | ICD-10-CM | POA: Diagnosis not present

## 2016-07-08 DIAGNOSIS — K219 Gastro-esophageal reflux disease without esophagitis: Secondary | ICD-10-CM | POA: Diagnosis not present

## 2016-07-08 DIAGNOSIS — F419 Anxiety disorder, unspecified: Secondary | ICD-10-CM | POA: Diagnosis not present

## 2016-07-08 DIAGNOSIS — E1165 Type 2 diabetes mellitus with hyperglycemia: Secondary | ICD-10-CM | POA: Diagnosis not present

## 2016-10-06 DIAGNOSIS — I10 Essential (primary) hypertension: Secondary | ICD-10-CM | POA: Diagnosis not present

## 2016-10-06 DIAGNOSIS — E1165 Type 2 diabetes mellitus with hyperglycemia: Secondary | ICD-10-CM | POA: Diagnosis not present

## 2016-10-06 DIAGNOSIS — K219 Gastro-esophageal reflux disease without esophagitis: Secondary | ICD-10-CM | POA: Diagnosis not present

## 2016-10-06 DIAGNOSIS — R748 Abnormal levels of other serum enzymes: Secondary | ICD-10-CM | POA: Diagnosis not present

## 2016-10-06 DIAGNOSIS — F419 Anxiety disorder, unspecified: Secondary | ICD-10-CM | POA: Diagnosis not present

## 2016-10-06 DIAGNOSIS — Z205 Contact with and (suspected) exposure to viral hepatitis: Secondary | ICD-10-CM | POA: Diagnosis not present

## 2016-10-08 DIAGNOSIS — F419 Anxiety disorder, unspecified: Secondary | ICD-10-CM | POA: Diagnosis not present

## 2016-10-08 DIAGNOSIS — M545 Low back pain: Secondary | ICD-10-CM | POA: Diagnosis not present

## 2016-10-08 DIAGNOSIS — Z6841 Body Mass Index (BMI) 40.0 and over, adult: Secondary | ICD-10-CM | POA: Diagnosis not present

## 2016-10-08 DIAGNOSIS — K219 Gastro-esophageal reflux disease without esophagitis: Secondary | ICD-10-CM | POA: Diagnosis not present

## 2016-10-08 DIAGNOSIS — I10 Essential (primary) hypertension: Secondary | ICD-10-CM | POA: Diagnosis not present

## 2016-10-08 DIAGNOSIS — Z1389 Encounter for screening for other disorder: Secondary | ICD-10-CM | POA: Diagnosis not present

## 2016-10-08 DIAGNOSIS — E1165 Type 2 diabetes mellitus with hyperglycemia: Secondary | ICD-10-CM | POA: Diagnosis not present

## 2017-01-08 ENCOUNTER — Inpatient Hospital Stay: Admit: 2017-01-08

## 2017-01-08 DIAGNOSIS — K219 Gastro-esophageal reflux disease without esophagitis: Secondary | ICD-10-CM | POA: Diagnosis not present

## 2017-01-08 DIAGNOSIS — E1165 Type 2 diabetes mellitus with hyperglycemia: Secondary | ICD-10-CM | POA: Diagnosis not present

## 2017-01-08 DIAGNOSIS — I10 Essential (primary) hypertension: Secondary | ICD-10-CM | POA: Diagnosis not present

## 2017-01-08 DIAGNOSIS — R748 Abnormal levels of other serum enzymes: Secondary | ICD-10-CM | POA: Diagnosis not present

## 2017-01-08 DIAGNOSIS — F419 Anxiety disorder, unspecified: Secondary | ICD-10-CM | POA: Diagnosis not present

## 2017-01-12 DIAGNOSIS — M545 Low back pain: Secondary | ICD-10-CM | POA: Diagnosis not present

## 2017-01-12 DIAGNOSIS — Z6841 Body Mass Index (BMI) 40.0 and over, adult: Secondary | ICD-10-CM | POA: Diagnosis not present

## 2017-01-12 DIAGNOSIS — I10 Essential (primary) hypertension: Secondary | ICD-10-CM | POA: Diagnosis not present

## 2017-01-12 DIAGNOSIS — E1165 Type 2 diabetes mellitus with hyperglycemia: Secondary | ICD-10-CM | POA: Diagnosis not present

## 2017-01-12 DIAGNOSIS — F419 Anxiety disorder, unspecified: Secondary | ICD-10-CM | POA: Diagnosis not present

## 2017-01-12 DIAGNOSIS — K219 Gastro-esophageal reflux disease without esophagitis: Secondary | ICD-10-CM | POA: Diagnosis not present

## 2017-04-10 DIAGNOSIS — R748 Abnormal levels of other serum enzymes: Secondary | ICD-10-CM | POA: Diagnosis not present

## 2017-04-10 DIAGNOSIS — M545 Low back pain: Secondary | ICD-10-CM | POA: Diagnosis not present

## 2017-04-10 DIAGNOSIS — I10 Essential (primary) hypertension: Secondary | ICD-10-CM | POA: Diagnosis not present

## 2017-04-10 DIAGNOSIS — K219 Gastro-esophageal reflux disease without esophagitis: Secondary | ICD-10-CM | POA: Diagnosis not present

## 2017-04-10 DIAGNOSIS — F419 Anxiety disorder, unspecified: Secondary | ICD-10-CM | POA: Diagnosis not present

## 2017-04-10 DIAGNOSIS — E1165 Type 2 diabetes mellitus with hyperglycemia: Secondary | ICD-10-CM | POA: Diagnosis not present

## 2017-04-16 DIAGNOSIS — K219 Gastro-esophageal reflux disease without esophagitis: Secondary | ICD-10-CM | POA: Diagnosis not present

## 2017-04-16 DIAGNOSIS — F419 Anxiety disorder, unspecified: Secondary | ICD-10-CM | POA: Diagnosis not present

## 2017-04-16 DIAGNOSIS — Z6841 Body Mass Index (BMI) 40.0 and over, adult: Secondary | ICD-10-CM | POA: Diagnosis not present

## 2017-04-16 DIAGNOSIS — I10 Essential (primary) hypertension: Secondary | ICD-10-CM | POA: Diagnosis not present

## 2017-04-16 DIAGNOSIS — E1165 Type 2 diabetes mellitus with hyperglycemia: Secondary | ICD-10-CM | POA: Diagnosis not present

## 2017-04-16 DIAGNOSIS — M545 Low back pain: Secondary | ICD-10-CM | POA: Diagnosis not present

## 2017-07-17 DIAGNOSIS — F419 Anxiety disorder, unspecified: Secondary | ICD-10-CM | POA: Diagnosis not present

## 2017-07-17 DIAGNOSIS — K219 Gastro-esophageal reflux disease without esophagitis: Secondary | ICD-10-CM | POA: Diagnosis not present

## 2017-07-17 DIAGNOSIS — I10 Essential (primary) hypertension: Secondary | ICD-10-CM | POA: Diagnosis not present

## 2017-07-17 DIAGNOSIS — E1165 Type 2 diabetes mellitus with hyperglycemia: Secondary | ICD-10-CM | POA: Diagnosis not present

## 2017-07-17 DIAGNOSIS — R079 Chest pain, unspecified: Secondary | ICD-10-CM | POA: Diagnosis not present

## 2017-07-22 DIAGNOSIS — J019 Acute sinusitis, unspecified: Secondary | ICD-10-CM | POA: Diagnosis not present

## 2017-07-22 DIAGNOSIS — F419 Anxiety disorder, unspecified: Secondary | ICD-10-CM | POA: Diagnosis not present

## 2017-07-22 DIAGNOSIS — K219 Gastro-esophageal reflux disease without esophagitis: Secondary | ICD-10-CM | POA: Diagnosis not present

## 2017-07-22 DIAGNOSIS — E1165 Type 2 diabetes mellitus with hyperglycemia: Secondary | ICD-10-CM | POA: Diagnosis not present

## 2017-07-22 DIAGNOSIS — I10 Essential (primary) hypertension: Secondary | ICD-10-CM | POA: Diagnosis not present

## 2017-07-22 DIAGNOSIS — M545 Low back pain: Secondary | ICD-10-CM | POA: Diagnosis not present

## 2017-09-02 DIAGNOSIS — M7521 Bicipital tendinitis, right shoulder: Secondary | ICD-10-CM | POA: Diagnosis not present

## 2017-10-20 DIAGNOSIS — R079 Chest pain, unspecified: Secondary | ICD-10-CM | POA: Diagnosis not present

## 2017-10-20 DIAGNOSIS — R748 Abnormal levels of other serum enzymes: Secondary | ICD-10-CM | POA: Diagnosis not present

## 2017-10-20 DIAGNOSIS — I10 Essential (primary) hypertension: Secondary | ICD-10-CM | POA: Diagnosis not present

## 2017-10-20 DIAGNOSIS — M542 Cervicalgia: Secondary | ICD-10-CM | POA: Diagnosis not present

## 2017-10-20 DIAGNOSIS — E1165 Type 2 diabetes mellitus with hyperglycemia: Secondary | ICD-10-CM | POA: Diagnosis not present

## 2017-10-20 DIAGNOSIS — M545 Low back pain: Secondary | ICD-10-CM | POA: Diagnosis not present

## 2017-10-20 DIAGNOSIS — K219 Gastro-esophageal reflux disease without esophagitis: Secondary | ICD-10-CM | POA: Diagnosis not present

## 2017-10-26 DIAGNOSIS — K219 Gastro-esophageal reflux disease without esophagitis: Secondary | ICD-10-CM | POA: Diagnosis not present

## 2017-10-26 DIAGNOSIS — R1011 Right upper quadrant pain: Secondary | ICD-10-CM | POA: Diagnosis not present

## 2017-10-26 DIAGNOSIS — M545 Low back pain: Secondary | ICD-10-CM | POA: Diagnosis not present

## 2017-10-26 DIAGNOSIS — Z0001 Encounter for general adult medical examination with abnormal findings: Secondary | ICD-10-CM | POA: Diagnosis not present

## 2017-10-26 DIAGNOSIS — I10 Essential (primary) hypertension: Secondary | ICD-10-CM | POA: Diagnosis not present

## 2017-10-26 DIAGNOSIS — R748 Abnormal levels of other serum enzymes: Secondary | ICD-10-CM | POA: Diagnosis not present

## 2017-10-26 DIAGNOSIS — F419 Anxiety disorder, unspecified: Secondary | ICD-10-CM | POA: Diagnosis not present

## 2017-10-26 DIAGNOSIS — Z6841 Body Mass Index (BMI) 40.0 and over, adult: Secondary | ICD-10-CM | POA: Diagnosis not present

## 2017-10-26 DIAGNOSIS — E1165 Type 2 diabetes mellitus with hyperglycemia: Secondary | ICD-10-CM | POA: Diagnosis not present

## 2017-10-30 DIAGNOSIS — K760 Fatty (change of) liver, not elsewhere classified: Secondary | ICD-10-CM | POA: Diagnosis not present

## 2017-10-30 DIAGNOSIS — K829 Disease of gallbladder, unspecified: Secondary | ICD-10-CM | POA: Diagnosis not present

## 2017-10-30 DIAGNOSIS — N200 Calculus of kidney: Secondary | ICD-10-CM | POA: Diagnosis not present

## 2017-10-30 DIAGNOSIS — R932 Abnormal findings on diagnostic imaging of liver and biliary tract: Secondary | ICD-10-CM | POA: Diagnosis not present

## 2017-10-30 DIAGNOSIS — R748 Abnormal levels of other serum enzymes: Secondary | ICD-10-CM | POA: Diagnosis not present

## 2017-11-09 ENCOUNTER — Encounter: Payer: Self-pay | Admitting: Internal Medicine

## 2017-11-11 ENCOUNTER — Other Ambulatory Visit: Payer: Self-pay

## 2017-11-11 DIAGNOSIS — Z6841 Body Mass Index (BMI) 40.0 and over, adult: Secondary | ICD-10-CM | POA: Diagnosis not present

## 2017-11-11 DIAGNOSIS — B370 Candidal stomatitis: Secondary | ICD-10-CM | POA: Diagnosis not present

## 2017-11-11 NOTE — Progress Notes (Signed)
Input records from [place], Dr. Saunders Glance

## 2017-11-16 ENCOUNTER — Ambulatory Visit: Payer: Medicare Other | Admitting: Gastroenterology

## 2017-11-23 DIAGNOSIS — Z6841 Body Mass Index (BMI) 40.0 and over, adult: Secondary | ICD-10-CM | POA: Diagnosis not present

## 2017-11-23 DIAGNOSIS — B370 Candidal stomatitis: Secondary | ICD-10-CM | POA: Diagnosis not present

## 2017-12-18 DIAGNOSIS — L439 Lichen planus, unspecified: Secondary | ICD-10-CM | POA: Diagnosis not present

## 2017-12-18 DIAGNOSIS — B370 Candidal stomatitis: Secondary | ICD-10-CM | POA: Diagnosis not present

## 2018-01-04 ENCOUNTER — Ambulatory Visit: Payer: Medicare Other | Admitting: Gastroenterology

## 2018-01-07 ENCOUNTER — Ambulatory Visit: Payer: Medicare Other | Admitting: Nurse Practitioner

## 2018-01-25 DIAGNOSIS — K219 Gastro-esophageal reflux disease without esophagitis: Secondary | ICD-10-CM | POA: Diagnosis not present

## 2018-01-25 DIAGNOSIS — F419 Anxiety disorder, unspecified: Secondary | ICD-10-CM | POA: Diagnosis not present

## 2018-01-25 DIAGNOSIS — R945 Abnormal results of liver function studies: Secondary | ICD-10-CM | POA: Diagnosis not present

## 2018-01-25 DIAGNOSIS — E1165 Type 2 diabetes mellitus with hyperglycemia: Secondary | ICD-10-CM | POA: Diagnosis not present

## 2018-01-25 DIAGNOSIS — I10 Essential (primary) hypertension: Secondary | ICD-10-CM | POA: Diagnosis not present

## 2018-01-25 DIAGNOSIS — R748 Abnormal levels of other serum enzymes: Secondary | ICD-10-CM | POA: Diagnosis not present

## 2018-01-28 DIAGNOSIS — F419 Anxiety disorder, unspecified: Secondary | ICD-10-CM | POA: Diagnosis not present

## 2018-01-28 DIAGNOSIS — K219 Gastro-esophageal reflux disease without esophagitis: Secondary | ICD-10-CM | POA: Diagnosis not present

## 2018-01-28 DIAGNOSIS — R748 Abnormal levels of other serum enzymes: Secondary | ICD-10-CM | POA: Diagnosis not present

## 2018-01-28 DIAGNOSIS — E1165 Type 2 diabetes mellitus with hyperglycemia: Secondary | ICD-10-CM | POA: Diagnosis not present

## 2018-01-28 DIAGNOSIS — Z6841 Body Mass Index (BMI) 40.0 and over, adult: Secondary | ICD-10-CM | POA: Diagnosis not present

## 2018-01-28 DIAGNOSIS — M545 Low back pain: Secondary | ICD-10-CM | POA: Diagnosis not present

## 2018-01-28 DIAGNOSIS — I10 Essential (primary) hypertension: Secondary | ICD-10-CM | POA: Diagnosis not present

## 2018-02-11 DIAGNOSIS — Z87891 Personal history of nicotine dependence: Secondary | ICD-10-CM | POA: Diagnosis not present

## 2018-02-11 DIAGNOSIS — L439 Lichen planus, unspecified: Secondary | ICD-10-CM | POA: Diagnosis not present

## 2018-02-11 DIAGNOSIS — Z7289 Other problems related to lifestyle: Secondary | ICD-10-CM | POA: Diagnosis not present

## 2018-04-16 DIAGNOSIS — R748 Abnormal levels of other serum enzymes: Secondary | ICD-10-CM | POA: Diagnosis not present

## 2018-04-16 DIAGNOSIS — E1165 Type 2 diabetes mellitus with hyperglycemia: Secondary | ICD-10-CM | POA: Diagnosis not present

## 2018-04-16 DIAGNOSIS — R945 Abnormal results of liver function studies: Secondary | ICD-10-CM | POA: Diagnosis not present

## 2018-04-16 DIAGNOSIS — K219 Gastro-esophageal reflux disease without esophagitis: Secondary | ICD-10-CM | POA: Diagnosis not present

## 2018-04-16 DIAGNOSIS — I10 Essential (primary) hypertension: Secondary | ICD-10-CM | POA: Diagnosis not present

## 2018-04-19 DIAGNOSIS — M545 Low back pain: Secondary | ICD-10-CM | POA: Diagnosis not present

## 2018-04-19 DIAGNOSIS — E1165 Type 2 diabetes mellitus with hyperglycemia: Secondary | ICD-10-CM | POA: Diagnosis not present

## 2018-04-19 DIAGNOSIS — I10 Essential (primary) hypertension: Secondary | ICD-10-CM | POA: Diagnosis not present

## 2018-04-19 DIAGNOSIS — K219 Gastro-esophageal reflux disease without esophagitis: Secondary | ICD-10-CM | POA: Diagnosis not present

## 2018-04-19 DIAGNOSIS — R748 Abnormal levels of other serum enzymes: Secondary | ICD-10-CM | POA: Diagnosis not present

## 2018-04-19 DIAGNOSIS — Z1331 Encounter for screening for depression: Secondary | ICD-10-CM | POA: Diagnosis not present

## 2018-04-19 DIAGNOSIS — Z1389 Encounter for screening for other disorder: Secondary | ICD-10-CM | POA: Diagnosis not present

## 2018-04-19 DIAGNOSIS — Z6841 Body Mass Index (BMI) 40.0 and over, adult: Secondary | ICD-10-CM | POA: Diagnosis not present

## 2018-07-07 DIAGNOSIS — E1165 Type 2 diabetes mellitus with hyperglycemia: Secondary | ICD-10-CM | POA: Diagnosis not present

## 2018-07-07 DIAGNOSIS — K219 Gastro-esophageal reflux disease without esophagitis: Secondary | ICD-10-CM | POA: Diagnosis not present

## 2018-07-07 DIAGNOSIS — I10 Essential (primary) hypertension: Secondary | ICD-10-CM | POA: Diagnosis not present

## 2018-07-07 DIAGNOSIS — R945 Abnormal results of liver function studies: Secondary | ICD-10-CM | POA: Diagnosis not present

## 2018-07-14 DIAGNOSIS — F419 Anxiety disorder, unspecified: Secondary | ICD-10-CM | POA: Diagnosis not present

## 2018-07-14 DIAGNOSIS — E1165 Type 2 diabetes mellitus with hyperglycemia: Secondary | ICD-10-CM | POA: Diagnosis not present

## 2018-07-14 DIAGNOSIS — R748 Abnormal levels of other serum enzymes: Secondary | ICD-10-CM | POA: Diagnosis not present

## 2018-07-14 DIAGNOSIS — I10 Essential (primary) hypertension: Secondary | ICD-10-CM | POA: Diagnosis not present

## 2018-07-14 DIAGNOSIS — K219 Gastro-esophageal reflux disease without esophagitis: Secondary | ICD-10-CM | POA: Diagnosis not present

## 2018-07-14 DIAGNOSIS — M25512 Pain in left shoulder: Secondary | ICD-10-CM | POA: Diagnosis not present

## 2018-07-14 DIAGNOSIS — M545 Low back pain: Secondary | ICD-10-CM | POA: Diagnosis not present

## 2018-07-14 DIAGNOSIS — Z6841 Body Mass Index (BMI) 40.0 and over, adult: Secondary | ICD-10-CM | POA: Diagnosis not present

## 2018-10-04 DIAGNOSIS — I10 Essential (primary) hypertension: Secondary | ICD-10-CM | POA: Diagnosis not present

## 2018-10-04 DIAGNOSIS — E1165 Type 2 diabetes mellitus with hyperglycemia: Secondary | ICD-10-CM | POA: Diagnosis not present

## 2018-10-04 DIAGNOSIS — K219 Gastro-esophageal reflux disease without esophagitis: Secondary | ICD-10-CM | POA: Diagnosis not present

## 2018-10-07 DIAGNOSIS — F419 Anxiety disorder, unspecified: Secondary | ICD-10-CM | POA: Diagnosis not present

## 2018-10-07 DIAGNOSIS — M545 Low back pain: Secondary | ICD-10-CM | POA: Diagnosis not present

## 2018-10-07 DIAGNOSIS — K219 Gastro-esophageal reflux disease without esophagitis: Secondary | ICD-10-CM | POA: Diagnosis not present

## 2018-10-07 DIAGNOSIS — R739 Hyperglycemia, unspecified: Secondary | ICD-10-CM | POA: Diagnosis not present

## 2018-10-07 DIAGNOSIS — Z6841 Body Mass Index (BMI) 40.0 and over, adult: Secondary | ICD-10-CM | POA: Diagnosis not present

## 2018-10-22 ENCOUNTER — Encounter: Payer: Self-pay | Admitting: Gastroenterology

## 2018-12-23 DIAGNOSIS — M545 Low back pain: Secondary | ICD-10-CM | POA: Diagnosis not present

## 2018-12-23 DIAGNOSIS — K219 Gastro-esophageal reflux disease without esophagitis: Secondary | ICD-10-CM | POA: Diagnosis not present

## 2018-12-23 DIAGNOSIS — F419 Anxiety disorder, unspecified: Secondary | ICD-10-CM | POA: Diagnosis not present

## 2018-12-23 DIAGNOSIS — R739 Hyperglycemia, unspecified: Secondary | ICD-10-CM | POA: Diagnosis not present

## 2018-12-23 DIAGNOSIS — Z6841 Body Mass Index (BMI) 40.0 and over, adult: Secondary | ICD-10-CM | POA: Diagnosis not present

## 2019-03-18 DIAGNOSIS — R739 Hyperglycemia, unspecified: Secondary | ICD-10-CM | POA: Diagnosis not present

## 2019-03-18 DIAGNOSIS — I10 Essential (primary) hypertension: Secondary | ICD-10-CM | POA: Diagnosis not present

## 2019-03-18 DIAGNOSIS — R945 Abnormal results of liver function studies: Secondary | ICD-10-CM | POA: Diagnosis not present

## 2019-03-18 DIAGNOSIS — K219 Gastro-esophageal reflux disease without esophagitis: Secondary | ICD-10-CM | POA: Diagnosis not present

## 2019-03-18 DIAGNOSIS — E1165 Type 2 diabetes mellitus with hyperglycemia: Secondary | ICD-10-CM | POA: Diagnosis not present

## 2019-03-21 DIAGNOSIS — M545 Low back pain: Secondary | ICD-10-CM | POA: Diagnosis not present

## 2019-03-21 DIAGNOSIS — K219 Gastro-esophageal reflux disease without esophagitis: Secondary | ICD-10-CM | POA: Diagnosis not present

## 2019-03-21 DIAGNOSIS — Z6841 Body Mass Index (BMI) 40.0 and over, adult: Secondary | ICD-10-CM | POA: Diagnosis not present

## 2019-03-21 DIAGNOSIS — L438 Other lichen planus: Secondary | ICD-10-CM | POA: Diagnosis not present

## 2019-03-21 DIAGNOSIS — F419 Anxiety disorder, unspecified: Secondary | ICD-10-CM | POA: Diagnosis not present

## 2019-03-21 DIAGNOSIS — R739 Hyperglycemia, unspecified: Secondary | ICD-10-CM | POA: Diagnosis not present

## 2019-06-13 DIAGNOSIS — K219 Gastro-esophageal reflux disease without esophagitis: Secondary | ICD-10-CM | POA: Diagnosis not present

## 2019-06-13 DIAGNOSIS — R739 Hyperglycemia, unspecified: Secondary | ICD-10-CM | POA: Diagnosis not present

## 2019-06-13 DIAGNOSIS — R945 Abnormal results of liver function studies: Secondary | ICD-10-CM | POA: Diagnosis not present

## 2019-06-13 DIAGNOSIS — E1165 Type 2 diabetes mellitus with hyperglycemia: Secondary | ICD-10-CM | POA: Diagnosis not present

## 2019-06-13 DIAGNOSIS — I10 Essential (primary) hypertension: Secondary | ICD-10-CM | POA: Diagnosis not present

## 2019-06-16 DIAGNOSIS — K219 Gastro-esophageal reflux disease without esophagitis: Secondary | ICD-10-CM | POA: Diagnosis not present

## 2019-06-16 DIAGNOSIS — F419 Anxiety disorder, unspecified: Secondary | ICD-10-CM | POA: Diagnosis not present

## 2019-06-16 DIAGNOSIS — Z8781 Personal history of (healed) traumatic fracture: Secondary | ICD-10-CM | POA: Diagnosis not present

## 2019-06-16 DIAGNOSIS — M545 Low back pain: Secondary | ICD-10-CM | POA: Diagnosis not present

## 2019-06-16 DIAGNOSIS — R739 Hyperglycemia, unspecified: Secondary | ICD-10-CM | POA: Diagnosis not present

## 2019-06-16 DIAGNOSIS — L438 Other lichen planus: Secondary | ICD-10-CM | POA: Diagnosis not present

## 2019-06-16 DIAGNOSIS — Z6841 Body Mass Index (BMI) 40.0 and over, adult: Secondary | ICD-10-CM | POA: Diagnosis not present

## 2019-09-12 DIAGNOSIS — I10 Essential (primary) hypertension: Secondary | ICD-10-CM | POA: Diagnosis not present

## 2019-09-12 DIAGNOSIS — Z13220 Encounter for screening for lipoid disorders: Secondary | ICD-10-CM | POA: Diagnosis not present

## 2019-09-12 DIAGNOSIS — K219 Gastro-esophageal reflux disease without esophagitis: Secondary | ICD-10-CM | POA: Diagnosis not present

## 2019-09-12 DIAGNOSIS — E1165 Type 2 diabetes mellitus with hyperglycemia: Secondary | ICD-10-CM | POA: Diagnosis not present

## 2019-09-12 DIAGNOSIS — R945 Abnormal results of liver function studies: Secondary | ICD-10-CM | POA: Diagnosis not present

## 2019-09-15 DIAGNOSIS — Z8781 Personal history of (healed) traumatic fracture: Secondary | ICD-10-CM | POA: Diagnosis not present

## 2019-09-15 DIAGNOSIS — Z1331 Encounter for screening for depression: Secondary | ICD-10-CM | POA: Diagnosis not present

## 2019-09-15 DIAGNOSIS — Z1389 Encounter for screening for other disorder: Secondary | ICD-10-CM | POA: Diagnosis not present

## 2019-09-15 DIAGNOSIS — L438 Other lichen planus: Secondary | ICD-10-CM | POA: Diagnosis not present

## 2019-09-15 DIAGNOSIS — F419 Anxiety disorder, unspecified: Secondary | ICD-10-CM | POA: Diagnosis not present

## 2019-09-15 DIAGNOSIS — Z6841 Body Mass Index (BMI) 40.0 and over, adult: Secondary | ICD-10-CM | POA: Diagnosis not present

## 2019-09-15 DIAGNOSIS — R739 Hyperglycemia, unspecified: Secondary | ICD-10-CM | POA: Diagnosis not present

## 2019-09-15 DIAGNOSIS — K219 Gastro-esophageal reflux disease without esophagitis: Secondary | ICD-10-CM | POA: Diagnosis not present

## 2019-10-27 DIAGNOSIS — H40033 Anatomical narrow angle, bilateral: Secondary | ICD-10-CM | POA: Diagnosis not present

## 2019-10-27 DIAGNOSIS — H04123 Dry eye syndrome of bilateral lacrimal glands: Secondary | ICD-10-CM | POA: Diagnosis not present

## 2019-11-18 ENCOUNTER — Ambulatory Visit: Admit: 2019-11-18 | Discharge: 2019-11-18 | Payer: PRIVATE HEALTH INSURANCE

## 2019-11-18 DIAGNOSIS — Z23 Encounter for immunization: Secondary | ICD-10-CM

## 2019-11-29 DIAGNOSIS — R945 Abnormal results of liver function studies: Secondary | ICD-10-CM | POA: Diagnosis not present

## 2019-11-29 DIAGNOSIS — I10 Essential (primary) hypertension: Secondary | ICD-10-CM | POA: Diagnosis not present

## 2019-11-29 DIAGNOSIS — R748 Abnormal levels of other serum enzymes: Secondary | ICD-10-CM | POA: Diagnosis not present

## 2019-11-29 DIAGNOSIS — Z13220 Encounter for screening for lipoid disorders: Secondary | ICD-10-CM | POA: Diagnosis not present

## 2019-11-29 DIAGNOSIS — K219 Gastro-esophageal reflux disease without esophagitis: Secondary | ICD-10-CM | POA: Diagnosis not present

## 2019-11-29 DIAGNOSIS — E1165 Type 2 diabetes mellitus with hyperglycemia: Secondary | ICD-10-CM | POA: Diagnosis not present

## 2019-12-06 DIAGNOSIS — Z6839 Body mass index (BMI) 39.0-39.9, adult: Secondary | ICD-10-CM | POA: Diagnosis not present

## 2019-12-06 DIAGNOSIS — Z8781 Personal history of (healed) traumatic fracture: Secondary | ICD-10-CM | POA: Diagnosis not present

## 2019-12-06 DIAGNOSIS — K219 Gastro-esophageal reflux disease without esophagitis: Secondary | ICD-10-CM | POA: Diagnosis not present

## 2019-12-06 DIAGNOSIS — M545 Low back pain: Secondary | ICD-10-CM | POA: Diagnosis not present

## 2019-12-06 DIAGNOSIS — F419 Anxiety disorder, unspecified: Secondary | ICD-10-CM | POA: Diagnosis not present

## 2019-12-06 DIAGNOSIS — L438 Other lichen planus: Secondary | ICD-10-CM | POA: Diagnosis not present

## 2019-12-06 DIAGNOSIS — R739 Hyperglycemia, unspecified: Secondary | ICD-10-CM | POA: Diagnosis not present

## 2019-12-06 DIAGNOSIS — Z0001 Encounter for general adult medical examination with abnormal findings: Secondary | ICD-10-CM | POA: Diagnosis not present

## 2019-12-09 ENCOUNTER — Ambulatory Visit: Admit: 2019-12-09 | Discharge: 2019-12-09 | Payer: PRIVATE HEALTH INSURANCE

## 2019-12-09 DIAGNOSIS — Z23 Encounter for immunization: Secondary | ICD-10-CM

## 2020-03-02 DIAGNOSIS — K219 Gastro-esophageal reflux disease without esophagitis: Secondary | ICD-10-CM | POA: Diagnosis not present

## 2020-03-02 DIAGNOSIS — R945 Abnormal results of liver function studies: Secondary | ICD-10-CM | POA: Diagnosis not present

## 2020-03-02 DIAGNOSIS — R748 Abnormal levels of other serum enzymes: Secondary | ICD-10-CM | POA: Diagnosis not present

## 2020-03-02 DIAGNOSIS — I10 Essential (primary) hypertension: Secondary | ICD-10-CM | POA: Diagnosis not present

## 2020-03-02 DIAGNOSIS — E1165 Type 2 diabetes mellitus with hyperglycemia: Secondary | ICD-10-CM | POA: Diagnosis not present

## 2020-03-06 DIAGNOSIS — Z6839 Body mass index (BMI) 39.0-39.9, adult: Secondary | ICD-10-CM | POA: Diagnosis not present

## 2020-03-06 DIAGNOSIS — R739 Hyperglycemia, unspecified: Secondary | ICD-10-CM | POA: Diagnosis not present

## 2020-03-06 DIAGNOSIS — M545 Low back pain: Secondary | ICD-10-CM | POA: Diagnosis not present

## 2020-03-06 DIAGNOSIS — L438 Other lichen planus: Secondary | ICD-10-CM | POA: Diagnosis not present

## 2020-03-06 DIAGNOSIS — K219 Gastro-esophageal reflux disease without esophagitis: Secondary | ICD-10-CM | POA: Diagnosis not present

## 2020-03-06 DIAGNOSIS — Z8781 Personal history of (healed) traumatic fracture: Secondary | ICD-10-CM | POA: Diagnosis not present

## 2020-03-06 DIAGNOSIS — F419 Anxiety disorder, unspecified: Secondary | ICD-10-CM | POA: Diagnosis not present

## 2020-03-23 DIAGNOSIS — Z20828 Contact with and (suspected) exposure to other viral communicable diseases: Secondary | ICD-10-CM | POA: Diagnosis not present

## 2020-05-31 DIAGNOSIS — R748 Abnormal levels of other serum enzymes: Secondary | ICD-10-CM | POA: Diagnosis not present

## 2020-05-31 DIAGNOSIS — K219 Gastro-esophageal reflux disease without esophagitis: Secondary | ICD-10-CM | POA: Diagnosis not present

## 2020-05-31 DIAGNOSIS — I10 Essential (primary) hypertension: Secondary | ICD-10-CM | POA: Diagnosis not present

## 2020-05-31 DIAGNOSIS — R945 Abnormal results of liver function studies: Secondary | ICD-10-CM | POA: Diagnosis not present

## 2020-05-31 DIAGNOSIS — E1165 Type 2 diabetes mellitus with hyperglycemia: Secondary | ICD-10-CM | POA: Diagnosis not present

## 2020-05-31 DIAGNOSIS — R739 Hyperglycemia, unspecified: Secondary | ICD-10-CM | POA: Diagnosis not present

## 2020-05-31 DIAGNOSIS — Z13220 Encounter for screening for lipoid disorders: Secondary | ICD-10-CM | POA: Diagnosis not present

## 2020-06-04 DIAGNOSIS — Z8781 Personal history of (healed) traumatic fracture: Secondary | ICD-10-CM | POA: Diagnosis not present

## 2020-06-04 DIAGNOSIS — Z6838 Body mass index (BMI) 38.0-38.9, adult: Secondary | ICD-10-CM | POA: Diagnosis not present

## 2020-06-04 DIAGNOSIS — F419 Anxiety disorder, unspecified: Secondary | ICD-10-CM | POA: Diagnosis not present

## 2020-06-04 DIAGNOSIS — R739 Hyperglycemia, unspecified: Secondary | ICD-10-CM | POA: Diagnosis not present

## 2020-06-04 DIAGNOSIS — K219 Gastro-esophageal reflux disease without esophagitis: Secondary | ICD-10-CM | POA: Diagnosis not present

## 2020-06-04 DIAGNOSIS — L438 Other lichen planus: Secondary | ICD-10-CM | POA: Diagnosis not present

## 2020-08-02 ENCOUNTER — Ambulatory Visit: Admit: 2020-08-02 | Discharge: 2020-08-02 | Payer: PRIVATE HEALTH INSURANCE

## 2020-08-02 DIAGNOSIS — Z23 Encounter for immunization: Secondary | ICD-10-CM

## 2020-08-30 DIAGNOSIS — Z13220 Encounter for screening for lipoid disorders: Secondary | ICD-10-CM | POA: Diagnosis not present

## 2020-08-30 DIAGNOSIS — E1165 Type 2 diabetes mellitus with hyperglycemia: Secondary | ICD-10-CM | POA: Diagnosis not present

## 2020-08-30 DIAGNOSIS — I10 Essential (primary) hypertension: Secondary | ICD-10-CM | POA: Diagnosis not present

## 2020-08-30 DIAGNOSIS — R945 Abnormal results of liver function studies: Secondary | ICD-10-CM | POA: Diagnosis not present

## 2020-09-03 DIAGNOSIS — S39012A Strain of muscle, fascia and tendon of lower back, initial encounter: Secondary | ICD-10-CM | POA: Diagnosis not present

## 2020-09-03 DIAGNOSIS — Z8781 Personal history of (healed) traumatic fracture: Secondary | ICD-10-CM | POA: Diagnosis not present

## 2020-09-03 DIAGNOSIS — Z6837 Body mass index (BMI) 37.0-37.9, adult: Secondary | ICD-10-CM | POA: Diagnosis not present

## 2020-09-03 DIAGNOSIS — R739 Hyperglycemia, unspecified: Secondary | ICD-10-CM | POA: Diagnosis not present

## 2020-09-03 DIAGNOSIS — F419 Anxiety disorder, unspecified: Secondary | ICD-10-CM | POA: Diagnosis not present

## 2020-09-03 DIAGNOSIS — L438 Other lichen planus: Secondary | ICD-10-CM | POA: Diagnosis not present

## 2020-09-03 DIAGNOSIS — K219 Gastro-esophageal reflux disease without esophagitis: Secondary | ICD-10-CM | POA: Diagnosis not present

## 2020-10-15 DIAGNOSIS — R0981 Nasal congestion: Secondary | ICD-10-CM | POA: Diagnosis not present

## 2020-10-15 DIAGNOSIS — Z20828 Contact with and (suspected) exposure to other viral communicable diseases: Secondary | ICD-10-CM | POA: Diagnosis not present

## 2020-11-27 DIAGNOSIS — Z1389 Encounter for screening for other disorder: Secondary | ICD-10-CM | POA: Diagnosis not present

## 2020-11-27 DIAGNOSIS — Z13220 Encounter for screening for lipoid disorders: Secondary | ICD-10-CM | POA: Diagnosis not present

## 2020-11-27 DIAGNOSIS — I10 Essential (primary) hypertension: Secondary | ICD-10-CM | POA: Diagnosis not present

## 2020-11-27 DIAGNOSIS — K219 Gastro-esophageal reflux disease without esophagitis: Secondary | ICD-10-CM | POA: Diagnosis not present

## 2020-11-27 DIAGNOSIS — R739 Hyperglycemia, unspecified: Secondary | ICD-10-CM | POA: Diagnosis not present

## 2020-12-04 DIAGNOSIS — M25511 Pain in right shoulder: Secondary | ICD-10-CM | POA: Diagnosis not present

## 2020-12-04 DIAGNOSIS — Z8781 Personal history of (healed) traumatic fracture: Secondary | ICD-10-CM | POA: Diagnosis not present

## 2020-12-04 DIAGNOSIS — L438 Other lichen planus: Secondary | ICD-10-CM | POA: Diagnosis not present

## 2020-12-04 DIAGNOSIS — S39012A Strain of muscle, fascia and tendon of lower back, initial encounter: Secondary | ICD-10-CM | POA: Diagnosis not present

## 2020-12-04 DIAGNOSIS — R739 Hyperglycemia, unspecified: Secondary | ICD-10-CM | POA: Diagnosis not present

## 2020-12-04 DIAGNOSIS — K219 Gastro-esophageal reflux disease without esophagitis: Secondary | ICD-10-CM | POA: Diagnosis not present

## 2020-12-04 DIAGNOSIS — F419 Anxiety disorder, unspecified: Secondary | ICD-10-CM | POA: Diagnosis not present

## 2021-03-01 DIAGNOSIS — Z13220 Encounter for screening for lipoid disorders: Secondary | ICD-10-CM | POA: Diagnosis not present

## 2021-03-01 DIAGNOSIS — K219 Gastro-esophageal reflux disease without esophagitis: Secondary | ICD-10-CM | POA: Diagnosis not present

## 2021-03-01 DIAGNOSIS — I10 Essential (primary) hypertension: Secondary | ICD-10-CM | POA: Diagnosis not present

## 2021-03-01 DIAGNOSIS — D631 Anemia in chronic kidney disease: Secondary | ICD-10-CM | POA: Diagnosis not present

## 2021-03-01 DIAGNOSIS — E1165 Type 2 diabetes mellitus with hyperglycemia: Secondary | ICD-10-CM | POA: Diagnosis not present

## 2021-03-05 DIAGNOSIS — Z8781 Personal history of (healed) traumatic fracture: Secondary | ICD-10-CM | POA: Diagnosis not present

## 2021-03-05 DIAGNOSIS — R079 Chest pain, unspecified: Secondary | ICD-10-CM | POA: Diagnosis not present

## 2021-03-05 DIAGNOSIS — R739 Hyperglycemia, unspecified: Secondary | ICD-10-CM | POA: Diagnosis not present

## 2021-03-05 DIAGNOSIS — L438 Other lichen planus: Secondary | ICD-10-CM | POA: Diagnosis not present

## 2021-03-05 DIAGNOSIS — S39012A Strain of muscle, fascia and tendon of lower back, initial encounter: Secondary | ICD-10-CM | POA: Diagnosis not present

## 2021-03-05 DIAGNOSIS — F419 Anxiety disorder, unspecified: Secondary | ICD-10-CM | POA: Diagnosis not present

## 2021-03-05 DIAGNOSIS — M25511 Pain in right shoulder: Secondary | ICD-10-CM | POA: Diagnosis not present

## 2021-03-05 DIAGNOSIS — K219 Gastro-esophageal reflux disease without esophagitis: Secondary | ICD-10-CM | POA: Diagnosis not present

## 2021-03-08 DIAGNOSIS — K828 Other specified diseases of gallbladder: Secondary | ICD-10-CM | POA: Diagnosis not present

## 2021-03-08 DIAGNOSIS — R932 Abnormal findings on diagnostic imaging of liver and biliary tract: Secondary | ICD-10-CM | POA: Diagnosis not present

## 2021-03-08 DIAGNOSIS — N200 Calculus of kidney: Secondary | ICD-10-CM | POA: Diagnosis not present

## 2021-03-08 DIAGNOSIS — R748 Abnormal levels of other serum enzymes: Secondary | ICD-10-CM | POA: Diagnosis not present

## 2021-03-08 DIAGNOSIS — K8020 Calculus of gallbladder without cholecystitis without obstruction: Secondary | ICD-10-CM | POA: Diagnosis not present

## 2021-03-18 DIAGNOSIS — K8020 Calculus of gallbladder without cholecystitis without obstruction: Secondary | ICD-10-CM | POA: Diagnosis not present

## 2021-03-18 DIAGNOSIS — F419 Anxiety disorder, unspecified: Secondary | ICD-10-CM | POA: Diagnosis not present

## 2021-03-18 DIAGNOSIS — C220 Liver cell carcinoma: Secondary | ICD-10-CM | POA: Diagnosis not present

## 2021-03-18 DIAGNOSIS — J984 Other disorders of lung: Secondary | ICD-10-CM | POA: Diagnosis not present

## 2021-03-18 DIAGNOSIS — Z1159 Encounter for screening for other viral diseases: Secondary | ICD-10-CM | POA: Diagnosis not present

## 2021-03-18 DIAGNOSIS — R251 Tremor, unspecified: Secondary | ICD-10-CM | POA: Diagnosis not present

## 2021-03-18 DIAGNOSIS — G119 Hereditary ataxia, unspecified: Secondary | ICD-10-CM | POA: Diagnosis not present

## 2021-03-18 DIAGNOSIS — Z5189 Encounter for other specified aftercare: Secondary | ICD-10-CM | POA: Diagnosis not present

## 2021-03-18 DIAGNOSIS — R932 Abnormal findings on diagnostic imaging of liver and biliary tract: Secondary | ICD-10-CM | POA: Diagnosis not present

## 2021-03-18 DIAGNOSIS — G969 Disorder of central nervous system, unspecified: Secondary | ICD-10-CM | POA: Diagnosis not present

## 2021-03-18 DIAGNOSIS — D751 Secondary polycythemia: Secondary | ICD-10-CM | POA: Diagnosis not present

## 2021-03-18 DIAGNOSIS — F17200 Nicotine dependence, unspecified, uncomplicated: Secondary | ICD-10-CM | POA: Diagnosis not present

## 2021-03-18 DIAGNOSIS — I10 Essential (primary) hypertension: Secondary | ICD-10-CM | POA: Diagnosis not present

## 2021-03-18 DIAGNOSIS — Z7729 Contact with and (suspected ) exposure to other hazardous substances: Secondary | ICD-10-CM | POA: Diagnosis not present

## 2021-03-18 DIAGNOSIS — F1020 Alcohol dependence, uncomplicated: Secondary | ICD-10-CM | POA: Diagnosis not present

## 2021-03-18 DIAGNOSIS — Z129 Encounter for screening for malignant neoplasm, site unspecified: Secondary | ICD-10-CM | POA: Diagnosis not present

## 2021-03-18 DIAGNOSIS — K700 Alcoholic fatty liver: Secondary | ICD-10-CM | POA: Diagnosis not present

## 2021-03-18 DIAGNOSIS — R799 Abnormal finding of blood chemistry, unspecified: Secondary | ICD-10-CM | POA: Diagnosis not present

## 2021-03-18 DIAGNOSIS — Z800 Family history of malignant neoplasm of digestive organs: Secondary | ICD-10-CM | POA: Diagnosis not present

## 2021-03-18 DIAGNOSIS — R945 Abnormal results of liver function studies: Secondary | ICD-10-CM | POA: Diagnosis not present

## 2021-03-18 DIAGNOSIS — R7989 Other specified abnormal findings of blood chemistry: Secondary | ICD-10-CM | POA: Diagnosis not present

## 2021-03-18 DIAGNOSIS — K760 Fatty (change of) liver, not elsewhere classified: Secondary | ICD-10-CM | POA: Diagnosis not present

## 2021-03-25 ENCOUNTER — Other Ambulatory Visit (HOSPITAL_COMMUNITY): Payer: Self-pay | Admitting: Radiology

## 2021-03-25 DIAGNOSIS — R918 Other nonspecific abnormal finding of lung field: Secondary | ICD-10-CM

## 2021-03-25 DIAGNOSIS — F17200 Nicotine dependence, unspecified, uncomplicated: Secondary | ICD-10-CM

## 2021-03-25 DIAGNOSIS — Z7729 Contact with and (suspected ) exposure to other hazardous substances: Secondary | ICD-10-CM

## 2021-03-25 DIAGNOSIS — T59811A Toxic effect of smoke, accidental (unintentional), initial encounter: Secondary | ICD-10-CM

## 2021-03-26 DIAGNOSIS — R9439 Abnormal result of other cardiovascular function study: Secondary | ICD-10-CM | POA: Diagnosis not present

## 2021-03-26 DIAGNOSIS — R079 Chest pain, unspecified: Secondary | ICD-10-CM | POA: Diagnosis not present

## 2021-03-29 DIAGNOSIS — Z7729 Contact with and (suspected ) exposure to other hazardous substances: Secondary | ICD-10-CM | POA: Diagnosis not present

## 2021-03-29 DIAGNOSIS — Z1159 Encounter for screening for other viral diseases: Secondary | ICD-10-CM | POA: Diagnosis not present

## 2021-03-29 DIAGNOSIS — R932 Abnormal findings on diagnostic imaging of liver and biliary tract: Secondary | ICD-10-CM | POA: Diagnosis not present

## 2021-03-29 DIAGNOSIS — R945 Abnormal results of liver function studies: Secondary | ICD-10-CM | POA: Diagnosis not present

## 2021-03-29 DIAGNOSIS — F1020 Alcohol dependence, uncomplicated: Secondary | ICD-10-CM | POA: Diagnosis not present

## 2021-03-29 DIAGNOSIS — R799 Abnormal finding of blood chemistry, unspecified: Secondary | ICD-10-CM | POA: Diagnosis not present

## 2021-03-29 DIAGNOSIS — R251 Tremor, unspecified: Secondary | ICD-10-CM | POA: Diagnosis not present

## 2021-03-29 DIAGNOSIS — G969 Disorder of central nervous system, unspecified: Secondary | ICD-10-CM | POA: Diagnosis not present

## 2021-03-29 DIAGNOSIS — F17200 Nicotine dependence, unspecified, uncomplicated: Secondary | ICD-10-CM | POA: Diagnosis not present

## 2021-03-29 DIAGNOSIS — D751 Secondary polycythemia: Secondary | ICD-10-CM | POA: Diagnosis not present

## 2021-03-29 DIAGNOSIS — K700 Alcoholic fatty liver: Secondary | ICD-10-CM | POA: Diagnosis not present

## 2021-03-29 DIAGNOSIS — G119 Hereditary ataxia, unspecified: Secondary | ICD-10-CM | POA: Diagnosis not present

## 2021-04-10 DIAGNOSIS — N200 Calculus of kidney: Secondary | ICD-10-CM | POA: Diagnosis not present

## 2021-04-10 DIAGNOSIS — F17200 Nicotine dependence, unspecified, uncomplicated: Secondary | ICD-10-CM | POA: Diagnosis not present

## 2021-04-10 DIAGNOSIS — G969 Disorder of central nervous system, unspecified: Secondary | ICD-10-CM | POA: Diagnosis not present

## 2021-04-10 DIAGNOSIS — I700 Atherosclerosis of aorta: Secondary | ICD-10-CM | POA: Diagnosis not present

## 2021-04-10 DIAGNOSIS — F1020 Alcohol dependence, uncomplicated: Secondary | ICD-10-CM | POA: Diagnosis not present

## 2021-04-10 DIAGNOSIS — R251 Tremor, unspecified: Secondary | ICD-10-CM | POA: Diagnosis not present

## 2021-04-10 DIAGNOSIS — R7989 Other specified abnormal findings of blood chemistry: Secondary | ICD-10-CM | POA: Diagnosis not present

## 2021-04-10 DIAGNOSIS — Z7729 Contact with and (suspected ) exposure to other hazardous substances: Secondary | ICD-10-CM | POA: Diagnosis not present

## 2021-04-10 DIAGNOSIS — R918 Other nonspecific abnormal finding of lung field: Secondary | ICD-10-CM | POA: Diagnosis not present

## 2021-04-10 DIAGNOSIS — K8020 Calculus of gallbladder without cholecystitis without obstruction: Secondary | ICD-10-CM | POA: Diagnosis not present

## 2021-04-10 DIAGNOSIS — D751 Secondary polycythemia: Secondary | ICD-10-CM | POA: Diagnosis not present

## 2021-04-10 DIAGNOSIS — G119 Hereditary ataxia, unspecified: Secondary | ICD-10-CM | POA: Diagnosis not present

## 2021-04-10 DIAGNOSIS — K7689 Other specified diseases of liver: Secondary | ICD-10-CM | POA: Diagnosis not present

## 2021-04-10 DIAGNOSIS — K3189 Other diseases of stomach and duodenum: Secondary | ICD-10-CM | POA: Diagnosis not present

## 2021-04-23 DIAGNOSIS — F1020 Alcohol dependence, uncomplicated: Secondary | ICD-10-CM | POA: Diagnosis not present

## 2021-04-23 DIAGNOSIS — R945 Abnormal results of liver function studies: Secondary | ICD-10-CM | POA: Diagnosis not present

## 2021-04-23 DIAGNOSIS — D751 Secondary polycythemia: Secondary | ICD-10-CM | POA: Diagnosis not present

## 2021-04-23 DIAGNOSIS — R911 Solitary pulmonary nodule: Secondary | ICD-10-CM | POA: Diagnosis not present

## 2021-04-23 DIAGNOSIS — F17200 Nicotine dependence, unspecified, uncomplicated: Secondary | ICD-10-CM | POA: Diagnosis not present

## 2021-04-23 DIAGNOSIS — Z7729 Contact with and (suspected ) exposure to other hazardous substances: Secondary | ICD-10-CM | POA: Diagnosis not present

## 2021-04-23 DIAGNOSIS — K700 Alcoholic fatty liver: Secondary | ICD-10-CM | POA: Diagnosis not present

## 2021-04-23 DIAGNOSIS — G4733 Obstructive sleep apnea (adult) (pediatric): Secondary | ICD-10-CM | POA: Diagnosis not present

## 2021-04-23 DIAGNOSIS — G119 Hereditary ataxia, unspecified: Secondary | ICD-10-CM | POA: Diagnosis not present

## 2021-04-23 DIAGNOSIS — R251 Tremor, unspecified: Secondary | ICD-10-CM | POA: Diagnosis not present

## 2021-04-23 DIAGNOSIS — G969 Disorder of central nervous system, unspecified: Secondary | ICD-10-CM | POA: Diagnosis not present

## 2021-04-23 DIAGNOSIS — R799 Abnormal finding of blood chemistry, unspecified: Secondary | ICD-10-CM | POA: Diagnosis not present

## 2021-05-29 DIAGNOSIS — R945 Abnormal results of liver function studies: Secondary | ICD-10-CM | POA: Diagnosis not present

## 2021-05-29 DIAGNOSIS — I10 Essential (primary) hypertension: Secondary | ICD-10-CM | POA: Diagnosis not present

## 2021-05-29 DIAGNOSIS — Z13220 Encounter for screening for lipoid disorders: Secondary | ICD-10-CM | POA: Diagnosis not present

## 2021-05-29 DIAGNOSIS — K219 Gastro-esophageal reflux disease without esophagitis: Secondary | ICD-10-CM | POA: Diagnosis not present

## 2021-05-29 DIAGNOSIS — R739 Hyperglycemia, unspecified: Secondary | ICD-10-CM | POA: Diagnosis not present

## 2021-05-29 DIAGNOSIS — E1165 Type 2 diabetes mellitus with hyperglycemia: Secondary | ICD-10-CM | POA: Diagnosis not present

## 2021-06-03 DIAGNOSIS — K219 Gastro-esophageal reflux disease without esophagitis: Secondary | ICD-10-CM | POA: Diagnosis not present

## 2021-06-03 DIAGNOSIS — K8020 Calculus of gallbladder without cholecystitis without obstruction: Secondary | ICD-10-CM | POA: Diagnosis not present

## 2021-06-03 DIAGNOSIS — M545 Low back pain, unspecified: Secondary | ICD-10-CM | POA: Diagnosis not present

## 2021-06-03 DIAGNOSIS — F419 Anxiety disorder, unspecified: Secondary | ICD-10-CM | POA: Diagnosis not present

## 2021-06-03 DIAGNOSIS — R911 Solitary pulmonary nodule: Secondary | ICD-10-CM | POA: Diagnosis not present

## 2021-06-03 DIAGNOSIS — L438 Other lichen planus: Secondary | ICD-10-CM | POA: Diagnosis not present

## 2021-06-03 DIAGNOSIS — Z8781 Personal history of (healed) traumatic fracture: Secondary | ICD-10-CM | POA: Diagnosis not present

## 2021-06-03 DIAGNOSIS — R739 Hyperglycemia, unspecified: Secondary | ICD-10-CM | POA: Diagnosis not present

## 2021-08-26 DIAGNOSIS — I10 Essential (primary) hypertension: Secondary | ICD-10-CM | POA: Diagnosis not present

## 2021-08-26 DIAGNOSIS — R945 Abnormal results of liver function studies: Secondary | ICD-10-CM | POA: Diagnosis not present

## 2021-08-26 DIAGNOSIS — R748 Abnormal levels of other serum enzymes: Secondary | ICD-10-CM | POA: Diagnosis not present

## 2021-08-26 DIAGNOSIS — E1165 Type 2 diabetes mellitus with hyperglycemia: Secondary | ICD-10-CM | POA: Diagnosis not present

## 2021-08-27 DIAGNOSIS — E1165 Type 2 diabetes mellitus with hyperglycemia: Secondary | ICD-10-CM | POA: Diagnosis not present

## 2021-08-27 DIAGNOSIS — R945 Abnormal results of liver function studies: Secondary | ICD-10-CM | POA: Diagnosis not present

## 2021-08-27 DIAGNOSIS — R748 Abnormal levels of other serum enzymes: Secondary | ICD-10-CM | POA: Diagnosis not present

## 2021-08-27 DIAGNOSIS — I10 Essential (primary) hypertension: Secondary | ICD-10-CM | POA: Diagnosis not present

## 2021-09-02 DIAGNOSIS — D751 Secondary polycythemia: Secondary | ICD-10-CM | POA: Diagnosis not present

## 2021-09-02 DIAGNOSIS — L438 Other lichen planus: Secondary | ICD-10-CM | POA: Diagnosis not present

## 2021-09-02 DIAGNOSIS — R739 Hyperglycemia, unspecified: Secondary | ICD-10-CM | POA: Diagnosis not present

## 2021-09-02 DIAGNOSIS — K8020 Calculus of gallbladder without cholecystitis without obstruction: Secondary | ICD-10-CM | POA: Diagnosis not present

## 2021-09-02 DIAGNOSIS — F419 Anxiety disorder, unspecified: Secondary | ICD-10-CM | POA: Diagnosis not present

## 2021-09-02 DIAGNOSIS — Z8781 Personal history of (healed) traumatic fracture: Secondary | ICD-10-CM | POA: Diagnosis not present

## 2021-09-02 DIAGNOSIS — R911 Solitary pulmonary nodule: Secondary | ICD-10-CM | POA: Diagnosis not present

## 2021-09-02 DIAGNOSIS — S39012A Strain of muscle, fascia and tendon of lower back, initial encounter: Secondary | ICD-10-CM | POA: Diagnosis not present

## 2021-09-27 ENCOUNTER — Other Ambulatory Visit: Payer: Self-pay

## 2021-09-27 ENCOUNTER — Inpatient Hospital Stay (HOSPITAL_COMMUNITY): Payer: PPO | Attending: Hematology | Admitting: Hematology

## 2021-09-27 ENCOUNTER — Inpatient Hospital Stay (HOSPITAL_COMMUNITY): Payer: PPO

## 2021-09-27 VITALS — BP 133/87 | HR 66 | Temp 98.7°F | Resp 17 | Ht 68.0 in | Wt 231.5 lb

## 2021-09-27 DIAGNOSIS — Z808 Family history of malignant neoplasm of other organs or systems: Secondary | ICD-10-CM | POA: Insufficient documentation

## 2021-09-27 DIAGNOSIS — D696 Thrombocytopenia, unspecified: Secondary | ICD-10-CM | POA: Diagnosis not present

## 2021-09-27 DIAGNOSIS — R7989 Other specified abnormal findings of blood chemistry: Secondary | ICD-10-CM | POA: Insufficient documentation

## 2021-09-27 DIAGNOSIS — I10 Essential (primary) hypertension: Secondary | ICD-10-CM | POA: Insufficient documentation

## 2021-09-27 DIAGNOSIS — F17210 Nicotine dependence, cigarettes, uncomplicated: Secondary | ICD-10-CM | POA: Diagnosis not present

## 2021-09-27 DIAGNOSIS — R911 Solitary pulmonary nodule: Secondary | ICD-10-CM

## 2021-09-27 DIAGNOSIS — R918 Other nonspecific abnormal finding of lung field: Secondary | ICD-10-CM | POA: Diagnosis not present

## 2021-09-27 DIAGNOSIS — E119 Type 2 diabetes mellitus without complications: Secondary | ICD-10-CM | POA: Insufficient documentation

## 2021-09-27 DIAGNOSIS — D751 Secondary polycythemia: Secondary | ICD-10-CM | POA: Insufficient documentation

## 2021-09-27 LAB — URINALYSIS, ROUTINE W REFLEX MICROSCOPIC
Bilirubin Urine: NEGATIVE
Glucose, UA: NEGATIVE mg/dL
Hgb urine dipstick: NEGATIVE
Ketones, ur: NEGATIVE mg/dL
Leukocytes,Ua: NEGATIVE
Nitrite: POSITIVE — AB
Protein, ur: NEGATIVE mg/dL
Specific Gravity, Urine: 1.025 (ref 1.005–1.030)
pH: 5.5 (ref 5.0–8.0)

## 2021-09-27 LAB — CBC WITH DIFFERENTIAL/PLATELET
Abs Immature Granulocytes: 0.02 10*3/uL (ref 0.00–0.07)
Basophils Absolute: 0.1 10*3/uL (ref 0.0–0.1)
Basophils Relative: 1 %
Eosinophils Absolute: 0.2 10*3/uL (ref 0.0–0.5)
Eosinophils Relative: 4 %
HCT: 54.9 % — ABNORMAL HIGH (ref 39.0–52.0)
Hemoglobin: 19.6 g/dL — ABNORMAL HIGH (ref 13.0–17.0)
Immature Granulocytes: 0 %
Lymphocytes Relative: 31 %
Lymphs Abs: 1.5 10*3/uL (ref 0.7–4.0)
MCH: 33.6 pg (ref 26.0–34.0)
MCHC: 35.7 g/dL (ref 30.0–36.0)
MCV: 94 fL (ref 80.0–100.0)
Monocytes Absolute: 0.4 10*3/uL (ref 0.1–1.0)
Monocytes Relative: 9 %
Neutro Abs: 2.7 10*3/uL (ref 1.7–7.7)
Neutrophils Relative %: 55 %
Platelets: 126 10*3/uL — ABNORMAL LOW (ref 150–400)
RBC: 5.84 MIL/uL — ABNORMAL HIGH (ref 4.22–5.81)
RDW: 12.7 % (ref 11.5–15.5)
WBC: 4.9 10*3/uL (ref 4.0–10.5)
nRBC: 0 % (ref 0.0–0.2)

## 2021-09-27 LAB — URINALYSIS, MICROSCOPIC (REFLEX)
RBC / HPF: NONE SEEN RBC/hpf (ref 0–5)
WBC, UA: NONE SEEN WBC/hpf (ref 0–5)

## 2021-09-27 LAB — IRON AND TIBC
Iron: 131 ug/dL (ref 45–182)
Saturation Ratios: 38 % (ref 17.9–39.5)
TIBC: 349 ug/dL (ref 250–450)
UIBC: 218 ug/dL

## 2021-09-27 LAB — LACTATE DEHYDROGENASE: LDH: 141 U/L (ref 98–192)

## 2021-09-27 LAB — FERRITIN: Ferritin: 399 ng/mL — ABNORMAL HIGH (ref 24–336)

## 2021-09-27 NOTE — Patient Instructions (Addendum)
[place] at [facility] Discharge Instructions  You were seen and examined today by Dr. Delton Coombes. Dr. Delton Coombes is a hematologist, meaning that he specializes in blood abnormalities. Dr. Delton Coombes discussed your past medical history, family history of cancers/blood conditions and the events that led to you being here today.   You were referred to Dr. Delton Coombes due to erythrocytosis (elevated hemoglobin). You were previously seen for this by another provider. Dr. Delton Coombes has recommended additional lab work today as well as a urine sample.  Follow-up as scheduled.   Thank you for choosing [place] at [facility] to provide your oncology and hematology care.  To afford each patient quality time with our provider, please arrive at least 15 minutes before your scheduled appointment time.   If you have a lab appointment with the [facility] please come in thru the Main Entrance and check in at the main information desk.  You need to re-schedule your appointment should you arrive 10 or more minutes late.  We strive to give you quality time with our providers, and arriving late affects you and other patients whose appointments are after yours.  Also, if you no show three or more times for appointments you may be dismissed from the clinic at the providers discretion.     Again, thank you for choosing [facility].  Our hope is that these requests will decrease the amount of time that you wait before being seen by our physicians.       _____________________________________________________________  Should you have questions after your visit to [facility], please contact our office at [phone] and follow the prompts.  Our office hours are 8:00 a.m. and 4:30 p.m. Monday - Friday.  Please note that voicemails left after 4:00 p.m. may not be returned until the following business day.  We are closed weekends and major  holidays.  You do have access to a nurse 24-7, just call the main number to the clinic [phone] and do not press any options, hold on the line and a nurse will answer the phone.    For prescription refill requests, have your pharmacy contact our office and allow 72 hours.    Due to Covid, you will need to wear a mask upon entering the hospital. If you do not have a mask, a mask will be given to you at the Main Entrance upon arrival. For doctor visits, patients may have 1 support person age 14 or older with them. For treatment visits, patients can not have anyone with them due to social distancing guidelines and our immunocompromised population.
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
## 2021-09-28 LAB — ERYTHROPOIETIN: Erythropoietin: 7.3 m[IU]/mL (ref 2.6–18.5)

## 2021-10-02 LAB — JAK2 GENOTYPR

## 2021-10-02 NOTE — Progress Notes (Signed)
Reviewed at ov on 09/27/21 by Dr. Delton Coombes

## 2021-10-09 ENCOUNTER — Other Ambulatory Visit: Payer: Self-pay

## 2021-10-09 ENCOUNTER — Ambulatory Visit (HOSPITAL_COMMUNITY)
Admission: RE | Admit: 2021-10-09 | Discharge: 2021-10-09 | Disposition: A | Discharge status: Home / Self Care | Payer: PPO | Source: Ambulatory Visit | Attending: Hematology | Admitting: Hematology

## 2021-10-09 DIAGNOSIS — R918 Other nonspecific abnormal finding of lung field: Secondary | ICD-10-CM | POA: Diagnosis not present

## 2021-10-09 DIAGNOSIS — R911 Solitary pulmonary nodule: Secondary | ICD-10-CM | POA: Insufficient documentation

## 2021-10-09 DIAGNOSIS — R7989 Other specified abnormal findings of blood chemistry: Secondary | ICD-10-CM | POA: Insufficient documentation

## 2021-10-09 DIAGNOSIS — D751 Secondary polycythemia: Secondary | ICD-10-CM | POA: Insufficient documentation

## 2021-10-09 DIAGNOSIS — I700 Atherosclerosis of aorta: Secondary | ICD-10-CM | POA: Diagnosis not present

## 2021-10-14 ENCOUNTER — Inpatient Hospital Stay (HOSPITAL_COMMUNITY): Payer: PPO | Admitting: Hematology

## 2021-10-14 ENCOUNTER — Other Ambulatory Visit (HOSPITAL_COMMUNITY): Payer: Self-pay | Admitting: *Deleted

## 2021-10-14 ENCOUNTER — Other Ambulatory Visit: Payer: Self-pay

## 2021-10-14 VITALS — BP 147/100 | HR 66 | Temp 98.2°F | Resp 20 | Ht 68.7 in | Wt 233.2 lb

## 2021-10-14 DIAGNOSIS — R7989 Other specified abnormal findings of blood chemistry: Secondary | ICD-10-CM | POA: Diagnosis not present

## 2021-10-14 DIAGNOSIS — D751 Secondary polycythemia: Secondary | ICD-10-CM

## 2021-10-14 NOTE — Patient Instructions (Addendum)
[place] at [facility] Discharge Instructions   You were seen and examined today by Dr. Delton Coombes.  We will schedule you for phlebotomies every 2 weeks as your hemoglobin was 19.6.  We will schedule you for a bone marrow biopsy, as the blood work we did did not show any reason for your blood to be elevated.  Return as scheduled.    Thank you for choosing [place] at [facility] to provide your oncology and hematology care.  To afford each patient quality time with our provider, please arrive at least 15 minutes before your scheduled appointment time.   If you have a lab appointment with the [facility] please come in thru the Main Entrance and check in at the main information desk.  You need to re-schedule your appointment should you arrive 10 or more minutes late.  We strive to give you quality time with our providers, and arriving late affects you and other patients whose appointments are after yours.  Also, if you no show three or more times for appointments you may be dismissed from the clinic at the providers discretion.     Again, thank you for choosing [facility]  Our hope is that these requests will decrease the amount of time that you wait before being seen by our physicians.       _____________________________________________________________  Should you have questions after your visit to [facility], please contact our office at [phone] and follow the prompts.  Our office hours are 8:00 a.m. and 4:30 p.m. Monday - Friday.  Please note that voicemails left after 4:00 p.m. may not be returned until the following business day.  We are closed weekends and major holidays.  You do have access to a nurse 24-7, just call the main number to the clinic [phone] and do not press any options, hold on the line and a nurse will answer the phone.    For prescription refill requests, have your pharmacy  contact our office and allow 72 hours.    Due to Covid, you will need to wear a mask upon entering the hospital. If you do not have a mask, a mask will be given to you at the Main Entrance upon arrival. For doctor visits, patients may have 1 support person age 88 or older with them. For treatment visits, patients can not have anyone with them due to social distancing guidelines and our immunocompromised population.

## 2021-10-14 NOTE — Progress Notes (Signed)
[name], [place] [address]   CLINIC:  Medical Oncology/Hematology  PCP:  Ricardo Bis, MD [address] [name] [address]  [phone]  REASON FOR VISIT:  Follow-up for erythrocytosis and elevated ferritin  PRIOR THERAPY: Phlebotomy x1 (03/29/2021)  CURRENT THERAPY: under work-up  INTERVAL HISTORY:  [name], a 61 y.o. male, returns for routine follow-up for his erythrocytosis and elevated ferritin. [name] was last seen on 09/27/2021.  Today he reports feeling good. He reports he smokes occasionally, but he denies smoking daily.   REVIEW OF SYSTEMS:  Review of Systems  Constitutional:  Negative for appetite change and fatigue.  Respiratory:  Positive for cough.   Musculoskeletal:  Positive for back pain (3/10).  All other systems reviewed and are negative.  PAST MEDICAL/SURGICAL HISTORY:  Past Medical History:  Diagnosis Date   Alcoholism ([place])    Anxiety    Arthritis    Depression    Diabetes ([place])    Gallstones    GERD (gastroesophageal reflux disease)    Hypertension    Kidney stones    Obesity    Past Surgical History:  Procedure Laterality Date   ANKLE SURGERY     left   KNEE SURGERY     right   LEG SURGERY     bilateral    SOCIAL HISTORY:  Social History   Socioeconomic History   Marital status: Married    Spouse name: Not on file   Number of children: Not on file   Years of education: Not on file   Highest education level: Not on file  Occupational History   Not on file  Tobacco Use   Smoking status: Some Days    Packs/day: 2.00    Years: 20.00    Pack years: 40.00    Types: Cigarettes    Last attempt to quit: 08/19/2012    Years since quitting: 9.1   Smokeless tobacco: Never  Substance and Sexual Activity   Alcohol use: Yes    Comment: occasionally; officially "quit" 6 weeks ago   Drug use: No   Sexual activity: Not on file  Other Topics Concern   Not on file  Social History  Narrative   Not on file   Social Determinants of Health   Financial Resource Strain: Not on file  Food Insecurity: Not on file  Transportation Needs: Not on file  Physical Activity: Not on file  Stress: Not on file  Social Connections: Not on file  Intimate Partner Violence: Not on file    FAMILY HISTORY:  Family History  Problem Relation Age of Onset   Liver cancer Mother    Cancer Unknown        mult uncles   Cancer Sister    Colon cancer Neg Hx    Stomach cancer Neg Hx     CURRENT MEDICATIONS:  Current Outpatient Medications  Medication Sig Dispense Refill   ALPRAZolam (XANAX) 0.5 MG tablet Take 0.5 mg by mouth at bedtime as needed for anxiety.     lisinopril (PRINIVIL,ZESTRIL) 5 MG tablet Take 5 mg by mouth daily.     Multiple Vitamins-Minerals (MULTIVITAMIN WITH MINERALS) tablet Take 1 tablet by mouth daily.     omeprazole (PRILOSEC) 20 MG capsule Take 20 mg by mouth daily.     PRESCRIPTION MEDICATION      No current facility-administered medications for this visit.    ALLERGIES:  Allergies  Allergen Reactions  Clindamycin/Lincomycin Rash   Penicillins Nausea And Vomiting and Other (See Comments)    Beak out in "sweats"    PHYSICAL EXAM:  Performance status (ECOG): 1 - Symptomatic but completely ambulatory  There were no vitals filed for this visit. Wt Readings from Last 3 Encounters:  09/27/21 231 lb 8 oz (105 kg)  09/13/13 (!) 317 lb (143.8 kg)  07/26/13 (!) 317 lb (143.8 kg)   Physical Exam Vitals reviewed.  Constitutional:      Appearance: Normal appearance.  Cardiovascular:     Rate and Rhythm: Normal rate and regular rhythm.     Pulses: Normal pulses.     Heart sounds: Normal heart sounds.  Pulmonary:     Effort: Pulmonary effort is normal.     Breath sounds: Normal breath sounds.  Neurological:     General: No focal deficit present.     Mental Status: He is alert and oriented to person, place, and time.  Psychiatric:        Mood and  Affect: Mood normal.        Behavior: Behavior normal.    LABORATORY DATA:  I have reviewed the labs as listed.  CBC Latest Ref Rng & Units 09/27/2021 09/12/2010 09/10/2010  WBC 4.0 - 10.5 K/uL 4.9 8.8 12.6(H)  Hemoglobin 13.0 - 17.0 g/dL 19.6(H) 14.6 16.3  Hematocrit 39.0 - 52.0 % 54.9(H) 39.7 43.6  Platelets 150 - 400 K/uL 126(L) 104 DELTA CHECK NOTED(L) 178   CMP Latest Ref Rng & Units 09/12/2010 09/10/2010 03/08/2008  Glucose 70 - 99 mg/dL 140(H) 161(H) 88  BUN 6 - 23 mg/dL _0 Creatinine 0.4 - 1.5 mg/dL 1.21 1.11 0.86  Sodium 135 - 145 mEq/L 133(L) 137 135  Potassium 3.5 - 5.1 mEq/L 3.4(L) 4.0 4.0  Chloride 96 - 112 mEq/L 103 106 104  CO2 19 - 32 mEq/L _1 Calcium 8.4 - 10.5 mg/dL 8.7 9.1 8.1(L)  Total Protein - - - -  Total Bilirubin - - - -  Alkaline Phos - - - -  AST - - - -  ALT - - - -      Component Value Date/Time   RBC 5.84 (H) 09/27/2021 0928   MCV 94.0 09/27/2021 0928   MCH 33.6 09/27/2021 0928   MCHC 35.7 09/27/2021 0928   RDW 12.7 09/27/2021 0928   LYMPHSABS 1.5 09/27/2021 0928   MONOABS 0.4 09/27/2021 0928   EOSABS 0.2 09/27/2021 0928   BASOSABS 0.1 09/27/2021 0928    DIAGNOSTIC IMAGING:  I have independently reviewed the scans and discussed with the patient. CT Chest Wo Contrast  Result Date: 10/11/2021 CLINICAL DATA:  Follow-up pulmonary nodule EXAM: CT CHEST WITHOUT CONTRAST TECHNIQUE: Multidetector CT imaging of the chest was performed following the standard protocol without IV contrast. RADIATION DOSE REDUCTION: This exam was performed according to the departmental dose-optimization program which includes automated exposure control, adjustment of the mA and/or kV according to patient size and/or use of iterative reconstruction technique. COMPARISON:  04/10/2021 FINDINGS: Cardiovascular: Heart size is normal. No pericardial effusion. Aortic atherosclerosis and coronary artery calcifications. No pericardial effusion. Mediastinum/Nodes: No enlarged  mediastinal or axillary lymph nodes. Thyroid gland, trachea, and esophagus demonstrate no significant findings. Lungs/Pleura: No pleural effusion, airspace consolidation, or pneumothorax. Stable 7 mm nodule in the posterior right lower lobe, image 85/4. (New on recent prior exam from 04/10/2021.) 3 mm anterior right upper lobe lung nodule is stable, image 85/4. (Also new on recent prior exam  from 04/10/2021) Stable adjacent 4 mm anterior right upper lobe pulmonary nodules, image 65/4 and image 66/4.). (Stable since 2009 compatible with a benign process). Calcified granuloma is again noted in the lateral left lung base. No new or suspicious lung nodules. Upper Abdomen: No acute abnormality. The liver has a coarsened/nodular contour with relative hypertrophy of the lateral segment of left hepatic lobe. Multiple small calcified gallstones are again seen. Musculoskeletal: Spondylosis identified within the thoracic spine. No acute or suspicious osseous findings. IMPRESSION: 1. Stable right lung nodules measuring up to 7 mm. Future CT at 18-24 months (from today's scan) is considered optional for low-risk patients, but is recommended for high-risk patients. This recommendation follows the consensus statement: Guidelines for Management of Incidental Pulmonary Nodules Detected on CT Images: From the Fleischner Society 2017; Radiology 2017; 284:228-243. 2. Morphologic features of the liver suggestive of cirrhosis. 3. Gallstones. 4. Coronary artery calcifications. 5. Aortic Atherosclerosis (ICD10-I70.0). Electronically Signed   By: Kerby Moors M.D.   On: 10/11/2021 06:52     ASSESSMENT:  Erythrocytosis: - Patient evaluated for erythrocytosis at [facility] in July 2022. - CBC on 03/18/2021 showed hemoglobin 18.7, hematocrit 51. - He underwent phlebotomy on 03/29/2021.  He reported improvement in energy levels which lasted 2 weeks. - CBC on 04/10/2021 showed hemoglobin 17.4 and hematocrit 47. - Testing for JAK2 exon 12/14,  CALR exon 9 and MPL exon 10 was negative. - Denies any testosterone supplements. - Denies any fevers or night sweats.  Reports 70 to 80 pound weight loss in the last 2 years after changing diet and starting exercise. - Denies any aquagenic pruritus or vasomotor symptoms.  No headaches or vision changes. - No clinical signs or symptoms of OSA. - CBC on 08/27/2021 at [facility] office shows hemoglobin 19.3, hematocrit 55.1.  Platelet count was 131.    Elevated ferritin - Labs on 03/18/2021 with ferritin 648.  Saturation 30.  Hepatitis C antibody negative. - Hereditary hemochromatosis assay for C282Y and H63D negative. - CT chest and abdomen on 04/10/2021 with subtle nodular configuration of liver contour suspicious of cirrhosis.  Spleen reported as normal size. - Ultrasound abdomen on 03/08/2021 showed increased echogenicity in the liver thought to be secondary to hepatic steatosis.    Social/family history: - He is currently disabled.  Worked at [employer].  Lives at home with his wife. - He smokes sporadically.  He quit smoking regularly about 2 to 3 years ago.  Smoked 2 packs/day for 35 years. - He drinks 7-8 beers per night. - No family history of polycythemia or hemochromatosis.  Mother died of liver cancer.  2 maternal uncles died of liver cancer.  1 maternal aunt died of cancer.    PLAN:  JAK2 V617F negative erythrocytosis:: -We reviewed results of blood work from 09/27/2021 which showed negative mutation for JAK2 V617F.  Serum EPO was 7.3. - Hemoglobin was 19.6, hematocrit 54.9 and RBC 5.84. - Recommend phlebotomy every 2 weeks as he noted improvement in energy levels after the last phlebotomy in August last year. - I have recommended bone marrow aspiration and biopsy to further evaluate erythrocytosis. - RTC 2 weeks after the bone marrow biopsy.    Elevated ferritin: -Labs on 09/27/2021 shows ferritin 399, improved from previous value. - Previous testing for hemochromatosis was  negative.  Most likely reason for elevated ferritin is underlying liver disease.  3.  Lung nodules: -CT scan on 04/10/2021 showed 7 mm posterior right lower lobe lung nodule and 3 mm anterior  right upper lobe lung nodule. - We reviewed results of CT scan of the chest from 10/11/2021 which showed stable right lung nodules.  Other findings were discussed. - He will need follow-up CT chest in 2 years.  Orders placed this encounter:  No orders of the defined types were placed in this encounter.    Derek Jack, MD [place] [phone]   I, Thana Ates, am acting as a scribe for Dr. Derek Jack.  I, Derek Jack MD, have reviewed the above documentation for accuracy and completeness, and I agree with the above.

## 2021-10-16 ENCOUNTER — Encounter (HOSPITAL_COMMUNITY): Payer: Self-pay

## 2021-10-16 ENCOUNTER — Inpatient Hospital Stay (HOSPITAL_COMMUNITY): Payer: PPO

## 2021-10-16 ENCOUNTER — Other Ambulatory Visit: Payer: Self-pay

## 2021-10-16 DIAGNOSIS — D751 Secondary polycythemia: Secondary | ICD-10-CM | POA: Diagnosis not present

## 2021-10-16 NOTE — Progress Notes (Signed)
[name] presents for therapeutic phlebotomy per MD orders. Last HGB 19.6 / HCT 54.9 on 09/27/2021 . Vital signs stable prior to procedure. Procedure started at 14:36 pm and ended at 14:42 pm. 500 mls of blood removed. Patient denies any dizziness , lightheadedness, or feeling faint.  Gauze and coban applied to site. Vital signs stable at completion of procedure. Patient has no complaints at this time. Alert and oriented x 3. Discharged in stable condition.

## 2021-10-16 NOTE — Patient Instructions (Signed)
[facility]  Discharge Instructions: Thank you for choosing [facility] to provide your oncology and hematology care.  If you have a lab appointment with the [facility], please come in thru the Main Entrance and check in at the main information desk.  Wear comfortable clothing and clothing appropriate for easy access to any Portacath or PICC line.   We strive to give you quality time with your provider. You may need to reschedule your appointment if you arrive late (15 or more minutes).  Arriving late affects you and other patients whose appointments are after yours.  Also, if you miss three or more appointments without notifying the office, you may be dismissed from the clinic at the providers discretion.      For prescription refill requests, have your pharmacy contact our office and allow 72 hours for refills to be completed.    Today you received a therapeutic phlebotomy.       To help prevent nausea and vomiting after your treatment, we encourage you to take your nausea medication as directed.  BELOW ARE SYMPTOMS THAT SHOULD BE REPORTED IMMEDIATELY: *FEVER GREATER THAN 100.4 F (38 C) OR HIGHER *CHILLS OR SWEATING *NAUSEA AND VOMITING THAT IS NOT CONTROLLED WITH YOUR NAUSEA MEDICATION *UNUSUAL SHORTNESS OF BREATH *UNUSUAL BRUISING OR BLEEDING *URINARY PROBLEMS (pain or burning when urinating, or frequent urination) *BOWEL PROBLEMS (unusual diarrhea, constipation, pain near the anus) TENDERNESS IN MOUTH AND THROAT WITH OR WITHOUT PRESENCE OF ULCERS (sore throat, sores in mouth, or a toothache) UNUSUAL RASH, SWELLING OR PAIN  UNUSUAL VAGINAL DISCHARGE OR ITCHING   Items with * indicate a potential emergency and should be followed up as soon as possible or go to the Emergency Department if any problems should occur.  Please show the CHEMOTHERAPY ALERT CARD or IMMUNOTHERAPY ALERT CARD at check-in to the Emergency Department and triage nurse.  Should you  have questions after your visit or need to cancel or reschedule your appointment, please contact [facility] [phone]  and follow the prompts.  Office hours are 8:00 a.m. to 4:30 p.m. Monday - Friday. Please note that voicemails left after 4:00 p.m. may not be returned until the following business day.  We are closed weekends and major holidays. You have access to a nurse at all times for urgent questions. Please call the main number to the clinic [phone] and follow the prompts.  For any non-urgent questions, you may also contact your provider using MyChart. We now offer e-Visits for anyone 21 and older to request care online for non-urgent symptoms. For details visit [email or web address].   Also download the MyChart app! Go to the app store, search "MyChart", open the app, select [facility], and log in with your MyChart username and password.  Due to Covid, a mask is required upon entering the hospital/clinic. If you do not have a mask, one will be given to you upon arrival. For doctor visits, patients may have 1 support person aged 10 or older with them. For treatment visits, patients cannot have anyone with them due to current Covid guidelines and our immunocompromised population.

## 2021-10-30 ENCOUNTER — Other Ambulatory Visit: Payer: Self-pay

## 2021-10-30 ENCOUNTER — Inpatient Hospital Stay (HOSPITAL_COMMUNITY): Payer: PPO | Attending: Hematology

## 2021-10-30 ENCOUNTER — Encounter (HOSPITAL_COMMUNITY): Payer: Self-pay

## 2021-10-30 DIAGNOSIS — R7989 Other specified abnormal findings of blood chemistry: Secondary | ICD-10-CM | POA: Insufficient documentation

## 2021-10-30 DIAGNOSIS — D751 Secondary polycythemia: Secondary | ICD-10-CM | POA: Insufficient documentation

## 2021-10-30 NOTE — Progress Notes (Signed)
Janean Sark presents today for phlebotomy per MD orders. ?Phlebotomy procedure started at 1424 and ended at 1421. ?500 mls removed. ?Patient did not want to stay for observation. Patient states he feels fine and was discharged ambulatory form clinic. ?Patient tolerated procedure well. ?IV needle removed intact. ? ? ? ?

## 2021-10-30 NOTE — Patient Instructions (Signed)
[facility]  Discharge Instructions: Thank you for choosing [facility] to provide your oncology and hematology care.  If you have a lab appointment with the Cancer Center, please come in thru the Main Entrance and check in at the main information desk.  Wear comfortable clothing and clothing appropriate for easy access to any Portacath or PICC line.   We strive to give you quality time with your provider. You may need to reschedule your appointment if you arrive late (15 or more minutes).  Arriving late affects you and other patients whose appointments are after yours.  Also, if you miss three or more appointments without notifying the office, you may be dismissed from the clinic at the provider's discretion.      For prescription refill requests, have your pharmacy contact our office and allow 72 hours for refills to be completed.        To help prevent nausea and vomiting after your treatment, we encourage you to take your nausea medication as directed.  BELOW ARE SYMPTOMS THAT SHOULD BE REPORTED IMMEDIATELY: *FEVER GREATER THAN 100.4 F (38 C) OR HIGHER *CHILLS OR SWEATING *NAUSEA AND VOMITING THAT IS NOT CONTROLLED WITH YOUR NAUSEA MEDICATION *UNUSUAL SHORTNESS OF BREATH *UNUSUAL BRUISING OR BLEEDING *URINARY PROBLEMS (pain or burning when urinating, or frequent urination) *BOWEL PROBLEMS (unusual diarrhea, constipation, pain near the anus) TENDERNESS IN MOUTH AND THROAT WITH OR WITHOUT PRESENCE OF ULCERS (sore throat, sores in mouth, or a toothache) UNUSUAL RASH, SWELLING OR PAIN  UNUSUAL VAGINAL DISCHARGE OR ITCHING   Items with * indicate a potential emergency and should be followed up as soon as possible or go to the Emergency Department if any problems should occur.  Please show the CHEMOTHERAPY ALERT CARD or IMMUNOTHERAPY ALERT CARD at check-in to the Emergency Department and triage nurse.  Should you have questions after your visit or need to cancel  or reschedule your appointment, please contact [facility] [phone]  and follow the prompts.  Office hours are 8:00 a.m. to 4:30 p.m. Monday - Friday. Please note that voicemails left after 4:00 p.m. may not be returned until the following business day.  We are closed weekends and major holidays. You have access to a nurse at all times for urgent questions. Please call the main number to the clinic [phone] and follow the prompts.  For any non-urgent questions, you may also contact your provider using MyChart. We now offer e-Visits for anyone 18 and older to request care online for non-urgent symptoms. For details visit [email or web address].   Also download the MyChart app! Go to the app store, search "MyChart", open the app, select [facility], and log in with your MyChart username and password.  Due to Covid, a mask is required upon entering the hospital/clinic. If you do not have a mask, one will be given to you upon arrival. For doctor visits, patients may have 1 support person aged 18 or older with them. For treatment visits, patients cannot have anyone with them due to current Covid guidelines and our immunocompromised population.  

## 2021-10-31 ENCOUNTER — Ambulatory Visit (HOSPITAL_COMMUNITY): Payer: PPO

## 2021-11-13 ENCOUNTER — Other Ambulatory Visit: Payer: Self-pay | Admitting: Student

## 2021-11-14 ENCOUNTER — Other Ambulatory Visit: Payer: Self-pay

## 2021-11-14 ENCOUNTER — Ambulatory Visit (HOSPITAL_COMMUNITY)
Admission: RE | Admit: 2021-11-14 | Discharge: 2021-11-14 | Disposition: A | Discharge status: Home / Self Care | Payer: PPO | Source: Ambulatory Visit | Attending: Hematology | Admitting: Hematology

## 2021-11-14 ENCOUNTER — Encounter (HOSPITAL_COMMUNITY): Payer: Self-pay

## 2021-11-14 DIAGNOSIS — Z87442 Personal history of urinary calculi: Secondary | ICD-10-CM | POA: Insufficient documentation

## 2021-11-14 DIAGNOSIS — K219 Gastro-esophageal reflux disease without esophagitis: Secondary | ICD-10-CM | POA: Diagnosis not present

## 2021-11-14 DIAGNOSIS — M545 Low back pain, unspecified: Secondary | ICD-10-CM | POA: Insufficient documentation

## 2021-11-14 DIAGNOSIS — G8929 Other chronic pain: Secondary | ICD-10-CM | POA: Insufficient documentation

## 2021-11-14 DIAGNOSIS — F17210 Nicotine dependence, cigarettes, uncomplicated: Secondary | ICD-10-CM | POA: Diagnosis not present

## 2021-11-14 DIAGNOSIS — K8020 Calculus of gallbladder without cholecystitis without obstruction: Secondary | ICD-10-CM | POA: Insufficient documentation

## 2021-11-14 DIAGNOSIS — M1990 Unspecified osteoarthritis, unspecified site: Secondary | ICD-10-CM | POA: Diagnosis not present

## 2021-11-14 DIAGNOSIS — F419 Anxiety disorder, unspecified: Secondary | ICD-10-CM | POA: Insufficient documentation

## 2021-11-14 DIAGNOSIS — I10 Essential (primary) hypertension: Secondary | ICD-10-CM | POA: Diagnosis not present

## 2021-11-14 DIAGNOSIS — D696 Thrombocytopenia, unspecified: Secondary | ICD-10-CM | POA: Insufficient documentation

## 2021-11-14 DIAGNOSIS — F32A Depression, unspecified: Secondary | ICD-10-CM | POA: Insufficient documentation

## 2021-11-14 DIAGNOSIS — E669 Obesity, unspecified: Secondary | ICD-10-CM | POA: Insufficient documentation

## 2021-11-14 DIAGNOSIS — D750 Familial erythrocytosis: Secondary | ICD-10-CM | POA: Diagnosis not present

## 2021-11-14 DIAGNOSIS — E119 Type 2 diabetes mellitus without complications: Secondary | ICD-10-CM | POA: Diagnosis not present

## 2021-11-14 DIAGNOSIS — D751 Secondary polycythemia: Secondary | ICD-10-CM | POA: Diagnosis not present

## 2021-11-14 DIAGNOSIS — F1021 Alcohol dependence, in remission: Secondary | ICD-10-CM | POA: Diagnosis not present

## 2021-11-14 DIAGNOSIS — R7989 Other specified abnormal findings of blood chemistry: Secondary | ICD-10-CM | POA: Insufficient documentation

## 2021-11-14 LAB — CBC WITH DIFFERENTIAL/PLATELET
Abs Immature Granulocytes: 0.01 10*3/uL (ref 0.00–0.07)
Basophils Absolute: 0.1 10*3/uL (ref 0.0–0.1)
Basophils Relative: 1 %
Eosinophils Absolute: 0.3 10*3/uL (ref 0.0–0.5)
Eosinophils Relative: 5 %
HCT: 47.8 % (ref 39.0–52.0)
Hemoglobin: 17.7 g/dL — ABNORMAL HIGH (ref 13.0–17.0)
Immature Granulocytes: 0 %
Lymphocytes Relative: 31 %
Lymphs Abs: 1.8 10*3/uL (ref 0.7–4.0)
MCH: 34.3 pg — ABNORMAL HIGH (ref 26.0–34.0)
MCHC: 37 g/dL — ABNORMAL HIGH (ref 30.0–36.0)
MCV: 92.6 fL (ref 80.0–100.0)
Monocytes Absolute: 0.5 10*3/uL (ref 0.1–1.0)
Monocytes Relative: 8 %
Neutro Abs: 3 10*3/uL (ref 1.7–7.7)
Neutrophils Relative %: 55 %
Platelets: 141 10*3/uL — ABNORMAL LOW (ref 150–400)
RBC: 5.16 MIL/uL (ref 4.22–5.81)
RDW: 12.3 % (ref 11.5–15.5)
WBC: 5.6 10*3/uL (ref 4.0–10.5)
nRBC: 0 % (ref 0.0–0.2)

## 2021-11-14 MED ORDER — NALOXONE HCL 0.4 MG/ML IJ SOLN
INTRAMUSCULAR | Status: AC
  Filled 2021-11-14: qty 1

## 2021-11-14 MED ORDER — FENTANYL CITRATE (PF) 100 MCG/2ML IJ SOLN
INTRAMUSCULAR | Status: AC | PRN
  Administered 2021-11-14: 50 ug via INTRAVENOUS

## 2021-11-14 MED ORDER — MIDAZOLAM HCL 2 MG/2ML IJ SOLN
INTRAMUSCULAR | Status: AC | PRN
Start: 2021-11-14 — End: 2021-11-14
  Administered 2021-11-14: 2 mg via INTRAVENOUS

## 2021-11-14 MED ORDER — MIDAZOLAM HCL 2 MG/2ML IJ SOLN
INTRAMUSCULAR | Status: AC | PRN
Start: 2021-11-14 — End: 2021-11-14
  Administered 2021-11-14: 1 mg via INTRAVENOUS

## 2021-11-14 MED ORDER — FENTANYL CITRATE (PF) 100 MCG/2ML IJ SOLN
INTRAMUSCULAR | Status: AC
  Filled 2021-11-14: qty 2

## 2021-11-14 MED ORDER — FLUMAZENIL 0.5 MG/5ML IV SOLN
INTRAVENOUS | Status: AC
  Filled 2021-11-14: qty 5

## 2021-11-14 MED ORDER — SODIUM CHLORIDE 0.9 % IV SOLN: INTRAVENOUS | Status: DC

## 2021-11-14 MED ORDER — MIDAZOLAM HCL 2 MG/2ML IJ SOLN
INTRAMUSCULAR | Status: DC | PRN
  Administered 2021-11-14: 1 mg via INTRAVENOUS

## 2021-11-14 MED ORDER — MIDAZOLAM HCL 2 MG/2ML IJ SOLN
INTRAMUSCULAR | Status: AC
  Filled 2021-11-14: qty 4

## 2021-11-14 NOTE — Procedures (Signed)
Interventional Radiology Procedure: ? ? ?Indications: Erythrocytosis and elevated ferritin ? ?Procedure: CT guided bone marrow biopsy ? ?Findings: 2 aspirates and 2 cores from right ilium ? ?Complications: None ?    ?EBL: Minimal, less than 10 ml ? ?Plan: Discharge to home in one hour. ? ? ?Shakala Marlatt R. Anselm Pancoast, MD  ?Pager: [phone] ? ?  ?

## 2021-11-14 NOTE — Discharge Instructions (Signed)
You may remove the dressing to your lower back in 24 hours, 1130 March 24,Friday.  You do not have to place another dressing over the site unless you desire to.  You may take a shower after 24 hours or after you remove the dressing.   ?

## 2021-11-14 NOTE — Consult Note (Signed)
? ?Chief Complaint: ?Patient was seen in consultation today for CT-guided bone marrow biopsy ? ?Referring Physician(s): ?Derek Jack ? ?Supervising Physician: Markus Daft ? ?Patient Status: [name] ? ?History of Present Illness: ?[name] is a 61 y.o. male smoker  with past medical history of alcoholism, anxiety/depression, arthritis, diabetes, cholelithiasis, GERD, hypertension, nephrolithiasis, obesity and now with erythrocytosis and elevated ferritin.  He presents today for CT-guided bone marrow biopsy for further evaluation. ? ?Past Medical History:  ?Diagnosis Date  ? Alcoholism ([place])   ? Anxiety   ? Arthritis   ? Depression   ? Diabetes ([place])   ? Gallstones   ? GERD (gastroesophageal reflux disease)   ? Hypertension   ? Kidney stones   ? Obesity   ? ? ?Past Surgical History:  ?Procedure Laterality Date  ? ANKLE SURGERY    ? left  ? KNEE SURGERY    ? right  ? LEG SURGERY    ? bilateral  ? ? ?Allergies: ?Clindamycin/lincomycin and Penicillins ? ?Medications: ?Prior to Admission medications   ?Medication Sig Start Date End Date Taking? Authorizing Provider  ?ALPRAZolam (XANAX) 1 MG tablet Take 1 mg by mouth 3 (three) times daily. 10/02/21  Yes [provider]  ?lisinopril (PRINIVIL,ZESTRIL) 5 MG tablet Take 5 mg by mouth daily. 07/22/17 11/14/21 Yes [provider]  ?Multiple Vitamins-Minerals (MULTIVITAMIN WITH MINERALS) tablet Take 1 tablet by mouth daily.   Yes [provider]  ?omeprazole (PRILOSEC) 20 MG capsule Take 20 mg by mouth daily. 07/22/17 11/14/21 Yes [provider]  ?[place]    Yes [provider]  ?ibuprofen (ADVIL) 800 MG tablet Take 800 mg by mouth 2 (two) times daily. 09/13/21   [provider]  ?  ? ?Family History  ?Problem Relation Age of Onset  ? Liver cancer Mother   ? Cancer Unknown   ?     mult uncles  ? Cancer Sister   ? Colon cancer Neg Hx   ? Stomach cancer Neg Hx   ? ? ?Social History   ? ?Socioeconomic History  ? Marital status: Married  ?  Spouse name: Not on file  ? Number of children: Not on file  ? Years of education: Not on file  ? Highest education level: Not on file  ?Occupational History  ? Not on file  ?Tobacco Use  ? Smoking status: Some Days  ?  Packs/day: 2.00  ?  Years: 20.00  ?  Pack years: 40.00  ?  Types: Cigarettes  ?  Last attempt to quit: 08/19/2012  ?  Years since quitting: 9.2  ? Smokeless tobacco: Never  ?Substance and Sexual Activity  ? Alcohol use: Yes  ?  Comment: occasionally; officially "quit" 6 weeks ago  ? Drug use: No  ? Sexual activity: Not on file  ?Other Topics Concern  ? Not on file  ?Social History Narrative  ? Not on file  ? ?Social Determinants of Health  ? ?Financial Resource Strain: Not on file  ?Food Insecurity: Not on file  ?Transportation Needs: Not on file  ?Physical Activity: Not on file  ?Stress: Not on file  ?Social Connections: Not on file  ? ? ? ? ?Review of Systems currently denies fever, headache, chest pain, dyspnea, cough, abdominal pain, nausea, vomiting or bleeding.  He does have some chronic low back pain. ? ?Vital Signs: ?BP (!) 148/96   Pulse 79   Temp 98.3 ?F (36.8 ?C) (Oral)   Resp 18  SpO2 100%  ? ?Physical Exam awake, alert.  Chest clear to auscultation bilaterally.  Heart with regular rate and rhythm.  Abdomen soft, positive bowel sounds, nontender.  No lower extremity edema. ? ?Imaging: ?No results found. ? ?Labs: ? ?CBC: ?Recent Labs  ?  09/27/21 ?0928 11/14/21 ?0930  ?WBC 4.9 5.6  ?HGB 19.6* 17.7*  ?HCT 54.9* 47.8  ?PLT 126* 141*  ? ? ?COAGS: ?No results for input(s): INR, APTT in the last 8760 hours. ? ?BMP: ?No results for input(s): NA, K, CL, CO2, GLUCOSE, BUN, CALCIUM, CREATININE, GFRNONAA, GFRAA in the last 8760 hours. ? ?Invalid input(s): CMP ? ?LIVER FUNCTION TESTS: ?No results for input(s): BILITOT, AST, ALT, ALKPHOS, PROT, ALBUMIN in the last 8760 hours. ? ?TUMOR MARKERS: ?No results for input(s): AFPTM, CEA, CA199,  CHROMGRNA in the last 8760 hours. ? ?Assessment and Plan: ?61 y.o. male smoker  with past medical history of alcoholism, anxiety/depression, arthritis, diabetes, cholelithiasis, GERD, hypertension, nephrolithiasis, obesity and now with erythrocytosis and elevated ferritin.  He presents today for CT-guided bone marrow biopsy for further evaluation.Risks and benefits of procedure was discussed with the patient  including, but not limited to bleeding, infection, damage to adjacent structures or low yield requiring additional tests. ? ?All of the questions were answered and there is agreement to proceed. ? ?Consent signed and in chart. ? ? ? ? ?Thank you for this interesting consult.  I greatly enjoyed meeting MACLANE HOLLORAN and look forward to participating in their care.  A copy of this report was sent to the requesting provider on this date. ? ?Electronically Signed: ?Autumn Messing, PA-C ?11/14/2021, 9:51 AM ? ? ?I spent a total of  20 minutes  in face to face in clinical consultation, greater than 50% of which was counseling/coordinating care for CT-guided bone marrow biopsy ? ?

## 2021-11-15 ENCOUNTER — Inpatient Hospital Stay (HOSPITAL_COMMUNITY): Payer: PPO

## 2021-11-15 ENCOUNTER — Other Ambulatory Visit (HOSPITAL_COMMUNITY): Payer: PPO

## 2021-11-15 ENCOUNTER — Ambulatory Visit (HOSPITAL_COMMUNITY): Payer: PPO | Admitting: Hematology

## 2021-11-15 DIAGNOSIS — D751 Secondary polycythemia: Secondary | ICD-10-CM | POA: Diagnosis not present

## 2021-11-15 DIAGNOSIS — R7989 Other specified abnormal findings of blood chemistry: Secondary | ICD-10-CM | POA: Insufficient documentation

## 2021-11-15 LAB — CBC WITH DIFFERENTIAL/PLATELET
Abs Immature Granulocytes: 0.01 10*3/uL (ref 0.00–0.07)
Basophils Absolute: 0.1 10*3/uL (ref 0.0–0.1)
Basophils Relative: 1 %
Eosinophils Absolute: 0.2 10*3/uL (ref 0.0–0.5)
Eosinophils Relative: 5 %
HCT: 46.3 % (ref 39.0–52.0)
Hemoglobin: 16.5 g/dL (ref 13.0–17.0)
Immature Granulocytes: 0 %
Lymphocytes Relative: 32 %
Lymphs Abs: 1.6 10*3/uL (ref 0.7–4.0)
MCH: 33 pg (ref 26.0–34.0)
MCHC: 35.6 g/dL (ref 30.0–36.0)
MCV: 92.6 fL (ref 80.0–100.0)
Monocytes Absolute: 0.5 10*3/uL (ref 0.1–1.0)
Monocytes Relative: 10 %
Neutro Abs: 2.6 10*3/uL (ref 1.7–7.7)
Neutrophils Relative %: 52 %
Platelets: 145 10*3/uL — ABNORMAL LOW (ref 150–400)
RBC: 5 MIL/uL (ref 4.22–5.81)
RDW: 12.2 % (ref 11.5–15.5)
WBC: 5.1 10*3/uL (ref 4.0–10.5)
nRBC: 0 % (ref 0.0–0.2)

## 2021-11-15 LAB — SURGICAL PATHOLOGY

## 2021-11-15 NOTE — Progress Notes (Signed)
[name] presents today for theraputic phlebotomy per MD orders. Last hgb/hct on  was .VSS prior to procedure. Pt reports eating before arrival. Procedure started at 1152 using patients left AC. 500 mLs of blood removed. Procedure ended at 1203. Gauze and coban applied to [facility], site clean and dry. VSS upon completion of procedure. Pt denies dizziness, lightheadedness, or feeling faint. Discharged in satisfactory condition with follow up instructions.  ?

## 2021-11-15 NOTE — Patient Instructions (Signed)
[name]  Discharge Instructions: ?Thank you for choosing [facility] to provide your oncology and hematology care.  ?If you have a lab appointment with the [facility], please come in thru the Main Entrance and check in at the main information desk. ? ?Wear comfortable clothing and clothing appropriate for easy access to any Portacath or PICC line.  ? ?We strive to give you quality time with your provider. You may need to reschedule your appointment if you arrive late (15 or more minutes).  Arriving late affects you and other patients whose appointments are after yours.  Also, if you miss three or more appointments without notifying the office, you may be dismissed from the clinic at the provider?s discretion.    ?  ?For prescription refill requests, have your pharmacy contact our office and allow 72 hours for refills to be completed.   ? ?Today you received therapeutic phlebotomy  ? ? ?BELOW ARE SYMPTOMS THAT SHOULD BE REPORTED IMMEDIATELY: ?*FEVER GREATER THAN 100.4 F (38 ?C) OR HIGHER ?*CHILLS OR SWEATING ?*NAUSEA AND VOMITING THAT IS NOT CONTROLLED WITH YOUR NAUSEA MEDICATION ?*UNUSUAL SHORTNESS OF BREATH ?*UNUSUAL BRUISING OR BLEEDING ?*URINARY PROBLEMS (pain or burning when urinating, or frequent urination) ?*BOWEL PROBLEMS (unusual diarrhea, constipation, pain near the anus) ?TENDERNESS IN MOUTH AND THROAT WITH OR WITHOUT PRESENCE OF ULCERS (sore throat, sores in mouth, or a toothache) ?UNUSUAL RASH, SWELLING OR PAIN  ?UNUSUAL VAGINAL DISCHARGE OR ITCHING  ? ?Items with * indicate a potential emergency and should be followed up as soon as possible or go to the Emergency Department if any problems should occur. ? ?Please show the CHEMOTHERAPY ALERT CARD or IMMUNOTHERAPY ALERT CARD at check-in to the Emergency Department and triage nurse. ? ?Should you have questions after your visit or need to cancel or reschedule your appointment, please contact [facility]  [phone]  and follow the prompts.  Office hours are 8:00 a.m. to 4:30 p.m. Monday - Friday. Please note that voicemails left after 4:00 p.m. may not be returned until the following business day.  We are closed weekends and major holidays. You have access to a nurse at all times for urgent questions. Please call the main number to the clinic [phone] and follow the prompts. ? ?For any non-urgent questions, you may also contact your provider using MyChart. We now offer e-Visits for anyone 54 and older to request care online for non-urgent symptoms. For details visit [email or web address]. ?  ?Also download the MyChart app! Go to the app store, search "MyChart", open the app, select [facility], and log in with your MyChart username and password. ? ?Due to Covid, a mask is required upon entering the hospital/clinic. If you do not have a mask, one will be given to you upon arrival. For doctor visits, patients may have 1 support person aged 49 or older with them. For treatment visits, patients cannot have anyone with them due to current Covid guidelines and our immunocompromised population.  ?

## 2021-11-22 ENCOUNTER — Encounter (HOSPITAL_COMMUNITY): Payer: Self-pay | Admitting: Hematology

## 2021-11-28 DIAGNOSIS — R945 Abnormal results of liver function studies: Secondary | ICD-10-CM | POA: Diagnosis not present

## 2021-11-28 DIAGNOSIS — R748 Abnormal levels of other serum enzymes: Secondary | ICD-10-CM | POA: Diagnosis not present

## 2021-11-28 DIAGNOSIS — D751 Secondary polycythemia: Secondary | ICD-10-CM | POA: Diagnosis not present

## 2021-12-02 ENCOUNTER — Inpatient Hospital Stay (HOSPITAL_COMMUNITY): Payer: PPO | Attending: Hematology | Admitting: Hematology

## 2021-12-02 VITALS — BP 153/91 | HR 75 | Temp 98.6°F | Resp 18 | Ht 68.7 in | Wt 238.5 lb

## 2021-12-02 DIAGNOSIS — K8020 Calculus of gallbladder without cholecystitis without obstruction: Secondary | ICD-10-CM | POA: Diagnosis not present

## 2021-12-02 DIAGNOSIS — R918 Other nonspecific abnormal finding of lung field: Secondary | ICD-10-CM | POA: Diagnosis not present

## 2021-12-02 DIAGNOSIS — R7989 Other specified abnormal findings of blood chemistry: Secondary | ICD-10-CM | POA: Diagnosis not present

## 2021-12-02 DIAGNOSIS — I10 Essential (primary) hypertension: Secondary | ICD-10-CM | POA: Insufficient documentation

## 2021-12-02 DIAGNOSIS — F17210 Nicotine dependence, cigarettes, uncomplicated: Secondary | ICD-10-CM | POA: Diagnosis not present

## 2021-12-02 DIAGNOSIS — R911 Solitary pulmonary nodule: Secondary | ICD-10-CM | POA: Diagnosis not present

## 2021-12-02 DIAGNOSIS — E1165 Type 2 diabetes mellitus with hyperglycemia: Secondary | ICD-10-CM | POA: Diagnosis not present

## 2021-12-02 DIAGNOSIS — L438 Other lichen planus: Secondary | ICD-10-CM | POA: Diagnosis not present

## 2021-12-02 DIAGNOSIS — Z808 Family history of malignant neoplasm of other organs or systems: Secondary | ICD-10-CM | POA: Insufficient documentation

## 2021-12-02 DIAGNOSIS — D751 Secondary polycythemia: Secondary | ICD-10-CM | POA: Insufficient documentation

## 2021-12-02 DIAGNOSIS — K219 Gastro-esophageal reflux disease without esophagitis: Secondary | ICD-10-CM | POA: Diagnosis not present

## 2021-12-02 DIAGNOSIS — E119 Type 2 diabetes mellitus without complications: Secondary | ICD-10-CM | POA: Insufficient documentation

## 2021-12-02 DIAGNOSIS — I700 Atherosclerosis of aorta: Secondary | ICD-10-CM | POA: Diagnosis not present

## 2021-12-02 NOTE — Patient Instructions (Addendum)
[facility] ?Discharge Instructions ? ?You were seen and examined today by Dr. Delton Coombes. He reviewed your most recent labs and bone marrow biopsy and everything looks good. We will keep a check on your blood counts with labs every 2 months and schedule phlebotomies as needed. Please keep follow up appointments as scheduled.  ? ? ?Thank you for choosing [facility] to provide your oncology and hematology care.  To afford each patient quality time with our provider, please arrive at least 15 minutes before your scheduled appointment time.  ? ?If you have a lab appointment with the [facility] please come in thru the Main Entrance and check in at the main information desk. ? ?You need to re-schedule your appointment should you arrive 10 or more minutes late.  We strive to give you quality time with our providers, and arriving late affects you and other patients whose appointments are after yours.  Also, if you no show three or more times for appointments you may be dismissed from the clinic at the providers discretion.     ?Again, thank you for choosing [facility].  Our hope is that these requests will decrease the amount of time that you wait before being seen by our physicians.       ?_____________________________________________________________ ? ?Should you have questions after your visit to [facility], please contact our office at [phone] and follow the prompts.  Our office hours are 8:00 a.m. and 4:30 p.m. Monday - Friday.  Please note that voicemails left after 4:00 p.m. may not be returned until the following business day.  We are closed weekends and major holidays.  You do have access to a nurse 24-7, just call the main number to the clinic [phone] and do not press any options, hold on the line and a nurse will answer the phone.   ? ?For prescription refill requests, have your pharmacy contact  our office and allow 72 hours.   ? ?Due to Covid, you will need to wear a mask upon entering the hospital. If you do not have a mask, a mask will be given to you at the Main Entrance upon arrival. For doctor visits, patients may have 1 support person age 52 or older with them. For treatment visits, patients can not have anyone with them due to social distancing guidelines and our immunocompromised population.  ? ?  ?

## 2021-12-02 NOTE — Progress Notes (Signed)
? ?[name] ?[address] ?[place], [place] [address] ? ? ?CLINIC:  ?Medical Oncology/Hematology ? ?PCP:  ?Rosalee Kaufman, PA-C ?[place] / [place] [address]  ?[phone] ? ?REASON FOR VISIT:  ?Follow-up for erythrocytosis and elevated ferritin ? ?PRIOR THERAPY: Phlebotomy x3 (03/29/2021) ? ?CURRENT THERAPY: surveillance ? ?INTERVAL HISTORY:  ?[name], a 61 y.o. male, returns for routine follow-up for his erythrocytosis and elevated ferritin. [name] was last seen on 10/14/2021. ? ?Today he reports feeling good. He did not notice improvement in his energy following his phlebotomies. He reports occasional itching on his right arm, and he denies itching after showers and changing colors of his fingertips. He continues to smoke occasionally. He denies history of sleep apnea.  ? ?REVIEW OF SYSTEMS:  ?Review of Systems  ?Constitutional:  Negative for appetite change and fatigue.  ?Skin:  Negative for itching.  ?All other systems reviewed and are negative. ? ?PAST MEDICAL/SURGICAL HISTORY:  ?Past Medical History:  ?Diagnosis Date  ? Alcoholism ([place])   ? Anxiety   ? Arthritis   ? Depression   ? Diabetes ([place])   ? Gallstones   ? GERD (gastroesophageal reflux disease)   ? Hypertension   ? Kidney stones   ? Obesity   ? ?Past Surgical History:  ?Procedure Laterality Date  ? ANKLE SURGERY    ? left  ? KNEE SURGERY    ? right  ? LEG SURGERY    ? bilateral  ? ? ?SOCIAL HISTORY:  ?Social History  ? ?Socioeconomic History  ? Marital status: Married  ?  Spouse name: Not on file  ? Number of children: Not on file  ? Years of education: Not on file  ? Highest education level: Not on file  ?Occupational History  ? Not on file  ?Tobacco Use  ? Smoking status: Some Days  ?  Packs/day: 2.00  ?  Years: 20.00  ?  Pack years: 40.00  ?  Types: Cigarettes  ?  Last attempt to quit: 08/19/2012  ?  Years since quitting: 9.2  ? Smokeless tobacco: Never  ?Substance and Sexual Activity  ? Alcohol use: Yes  ?   Comment: occasionally; officially "quit" 6 weeks ago  ? Drug use: No  ? Sexual activity: Not on file  ?Other Topics Concern  ? Not on file  ?Social History Narrative  ? Not on file  ? ?Social Determinants of Health  ? ?Financial Resource Strain: Not on file  ?Food Insecurity: Not on file  ?Transportation Needs: Not on file  ?Physical Activity: Not on file  ?Stress: Not on file  ?Social Connections: Not on file  ?Intimate Partner Violence: Not on file  ? ? ?FAMILY HISTORY:  ?Family History  ?Problem Relation Age of Onset  ? Liver cancer Mother   ? Cancer Unknown   ?     mult uncles  ? Cancer Sister   ? [name] cancer Neg Hx   ? Stomach cancer Neg Hx   ? ? ?CURRENT MEDICATIONS:  ?Current Outpatient Medications  ?Medication Sig Dispense Refill  ? ALPRAZolam (XANAX) 1 MG tablet Take 1 mg by mouth 3 (three) times daily.    ? ibuprofen (ADVIL) 800 MG tablet Take 800 mg by mouth 2 (two) times daily.    ? Multiple Vitamins-Minerals (MULTIVITAMIN WITH MINERALS) tablet Take 1 tablet by mouth daily.    ? PRESCRIPTION MEDICATION     ? lisinopril (PRINIVIL,ZESTRIL) 5 MG tablet Take 5 mg  by mouth daily.    ? omeprazole (PRILOSEC) 20 MG capsule Take 20 mg by mouth daily.    ? ?No current facility-administered medications for this visit.  ? ? ?ALLERGIES:  ?Allergies  ?Allergen Reactions  ? Clindamycin/Lincomycin Rash  ? Penicillins Nausea And Vomiting and Other (See Comments)  ?  Beak out in "sweats"  ? ? ?PHYSICAL EXAM:  ?Performance status (ECOG): 1 - Symptomatic but completely ambulatory ? ?Vitals:  ? 12/02/21 1355  ?BP: (!) 153/91  ?Pulse: 75  ?Resp: 18  ?Temp: 98.6 ?F (37 ?C)  ?SpO2: 99%  ? ?Wt Readings from Last 3 Encounters:  ?12/02/21 238 lb 8.6 oz (108.2 kg)  ?10/14/21 233 lb 4 oz (105.8 kg)  ?09/27/21 231 lb 8 oz (105 kg)  ? ?Physical Exam ?Vitals reviewed.  ?Constitutional:   ?   Appearance: Normal appearance. He is obese.  ?Cardiovascular:  ?   Rate and Rhythm: Normal rate and regular rhythm.  ?   Pulses: Normal pulses.   ?   Heart sounds: Normal heart sounds.  ?Pulmonary:  ?   Effort: Pulmonary effort is normal.  ?   Breath sounds: Normal breath sounds.  ?Neurological:  ?   General: No focal deficit present.  ?   Mental Status: He is alert and oriented to person, place, and time.  ?Psychiatric:     ?   Mood and Affect: Mood normal.     ?   Behavior: Behavior normal.  ? ? ?LABORATORY DATA:  ?I have reviewed the labs as listed.  ? ?  Latest Ref Rng & Units 11/15/2021  ?  9:36 AM 11/14/2021  ?  9:30 AM 09/27/2021  ?  9:28 AM  ?CBC  ?WBC 4.0 - 10.5 K/uL 5.1   5.6   4.9    ?Hemoglobin 13.0 - 17.0 g/dL 16.5   17.7   19.6    ?Hematocrit 39.0 - 52.0 % 46.3   47.8   54.9    ?Platelets 150 - 400 K/uL 145   141   126    ? ? ?  Latest Ref Rng & Units 09/12/2010  ?  1:01 PM 09/10/2010  ?  3:30 PM 03/08/2008  ?  4:30 AM  ?CMP  ?Glucose 70 - 99 mg/dL 140   161   88    ?BUN 6 - 23 mg/dL '11   13   13    ' ?Creatinine 0.4 - 1.5 mg/dL 1.21   1.11   0.86    ?Sodium 135 - 145 mEq/L 133   137   135    ?Potassium 3.5 - 5.1 mEq/L 3.4   4.0   4.0    ?Chloride 96 - 112 mEq/L 103   106   104    ?CO2 19 - 32 mEq/L '23   22   24    ' ?Calcium 8.4 - 10.5 mg/dL 8.7   9.1   8.1    ? ?   ?Component Value Date/Time  ? RBC 5.00 11/15/2021 0936  ? MCV 92.6 11/15/2021 0936  ? MCH 33.0 11/15/2021 0936  ? MCHC 35.6 11/15/2021 0936  ? RDW 12.2 11/15/2021 0936  ? LYMPHSABS 1.6 11/15/2021 0936  ? MONOABS 0.5 11/15/2021 0936  ? EOSABS 0.2 11/15/2021 0936  ? BASOSABS 0.1 11/15/2021 0936  ? ? ?DIAGNOSTIC IMAGING:  ?I have independently reviewed the scans and discussed with the patient. ?CT Biopsy ? ?Result Date: 11/14/2021 ?INDICATION: 61 year old with erythrocytosis and elevated ferritin. EXAM: CT GUIDED BONE  MARROW ASPIRATES AND BIOPSY Physician: Stephan Minister. Anselm Pancoast, MD MEDICATIONS: None. ANESTHESIA/SEDATION: Moderate (conscious) sedation was employed during this procedure. A total of Versed 4.57m and fentanyl 100 mcg was administered intravenously at the order of the provider performing the  procedure. Total intra-service moderate sedation time: 14 minutes. Patient's level of consciousness and vital signs were monitored continuously by radiology nurse throughout the procedure under the supervision of the provider performing the procedure. COMPLICATIONS: None immediate. PROCEDURE: The procedure was explained to the patient. The risks and benefits of the procedure were discussed and the patient's questions were addressed. Informed consent was obtained from the patient. The patient was placed prone on CT table. Images of the pelvis were obtained. The right side of back was prepped and draped in sterile fashion. The skin and right posterior ilium were anesthetized with 1% lidocaine. 11 gauge bone needle was directed into the right ilium with CT guidance. Two aspirates and two core biopsies were obtained. Bandage placed over the puncture site. FINDINGS: Again noted is a small osseous defect involving the posterior left iliac wing that is unchanged since 09/12/2010. Bone needle was directed into the posterior right ilium. IMPRESSION: CT guided bone marrow aspiration and core biopsy. Electronically Signed   By: AMarkus DaftM.D.   On: 11/14/2021 12:18  ? ?CT BONE MARROW BIOPSY & ASPIRATION ? ?Result Date: 11/14/2021 ?INDICATION: 61year old with erythrocytosis and elevated ferritin. EXAM: CT GUIDED BONE MARROW ASPIRATES AND BIOPSY Physician: AStephan Minister HAnselm Pancoast MD MEDICATIONS: None. ANESTHESIA/SEDATION: Moderate (conscious) sedation was employed during this procedure. A total of Versed 4.09mand fentanyl 100 mcg was administered intravenously at the order of the provider performing the procedure. Total intra-service moderate sedation time: 14 minutes. Patient's level of consciousness and vital signs were monitored continuously by radiology nurse throughout the procedure under the supervision of the provider performing the procedure. COMPLICATIONS: None immediate. PROCEDURE: The procedure was explained to the patient.  The risks and benefits of the procedure were discussed and the patient's questions were addressed. Informed consent was obtained from the patient. The patient was placed prone on CT table. Images of the pe

## 2022-02-03 ENCOUNTER — Inpatient Hospital Stay (HOSPITAL_COMMUNITY): Payer: PPO | Attending: Hematology

## 2022-02-26 DIAGNOSIS — R945 Abnormal results of liver function studies: Secondary | ICD-10-CM | POA: Diagnosis not present

## 2022-02-26 DIAGNOSIS — R748 Abnormal levels of other serum enzymes: Secondary | ICD-10-CM | POA: Diagnosis not present

## 2022-03-03 DIAGNOSIS — D751 Secondary polycythemia: Secondary | ICD-10-CM | POA: Diagnosis not present

## 2022-03-03 DIAGNOSIS — I700 Atherosclerosis of aorta: Secondary | ICD-10-CM | POA: Diagnosis not present

## 2022-03-03 DIAGNOSIS — L438 Other lichen planus: Secondary | ICD-10-CM | POA: Diagnosis not present

## 2022-03-03 DIAGNOSIS — Z8781 Personal history of (healed) traumatic fracture: Secondary | ICD-10-CM | POA: Diagnosis not present

## 2022-03-03 DIAGNOSIS — R739 Hyperglycemia, unspecified: Secondary | ICD-10-CM | POA: Diagnosis not present

## 2022-03-03 DIAGNOSIS — Z6835 Body mass index (BMI) 35.0-35.9, adult: Secondary | ICD-10-CM | POA: Diagnosis not present

## 2022-03-03 DIAGNOSIS — K219 Gastro-esophageal reflux disease without esophagitis: Secondary | ICD-10-CM | POA: Diagnosis not present

## 2022-03-03 DIAGNOSIS — Z1389 Encounter for screening for other disorder: Secondary | ICD-10-CM | POA: Diagnosis not present

## 2022-03-03 DIAGNOSIS — R911 Solitary pulmonary nodule: Secondary | ICD-10-CM | POA: Diagnosis not present

## 2022-03-03 DIAGNOSIS — K8020 Calculus of gallbladder without cholecystitis without obstruction: Secondary | ICD-10-CM | POA: Diagnosis not present

## 2022-03-03 DIAGNOSIS — Z1331 Encounter for screening for depression: Secondary | ICD-10-CM | POA: Diagnosis not present

## 2022-03-03 DIAGNOSIS — E1165 Type 2 diabetes mellitus with hyperglycemia: Secondary | ICD-10-CM | POA: Diagnosis not present

## 2022-04-07 ENCOUNTER — Inpatient Hospital Stay (HOSPITAL_COMMUNITY): Payer: PPO | Attending: Hematology

## 2022-04-07 DIAGNOSIS — D751 Secondary polycythemia: Secondary | ICD-10-CM | POA: Diagnosis not present

## 2022-04-07 LAB — CBC WITH DIFFERENTIAL/PLATELET
Abs Immature Granulocytes: 0.01 10*3/uL (ref 0.00–0.07)
Basophils Absolute: 0 10*3/uL (ref 0.0–0.1)
Basophils Relative: 1 %
Eosinophils Absolute: 0.2 10*3/uL (ref 0.0–0.5)
Eosinophils Relative: 3 %
HCT: 44.1 % (ref 39.0–52.0)
Hemoglobin: 16.1 g/dL (ref 13.0–17.0)
Immature Granulocytes: 0 %
Lymphocytes Relative: 30 %
Lymphs Abs: 1.5 10*3/uL (ref 0.7–4.0)
MCH: 33.6 pg (ref 26.0–34.0)
MCHC: 36.5 g/dL — ABNORMAL HIGH (ref 30.0–36.0)
MCV: 92.1 fL (ref 80.0–100.0)
Monocytes Absolute: 0.4 10*3/uL (ref 0.1–1.0)
Monocytes Relative: 8 %
Neutro Abs: 2.9 10*3/uL (ref 1.7–7.7)
Neutrophils Relative %: 58 %
Platelets: 137 10*3/uL — ABNORMAL LOW (ref 150–400)
RBC: 4.79 MIL/uL (ref 4.22–5.81)
RDW: 12.4 % (ref 11.5–15.5)
WBC: 4.9 10*3/uL (ref 4.0–10.5)
nRBC: 0 % (ref 0.0–0.2)

## 2022-05-26 DIAGNOSIS — E1165 Type 2 diabetes mellitus with hyperglycemia: Secondary | ICD-10-CM | POA: Diagnosis not present

## 2022-05-26 DIAGNOSIS — R5383 Other fatigue: Secondary | ICD-10-CM | POA: Diagnosis not present

## 2022-05-26 LAB — LAB REPORT - SCANNED: EGFR: 83.6

## 2022-06-02 DIAGNOSIS — L438 Other lichen planus: Secondary | ICD-10-CM | POA: Diagnosis not present

## 2022-06-02 DIAGNOSIS — E1165 Type 2 diabetes mellitus with hyperglycemia: Secondary | ICD-10-CM | POA: Diagnosis not present

## 2022-06-02 DIAGNOSIS — K219 Gastro-esophageal reflux disease without esophagitis: Secondary | ICD-10-CM | POA: Diagnosis not present

## 2022-06-02 DIAGNOSIS — R030 Elevated blood-pressure reading, without diagnosis of hypertension: Secondary | ICD-10-CM | POA: Diagnosis not present

## 2022-06-02 DIAGNOSIS — R739 Hyperglycemia, unspecified: Secondary | ICD-10-CM | POA: Diagnosis not present

## 2022-06-02 DIAGNOSIS — D751 Secondary polycythemia: Secondary | ICD-10-CM | POA: Diagnosis not present

## 2022-06-02 DIAGNOSIS — I700 Atherosclerosis of aorta: Secondary | ICD-10-CM | POA: Diagnosis not present

## 2022-06-02 DIAGNOSIS — F419 Anxiety disorder, unspecified: Secondary | ICD-10-CM | POA: Diagnosis not present

## 2022-06-02 DIAGNOSIS — R911 Solitary pulmonary nodule: Secondary | ICD-10-CM | POA: Diagnosis not present

## 2022-06-02 DIAGNOSIS — Z8781 Personal history of (healed) traumatic fracture: Secondary | ICD-10-CM | POA: Diagnosis not present

## 2022-06-02 DIAGNOSIS — M25521 Pain in right elbow: Secondary | ICD-10-CM | POA: Diagnosis not present

## 2022-06-02 DIAGNOSIS — K8020 Calculus of gallbladder without cholecystitis without obstruction: Secondary | ICD-10-CM | POA: Diagnosis not present

## 2022-06-06 DIAGNOSIS — R911 Solitary pulmonary nodule: Secondary | ICD-10-CM | POA: Diagnosis not present

## 2022-06-06 DIAGNOSIS — K8020 Calculus of gallbladder without cholecystitis without obstruction: Secondary | ICD-10-CM | POA: Diagnosis not present

## 2022-06-06 DIAGNOSIS — I700 Atherosclerosis of aorta: Secondary | ICD-10-CM | POA: Diagnosis not present

## 2022-06-06 DIAGNOSIS — N200 Calculus of kidney: Secondary | ICD-10-CM | POA: Diagnosis not present

## 2022-06-06 DIAGNOSIS — R918 Other nonspecific abnormal finding of lung field: Secondary | ICD-10-CM | POA: Diagnosis not present

## 2022-06-09 ENCOUNTER — Inpatient Hospital Stay (HOSPITAL_COMMUNITY): Payer: PPO | Attending: Hematology

## 2022-06-09 DIAGNOSIS — R7989 Other specified abnormal findings of blood chemistry: Secondary | ICD-10-CM | POA: Diagnosis not present

## 2022-06-09 DIAGNOSIS — D751 Secondary polycythemia: Secondary | ICD-10-CM | POA: Insufficient documentation

## 2022-06-09 LAB — CBC WITH DIFFERENTIAL/PLATELET
Abs Immature Granulocytes: 0.02 10*3/uL (ref 0.00–0.07)
Basophils Absolute: 0 10*3/uL (ref 0.0–0.1)
Basophils Relative: 1 %
Eosinophils Absolute: 0 10*3/uL (ref 0.0–0.5)
Eosinophils Relative: 0 %
HCT: 47.4 % (ref 39.0–52.0)
Hemoglobin: 16.9 g/dL (ref 13.0–17.0)
Immature Granulocytes: 1 %
Lymphocytes Relative: 22 %
Lymphs Abs: 1 10*3/uL (ref 0.7–4.0)
MCH: 33.4 pg (ref 26.0–34.0)
MCHC: 35.7 g/dL (ref 30.0–36.0)
MCV: 93.7 fL (ref 80.0–100.0)
Monocytes Absolute: 0.2 10*3/uL (ref 0.1–1.0)
Monocytes Relative: 5 %
Neutro Abs: 3.1 10*3/uL (ref 1.7–7.7)
Neutrophils Relative %: 71 %
Platelets: 133 10*3/uL — ABNORMAL LOW (ref 150–400)
RBC: 5.06 MIL/uL (ref 4.22–5.81)
RDW: 12.1 % (ref 11.5–15.5)
WBC: 4.4 10*3/uL (ref 4.0–10.5)
nRBC: 0 % (ref 0.0–0.2)

## 2022-06-09 LAB — FERRITIN: Ferritin: 312 ng/mL (ref 24–336)

## 2022-06-09 LAB — IRON AND TIBC
Iron: 140 ug/dL (ref 45–182)
Saturation Ratios: 43 % — ABNORMAL HIGH (ref 17.9–39.5)
TIBC: 325 ug/dL (ref 250–450)
UIBC: 185 ug/dL

## 2022-06-09 LAB — LACTATE DEHYDROGENASE: LDH: 151 U/L (ref 98–192)

## 2022-06-16 ENCOUNTER — Inpatient Hospital Stay (HOSPITAL_COMMUNITY): Payer: PPO | Admitting: Hematology

## 2022-06-18 ENCOUNTER — Inpatient Hospital Stay: Payer: PPO | Attending: Hematology | Admitting: Hematology

## 2022-06-18 ENCOUNTER — Encounter: Payer: Self-pay | Admitting: Hematology

## 2022-06-18 VITALS — BP 146/88 | HR 70 | Temp 97.9°F | Resp 18 | Wt 240.3 lb

## 2022-06-18 DIAGNOSIS — R7989 Other specified abnormal findings of blood chemistry: Secondary | ICD-10-CM | POA: Diagnosis not present

## 2022-06-18 DIAGNOSIS — R911 Solitary pulmonary nodule: Secondary | ICD-10-CM | POA: Insufficient documentation

## 2022-06-18 DIAGNOSIS — F17210 Nicotine dependence, cigarettes, uncomplicated: Secondary | ICD-10-CM | POA: Insufficient documentation

## 2022-06-18 DIAGNOSIS — E119 Type 2 diabetes mellitus without complications: Secondary | ICD-10-CM | POA: Insufficient documentation

## 2022-06-18 DIAGNOSIS — Z800 Family history of malignant neoplasm of digestive organs: Secondary | ICD-10-CM | POA: Diagnosis not present

## 2022-06-18 DIAGNOSIS — D751 Secondary polycythemia: Secondary | ICD-10-CM | POA: Diagnosis not present

## 2022-06-18 DIAGNOSIS — I10 Essential (primary) hypertension: Secondary | ICD-10-CM | POA: Diagnosis not present

## 2022-06-18 DIAGNOSIS — D696 Thrombocytopenia, unspecified: Secondary | ICD-10-CM | POA: Diagnosis not present

## 2022-06-18 NOTE — Progress Notes (Signed)
[place], [place] [id number]   CLINIC:  Medical Oncology/Hematology  PCP:  [facility] [place] / [place] [address]  [phone]  REASON FOR VISIT:  Follow-up for erythrocytosis and elevated ferritin  PRIOR THERAPY: Phlebotomy x3 (03/29/2021)  CURRENT THERAPY: surveillance  INTERVAL HISTORY:  [name], a 61 y.o. male, seen for follow-up of erythrocytosis, elevated ferritin and liver disease.  Denies any fevers, infections in the last 6 months.  Energy levels are at 100%.  REVIEW OF SYSTEMS:  Review of Systems  Constitutional:  Negative for appetite change and fatigue.  Skin:  Negative for itching.  All other systems reviewed and are negative.   PAST MEDICAL/SURGICAL HISTORY:  Past Medical History:  Diagnosis Date   Alcoholism ([place])    Anxiety    Arthritis    Depression    Diabetes ([place])    Gallstones    GERD (gastroesophageal reflux disease)    Hypertension    Kidney stones    Obesity    Past Surgical History:  Procedure Laterality Date   ANKLE SURGERY     left   KNEE SURGERY     right   LEG SURGERY     bilateral    SOCIAL HISTORY:  Social History   Socioeconomic History   Marital status: Married    Spouse name: Not on file   Number of children: Not on file   Years of education: Not on file   Highest education level: Not on file  Occupational History   Not on file  Tobacco Use   Smoking status: Some Days    Packs/day: 2.00    Years: 20.00    Total pack years: 40.00    Types: Cigarettes    Last attempt to quit: 08/19/2012    Years since quitting: 9.8   Smokeless tobacco: Never  Substance and Sexual Activity   Alcohol use: Yes    Comment: occasionally; officially "quit" 6 weeks ago   Drug use: No   Sexual activity: Not on file  Other Topics Concern   Not on file  Social History Narrative   Not on file   Social Determinants of Health   Financial Resource Strain: Not  on file  Food Insecurity: Not on file  Transportation Needs: Not on file  Physical Activity: Not on file  Stress: Not on file  Social Connections: Not on file  Intimate Partner Violence: Not on file    FAMILY HISTORY:  Family History  Problem Relation Age of Onset   Liver cancer Mother    Cancer Unknown        mult uncles   Cancer Sister    Colon cancer Neg Hx    Stomach cancer Neg Hx     CURRENT MEDICATIONS:  Current Outpatient Medications  Medication Sig Dispense Refill   ALPRAZolam (XANAX) 1 MG tablet Take 1 mg by mouth 3 (three) times daily.     ibuprofen (ADVIL) 800 MG tablet Take 800 mg by mouth 2 (two) times daily.     Multiple Vitamins-Minerals (MULTIVITAMIN WITH MINERALS) tablet Take 1 tablet by mouth daily.     PRESCRIPTION MEDICATION      lisinopril (PRINIVIL,ZESTRIL) 5 MG tablet Take 5 mg by mouth daily.     omeprazole (PRILOSEC) 20 MG capsule Take 20 mg by mouth daily.     No current facility-administered medications for this visit.    ALLERGIES:  Allergies  Allergen  Reactions   Clindamycin/Lincomycin Rash   Penicillins Nausea And Vomiting and Other (See Comments)    Beak out in "sweats"    PHYSICAL EXAM:  Performance status (ECOG): 1 - Symptomatic but completely ambulatory  Vitals:   06/18/22 1400  BP: (!) 146/88  Pulse: 70  Resp: 18  Temp: 97.9 F (36.6 C)  SpO2: 98%   Wt Readings from Last 3 Encounters:  06/18/22 240 lb 4.8 oz (109 kg)  12/02/21 238 lb 8.6 oz (108.2 kg)  10/14/21 233 lb 4 oz (105.8 kg)   Physical Exam Vitals reviewed.  Constitutional:      Appearance: Normal appearance. He is obese.  Cardiovascular:     Rate and Rhythm: Normal rate and regular rhythm.     Pulses: Normal pulses.     Heart sounds: Normal heart sounds.  Pulmonary:     Effort: Pulmonary effort is normal.     Breath sounds: Normal breath sounds.  Neurological:     General: No focal deficit present.     Mental Status: He is alert and oriented to person,  place, and time.  Psychiatric:        Mood and Affect: Mood normal.        Behavior: Behavior normal.     LABORATORY DATA:  I have reviewed the labs as listed.     Latest Ref Rng & Units 06/09/2022   12:14 PM 04/07/2022   10:44 AM 11/15/2021    9:36 AM  CBC  WBC 4.0 - 10.5 K/uL 4.4  4.9  5.1   Hemoglobin 13.0 - 17.0 g/dL 16.9  16.1  16.5   Hematocrit 39.0 - 52.0 % 47.4  44.1  46.3   Platelets 150 - 400 K/uL 133  137  145       Latest Ref Rng & Units 09/12/2010    1:01 PM 09/10/2010    3:30 PM 03/08/2008    4:30 AM  CMP  Glucose 70 - 99 mg/dL 140  161  88   BUN 6 - 23 mg/dL _0 Creatinine 0.4 - 1.5 mg/dL 1.21  1.11  0.86   Sodium 135 - 145 mEq/L 133  137  135   Potassium 3.5 - 5.1 mEq/L 3.4  4.0  4.0   Chloride 96 - 112 mEq/L 103  106  104   CO2 19 - 32 mEq/L _1 Calcium 8.4 - 10.5 mg/dL 8.7  9.1  8.1       Component Value Date/Time   RBC 5.06 06/09/2022 1214   MCV 93.7 06/09/2022 1214   MCH 33.4 06/09/2022 1214   MCHC 35.7 06/09/2022 1214   RDW 12.1 06/09/2022 1214   LYMPHSABS 1.0 06/09/2022 1214   MONOABS 0.2 06/09/2022 1214   EOSABS 0.0 06/09/2022 1214   BASOSABS 0.0 06/09/2022 1214    DIAGNOSTIC IMAGING:  I have independently reviewed the scans and discussed with the patient. No results found.   ASSESSMENT:  Erythrocytosis: - Patient evaluated for erythrocytosis at [facility] in July 2022. - CBC on 03/18/2021 showed hemoglobin 18.7, hematocrit 51. - He underwent phlebotomy on 03/29/2021.  He reported improvement in energy levels which lasted 2 weeks. - CBC on 04/10/2021 showed hemoglobin 17.4 and hematocrit 47. - Testing for JAK2 exon 12/14, CALR exon 9 and MPL exon 10 was negative. - Denies any testosterone supplements. - Denies any fevers or night sweats.  Reports 70 to 80 pound weight loss in the  last 2 years after changing diet and starting exercise. - Denies any aquagenic pruritus or vasomotor symptoms.  No headaches or vision changes. - No  clinical signs or symptoms of OSA. - CBC on 08/27/2021 at [facility] office shows hemoglobin 19.3, hematocrit 55.1.  Platelet count was 131. - Serum EPO-7.3 - BMBX (11/14/2021): Slightly hypercellular marrow for age with trilineage hematopoiesis.  Megakaryocytes are abundant and normal morphology.  No significant dyspoiesis or increase in blast cells.  Features are not considered specific for diagnosis of myeloid neoplasm.  Chromosome analysis was normal.    Elevated ferritin - Labs on 03/18/2021 with ferritin 648.  Saturation 30.  Hepatitis C antibody negative. - Hereditary hemochromatosis assay for C282Y and H63D negative. - CT chest and abdomen on 04/10/2021 with subtle nodular configuration of liver contour suspicious of cirrhosis.  Spleen reported as normal size. - Ultrasound abdomen on 03/08/2021 showed increased echogenicity in the liver thought to be secondary to hepatic steatosis.    Social/family history: - He is currently disabled.  Worked at [employer].  Lives at home with his wife. - He smokes sporadically.  He quit smoking regularly about 2 to 3 years ago.  Smoked 2 packs/day for 35 years. - He drinks 7-8 beers per night. - No family history of polycythemia or hemochromatosis.  Mother died of liver cancer.  2 maternal uncles died of liver cancer.  1 maternal aunt died of cancer.   PLAN:  JAK2 [id number] negative erythrocytosis:: - He did not have any phlebotomies in the last 6 months. - Previous bone marrow biopsy was not suggestive of myeloproliferative neoplasm. - He had some improvement in energy levels after previous phlebotomies. - No clinical signs of sleep apnea.  He is not smoking.  Likely erythrocytosis from COPD. - Labs on 06/09/2022 with hematocrit 47.  Platelet count is 133.  White count is normal.  Does not require any phlebotomy at this time.  Will consider phlebotomy if hematocrit more than 50.  Mild thrombocytopenia likely from liver disease.  RTC 6 months for follow-up  with repeat CBC, ferritin and iron panel.    Elevated ferritin: - Previous testing for hemochromatosis was negative.  Thought to be secondary to liver disease.  Most recent ferritin is 312 and downtrending.  Percent saturation is 43.  3.  Lung nodules: - CT chest on 10/11/2021 showed stable right lung nodules.  His PMD ordered a CT chest without contrast on 06/06/2022 at [facility] which showed a stable 3 mm right lung nodule.  No follow-up necessary based on radiologist assessment.  Orders placed this encounter:  Orders Placed This Encounter  Procedures   CBC with Differential/Platelet   Ferritin   Iron and TIBC      Derek Jack, MD [place] [phone]

## 2022-06-18 NOTE — Patient Instructions (Signed)
[facility]  Discharge Instructions  You were seen and examined today by Dr. Delton Coombes.  Dr. Delton Coombes discussed your most recent lab work which revealed that your iron levels have came down and your platelets are slightly low everything else looks good.   Follow-up as scheduled in 6 months.    Thank you for choosing [facility] to provide your oncology and hematology care.   To afford each patient quality time with our provider, please arrive at least 15 minutes before your scheduled appointment time. You may need to reschedule your appointment if you arrive late (10 or more minutes). Arriving late affects you and other patients whose appointments are after yours.  Also, if you miss three or more appointments without notifying the office, you may be dismissed from the clinic at the provider's discretion.    Again, thank you for choosing [facility].  Our hope is that these requests will decrease the amount of time that you wait before being seen by our physicians.   If you have a lab appointment with the [facility] please come in thru the Main Entrance and check in at the main information desk.           _____________________________________________________________  Should you have questions after your visit to [facility], please contact our office at [phone] and follow the prompts.  Our office hours are 8:00 a.m. to 4:30 p.m. Monday - Thursday and 8:00 a.m. to 2:30 p.m. Friday.  Please note that voicemails left after 4:00 p.m. may not be returned until the following business day.  We are closed weekends and all major holidays.  You do have access to a nurse 24-7, just call the main number to the clinic [phone] and do not press any options, hold on the line and a nurse will answer the phone.    For prescription refill requests, have your pharmacy contact our office and allow 72 hours.     Masks are optional in the cancer centers. If you would like for your care team to wear a mask while they are taking care of you, please let them know. You may have one support person who is at least 59 years old accompany you for your appointments.

## 2022-08-27 DIAGNOSIS — R5383 Other fatigue: Secondary | ICD-10-CM | POA: Diagnosis not present

## 2022-08-27 DIAGNOSIS — R739 Hyperglycemia, unspecified: Secondary | ICD-10-CM | POA: Diagnosis not present

## 2022-09-02 DIAGNOSIS — I700 Atherosclerosis of aorta: Secondary | ICD-10-CM | POA: Diagnosis not present

## 2022-09-02 DIAGNOSIS — R911 Solitary pulmonary nodule: Secondary | ICD-10-CM | POA: Diagnosis not present

## 2022-09-02 DIAGNOSIS — Z8781 Personal history of (healed) traumatic fracture: Secondary | ICD-10-CM | POA: Diagnosis not present

## 2022-09-02 DIAGNOSIS — F419 Anxiety disorder, unspecified: Secondary | ICD-10-CM | POA: Diagnosis not present

## 2022-09-02 DIAGNOSIS — E1165 Type 2 diabetes mellitus with hyperglycemia: Secondary | ICD-10-CM | POA: Diagnosis not present

## 2022-09-02 DIAGNOSIS — F17210 Nicotine dependence, cigarettes, uncomplicated: Secondary | ICD-10-CM | POA: Diagnosis not present

## 2022-09-02 DIAGNOSIS — D751 Secondary polycythemia: Secondary | ICD-10-CM | POA: Diagnosis not present

## 2022-09-02 DIAGNOSIS — M545 Low back pain, unspecified: Secondary | ICD-10-CM | POA: Diagnosis not present

## 2022-09-02 DIAGNOSIS — R030 Elevated blood-pressure reading, without diagnosis of hypertension: Secondary | ICD-10-CM | POA: Diagnosis not present

## 2022-09-02 DIAGNOSIS — K8020 Calculus of gallbladder without cholecystitis without obstruction: Secondary | ICD-10-CM | POA: Diagnosis not present

## 2022-09-02 DIAGNOSIS — K219 Gastro-esophageal reflux disease without esophagitis: Secondary | ICD-10-CM | POA: Diagnosis not present

## 2022-09-02 DIAGNOSIS — L438 Other lichen planus: Secondary | ICD-10-CM | POA: Diagnosis not present

## 2022-11-12 ENCOUNTER — Encounter

## 2022-11-14 ENCOUNTER — Ambulatory Visit: Payer: PRIVATE HEALTH INSURANCE

## 2022-11-25 DIAGNOSIS — R739 Hyperglycemia, unspecified: Secondary | ICD-10-CM | POA: Diagnosis not present

## 2022-11-25 DIAGNOSIS — I10 Essential (primary) hypertension: Secondary | ICD-10-CM | POA: Diagnosis not present

## 2022-11-25 DIAGNOSIS — K219 Gastro-esophageal reflux disease without esophagitis: Secondary | ICD-10-CM | POA: Diagnosis not present

## 2022-11-25 DIAGNOSIS — R945 Abnormal results of liver function studies: Secondary | ICD-10-CM | POA: Diagnosis not present

## 2022-11-25 DIAGNOSIS — L438 Other lichen planus: Secondary | ICD-10-CM | POA: Diagnosis not present

## 2022-11-25 LAB — LAB REPORT - SCANNED: EGFR: 77.1

## 2022-12-02 DIAGNOSIS — K8020 Calculus of gallbladder without cholecystitis without obstruction: Secondary | ICD-10-CM | POA: Diagnosis not present

## 2022-12-02 DIAGNOSIS — K219 Gastro-esophageal reflux disease without esophagitis: Secondary | ICD-10-CM | POA: Diagnosis not present

## 2022-12-02 DIAGNOSIS — R911 Solitary pulmonary nodule: Secondary | ICD-10-CM | POA: Diagnosis not present

## 2022-12-02 DIAGNOSIS — M545 Low back pain, unspecified: Secondary | ICD-10-CM | POA: Diagnosis not present

## 2022-12-02 DIAGNOSIS — L438 Other lichen planus: Secondary | ICD-10-CM | POA: Diagnosis not present

## 2022-12-02 DIAGNOSIS — F419 Anxiety disorder, unspecified: Secondary | ICD-10-CM | POA: Diagnosis not present

## 2022-12-02 DIAGNOSIS — D751 Secondary polycythemia: Secondary | ICD-10-CM | POA: Diagnosis not present

## 2022-12-02 DIAGNOSIS — F17210 Nicotine dependence, cigarettes, uncomplicated: Secondary | ICD-10-CM | POA: Diagnosis not present

## 2022-12-02 DIAGNOSIS — I700 Atherosclerosis of aorta: Secondary | ICD-10-CM | POA: Diagnosis not present

## 2022-12-02 DIAGNOSIS — E1165 Type 2 diabetes mellitus with hyperglycemia: Secondary | ICD-10-CM | POA: Diagnosis not present

## 2022-12-02 DIAGNOSIS — I10 Essential (primary) hypertension: Secondary | ICD-10-CM | POA: Diagnosis not present

## 2022-12-02 DIAGNOSIS — Z8781 Personal history of (healed) traumatic fracture: Secondary | ICD-10-CM | POA: Diagnosis not present

## 2022-12-11 ENCOUNTER — Inpatient Hospital Stay: Payer: PPO | Attending: Hematology

## 2022-12-11 DIAGNOSIS — D751 Secondary polycythemia: Secondary | ICD-10-CM | POA: Diagnosis not present

## 2022-12-11 DIAGNOSIS — Z86711 Personal history of pulmonary embolism: Secondary | ICD-10-CM | POA: Insufficient documentation

## 2022-12-11 DIAGNOSIS — Z800 Family history of malignant neoplasm of digestive organs: Secondary | ICD-10-CM | POA: Insufficient documentation

## 2022-12-11 DIAGNOSIS — R7989 Other specified abnormal findings of blood chemistry: Secondary | ICD-10-CM | POA: Diagnosis not present

## 2022-12-11 DIAGNOSIS — Z87891 Personal history of nicotine dependence: Secondary | ICD-10-CM | POA: Diagnosis not present

## 2022-12-11 DIAGNOSIS — R918 Other nonspecific abnormal finding of lung field: Secondary | ICD-10-CM | POA: Diagnosis not present

## 2022-12-11 LAB — CBC WITH DIFFERENTIAL/PLATELET
Abs Immature Granulocytes: 0.01 10*3/uL (ref 0.00–0.07)
Basophils Absolute: 0.1 10*3/uL (ref 0.0–0.1)
Basophils Relative: 1 %
Eosinophils Absolute: 0.1 10*3/uL (ref 0.0–0.5)
Eosinophils Relative: 1 %
HCT: 53.9 % — ABNORMAL HIGH (ref 39.0–52.0)
Hemoglobin: 19.2 g/dL — ABNORMAL HIGH (ref 13.0–17.0)
Immature Granulocytes: 0 %
Lymphocytes Relative: 27 %
Lymphs Abs: 1.8 10*3/uL (ref 0.7–4.0)
MCH: 33.1 pg (ref 26.0–34.0)
MCHC: 35.6 g/dL (ref 30.0–36.0)
MCV: 92.9 fL (ref 80.0–100.0)
Monocytes Absolute: 0.6 10*3/uL (ref 0.1–1.0)
Monocytes Relative: 9 %
Neutro Abs: 4.3 10*3/uL (ref 1.7–7.7)
Neutrophils Relative %: 62 %
Platelets: 145 10*3/uL — ABNORMAL LOW (ref 150–400)
RBC: 5.8 MIL/uL (ref 4.22–5.81)
RDW: 12 % (ref 11.5–15.5)
WBC: 7 10*3/uL (ref 4.0–10.5)
nRBC: 0 % (ref 0.0–0.2)

## 2022-12-11 LAB — FERRITIN: Ferritin: 128 ng/mL (ref 24–336)

## 2022-12-11 LAB — IRON AND TIBC
Iron: 195 ug/dL — ABNORMAL HIGH (ref 45–182)
Saturation Ratios: 57 % — ABNORMAL HIGH (ref 17.9–39.5)
TIBC: 342 ug/dL (ref 250–450)
UIBC: 147 ug/dL

## 2022-12-14 NOTE — Progress Notes (Unsigned)
Referring Provider:Dr. Leandrew Koyanagi Primary Care Physician:  Royann Shivers, PA-C Primary Gastroenterologist:  Dr. Arlyce Dice previously, establishing with Dr. Jena Gauss.  No chief complaint on file.   HPI:   [name] is a 62 y.o. male presenting today at the request of Dr. Leandrew Koyanagi for elevated LFTs.   LFT elevation dates back at least to 2009.  Also with chronic intermittent thrombocytopenia which appears to be persistent since August 2022. Most recent LFTs 08/28/2022 with AST 29, alk phos 61, ALT 36 (H).  Creatinine 1 point, sodium 140, potassium 4.9.  Platelets 119. On 12/11/2022, platelets 145. No recent INR on file.   In 2014, hepatitis B surface antibody and surface antigen negative, hep C antibody negative. In July 2022, hep B core antibody total nonreactive, ANA negative, AFP within normal limits, ferritin elevated at 648.7.  Heterozygous for H63D, negative for C282Y.  CT chest without contrast 10/09/2021 with coarsened/nodular contour of the liver with hypertrophy of the lateral segment of the left hepatic lobe. Ultrasound in 2014 with fatty liver.  Follows with Dr. Ellin Saba for erythrocytosis felt to be secondary to COPD, and elevated ferritin. He has received phlebotomy in the past.  Today:     Mother died of liver cancer.  2 maternal uncles died of liver cancer.    Colonoscopy in January 2015 with Dr. Arlyce Dice one 7 mm polyp and one 4 mm polyp removed, moderate diverticulosis in the sigmoid colon, internal hemorrhoids.  Pathology with sessile serrated polyp and polyp.  Recommended 5-year surveillance.  Past Medical History:  Diagnosis Date   Alcoholism (HCC)    Anxiety    Arthritis    Depression    Diabetes (HCC)    Gallstones    GERD (gastroesophageal reflux disease)    Hypertension    Kidney stones    Obesity     Past Surgical History:  Procedure Laterality Date   ANKLE SURGERY     left   KNEE SURGERY     right   LEG SURGERY     bilateral     Current Outpatient Medications  Medication Sig Dispense Refill   ALPRAZolam (XANAX) 1 MG tablet Take 1 mg by mouth 3 (three) times daily.     ibuprofen (ADVIL) 800 MG tablet Take 800 mg by mouth 2 (two) times daily.     lisinopril (PRINIVIL,ZESTRIL) 5 MG tablet Take 5 mg by mouth daily.     Multiple Vitamins-Minerals (MULTIVITAMIN WITH MINERALS) tablet Take 1 tablet by mouth daily.     omeprazole (PRILOSEC) 20 MG capsule Take 20 mg by mouth daily.     PRESCRIPTION MEDICATION      No current facility-administered medications for this visit.    Allergies as of 12/17/2022 - Review Complete 06/18/2022  Allergen Reaction Noted   Clindamycin/lincomycin Rash 02/21/2013   Penicillins Nausea And Vomiting and Other (See Comments) 02/21/2013    Family History  Problem Relation Age of Onset   Liver cancer Mother    Cancer Unknown        mult uncles   Cancer Sister    Colon cancer Neg Hx    Stomach cancer Neg Hx     Social History   Socioeconomic History   Marital status: Married    Spouse name: Not on file   Number of children: Not on file   Years of education: Not on file   Highest education level: Not on file  Occupational History   Not on file  Tobacco Use  Smoking status: Some Days    Packs/day: 2.00    Years: 20.00    Additional pack years: 0.00    Total pack years: 40.00    Types: Cigarettes    Last attempt to quit: 08/19/2012    Years since quitting: 10.3   Smokeless tobacco: Never  Substance and Sexual Activity   Alcohol use: Yes    Comment: occasionally; officially "quit" 6 weeks ago   Drug use: No   Sexual activity: Not on file  Other Topics Concern   Not on file  Social History Narrative   Not on file   Social Determinants of Health   Financial Resource Strain: Not on file  Food Insecurity: Not on file  Transportation Needs: Not on file  Physical Activity: Not on file  Stress: Not on file  Social Connections: Not on file  Intimate Partner  Violence: Not on file    Review of Systems: Gen: Denies any fever, chills, fatigue, weight loss, lack of appetite.  CV: Denies chest pain, heart palpitations, peripheral edema, syncope.  Resp: Denies shortness of breath at rest or with exertion. Denies wheezing or cough.  GI: Denies dysphagia or odynophagia. Denies jaundice, hematemesis, fecal incontinence. GU : Denies urinary burning, urinary frequency, urinary hesitancy MS: Denies joint pain, muscle weakness, cramps, or limitation of movement.  Derm: Denies rash, itching, dry skin Psych: Denies depression, anxiety, memory loss, and confusion Heme: Denies bruising, bleeding, and enlarged lymph nodes.  Physical Exam: There were no vitals taken for this visit. General:   Alert and oriented. Pleasant and cooperative. Well-nourished and well-developed.  Head:  Normocephalic and atraumatic. Eyes:  Without icterus, sclera clear and conjunctiva pink.  Ears:  Normal auditory acuity. Lungs:  Clear to auscultation bilaterally. No wheezes, rales, or rhonchi. No distress.  Heart:  S1, S2 present without murmurs appreciated.  Abdomen:  +BS, soft, non-tender and non-distended. No HSM noted. No guarding or rebound. No masses appreciated.  Rectal:  Deferred  Msk:  Symmetrical without gross deformities. Normal posture. Extremities:  Without edema. Neurologic:  Alert and  oriented x4;  grossly normal neurologically. Skin:  Intact without significant lesions or rashes. Psych:  Alert and cooperative. Normal mood and affect.    Assessment:     Plan:  ***   Ermalinda Memos, PA-C [facility] 12/17/2022

## 2022-12-17 ENCOUNTER — Encounter: Payer: Self-pay | Admitting: Gastroenterology

## 2022-12-17 ENCOUNTER — Ambulatory Visit (INDEPENDENT_AMBULATORY_CARE_PROVIDER_SITE_OTHER): Payer: PPO | Admitting: Gastroenterology

## 2022-12-17 ENCOUNTER — Other Ambulatory Visit (HOSPITAL_COMMUNITY)
Admission: RE | Admit: 2022-12-17 | Discharge: 2022-12-17 | Disposition: A | Discharge status: Home / Self Care | Payer: PPO | Source: Ambulatory Visit | Attending: Gastroenterology | Admitting: Gastroenterology

## 2022-12-17 ENCOUNTER — Telehealth: Payer: Self-pay | Admitting: *Deleted

## 2022-12-17 VITALS — BP 137/86 | HR 73 | Temp 98.2°F | Ht 69.0 in | Wt 244.2 lb

## 2022-12-17 DIAGNOSIS — R7989 Other specified abnormal findings of blood chemistry: Secondary | ICD-10-CM

## 2022-12-17 DIAGNOSIS — Z860100 Personal history of colon polyps, unspecified: Secondary | ICD-10-CM | POA: Insufficient documentation

## 2022-12-17 DIAGNOSIS — K7460 Unspecified cirrhosis of liver: Secondary | ICD-10-CM | POA: Diagnosis not present

## 2022-12-17 LAB — HEPATITIS B SURFACE ANTIBODY,QUALITATIVE: Hep B S Ab: NONREACTIVE

## 2022-12-17 LAB — HEPATITIS B SURFACE ANTIGEN: Hepatitis B Surface Ag: NONREACTIVE

## 2022-12-17 LAB — PROTIME-INR
INR: 1.1 (ref 0.8–1.2)
Prothrombin Time: 14.5 seconds (ref 11.4–15.2)

## 2022-12-17 LAB — HEPATITIS A ANTIBODY, TOTAL: hep A Total Ab: NONREACTIVE

## 2022-12-17 LAB — HEPATITIS C ANTIBODY: HCV Ab: NONREACTIVE

## 2022-12-17 LAB — HEPATITIS B CORE ANTIBODY, TOTAL: Hep B Core Total Ab: NONREACTIVE

## 2022-12-17 NOTE — Progress Notes (Unsigned)
[facility] [address], [place] [address]   CLINIC:  Medical Oncology/Hematology  PCP:  Sheela Stack [address] [place] [address] [phone]   REASON FOR VISIT:  Follow-up for erythrocytosis and elevated ferritin  PRIOR THERAPY: Phlebotomy as needed  CURRENT THERAPY: Surveillance  INTERVAL HISTORY:   [name] 62 y.o. male returns for routine follow-up of erythrocytosis, elevated ferritin, and liver disease.  He was last seen by Dr. Ellin Saba on 06/18/2022.  At today's visit, he reports feeling ***.  No recent hospitalizations, surgeries, or changes in baseline health status.  *** Last phlebotomy??  ERYTHROCYTOSIS: *** Smoking (former) *** Sleep apnea (no) *** Diuretics *** Testosterone supplements?  (No) *** Cardiopulmonary disease: COPD *** Carbon monoxide exposure *** Family history MPN?  *** Blood clots. *** Fatigue and sluggishness *** Aquagenic pruritus, Raynaud's, erythromelalgia (itching/burning pain) *** Vasomotor symptoms (dizziness, tinnitus, blurry vision, strokelike symptoms, neuropathy) *** B symptoms *** Abdominal pain (LUQ), nausea, early satiety  He has ***% energy and ***% appetite. He endorses that he is maintaining a stable weight.   ASSESSMENT & PLAN:  1.  Erythrocytosis: - Patient evaluated for erythrocytosis at [facility] in July 2022. - Testing for JAK2 exon 12/14, CALR exon 9 and MPL exon 10 was negative.  Serum erythropoietin normal at 7.3. - BMBX (11/14/2021): Slightly hypercellular marrow for age with trilineage hematopoiesis.  Megakaryocytes are abundant and normal morphology.  No significant dyspoiesis or increase in blast cells.  Features are not considered specific for diagnosis of myeloid neoplasm.  Chromosome analysis was normal.  - Denies any testosterone supplements.*** - Denies any fevers or night sweats.  Reports 70 to 80 pound weight loss in the last 2 years after changing diet and  starting exercise.*** - Denies any aquagenic pruritus or vasomotor symptoms.  No headaches or vision changes.*** - No clinical signs or symptoms of OSA.*** - He is a former smoker, denies any current tobacco use.  *** - He has required intermittent phlebotomy, and reported improvement in energy levels which lasted 2 weeks.  *** Last phlebotomy *** - Symptoms *** - Most recent labs (12/11/2022): Hgb 19.2 with hematocrit 53.9%.  (Mild thrombocytopenia with platelets 145 likely due to liver disease) - DIFFERENTIAL DIAGNOSIS favors likely erythrocytosis from COPD, since there are no clinical signs of sleep apnea, he is not smoking, and MPN workup was negative. - PLAN: Proceed with phlebotomy today.  Repeat CBC + possible phlebotomy in 3 months. - Per Dr. Ellin Saba, will consider phlebotomy if hematocrit >50%. - Recommend aspirin 81 mg daily in the setting of secondary erythrocytosis and additional cardiac risk factors (hypertension ***) - Labs and RTC in 6 months.  ***  2.  Elevated ferritin - Labs on 03/18/2021 with ferritin 648.  Saturation 30.  Hepatitis C antibody negative. - Hereditary hemochromatosis testing was negative. - CT chest and abdomen on 04/10/2021 with subtle nodular configuration of liver contour suspicious of cirrhosis.  Spleen reported as normal size. - Ultrasound abdomen on 03/08/2021 showed increased echogenicity in the liver thought to be secondary to hepatic steatosis - Labs from 12/11/2022 show downtrending ferritin 128, iron saturation 57%, serum iron 195. - Elevated ferritin thought to be secondary to liver disease.  - He is being actively worked up by gastroenterology for newly elevated LFTs and suspected liver cirrhosis - Patient also has had intermittent mild thrombocytopenia related to liver disease *** - PLAN: Recheck iron panel in 6 months ***  3.  Lung nodules: -  CT chest on 10/11/2021 showed stable right lung nodules.  His PMD ordered a CT chest without contrast on  06/06/2022 at [facility] which showed a stable 3 mm right lung nodule.  No follow-up necessary based on radiologist assessment, but he continues to meet criteria for LDCT chest annually for lung cancer screening. *** -*** - PLAN: Scheduled for LDCT chest and wound refer patient's name to [facility] Nurse Coordinator ***  4.  Social/family history: - He is currently disabled.  Worked at [employer].  Lives at home with his wife. - He smokes sporadically.  He quit smoking regularly about 2 to 3 years ago.  Smoked 2 packs/day for 35 years. - He drinks 7-8 beers per night.  Previously, he did drink heavier than this. - No family history of polycythemia or hemochromatosis.  Mother died of liver cancer.  2 maternal uncles died of liver cancer.  1 maternal aunt died of cancer. - *** - PLAN: ***   PLAN SUMMARY: >> *** >> *** >> ***   [facility] **VISIT SUMMARY & IMPORTANT INSTRUCTIONS **   You were seen today by Rojelio Brenner PA-C for your ***.    *** ***  *** ***  LABS: Return in ***   OTHER TESTS: ***  MEDICATIONS: ***  FOLLOW-UP APPOINTMENT: ***     REVIEW OF SYSTEMS: ***  Review of Systems - Oncology   PHYSICAL EXAM:  ECOG PERFORMANCE STATUS: {CHL ONC ECOG XB:[id number]} *** There were no vitals filed for this visit. There were no vitals filed for this visit. Physical Exam  PAST MEDICAL/SURGICAL HISTORY:  Past Medical History:  Diagnosis Date   Alcoholism    Anxiety    Arthritis    Depression    Diabetes    Gallstones    GERD (gastroesophageal reflux disease)    Hypertension    Kidney stones    Obesity    Past Surgical History:  Procedure Laterality Date   ANKLE SURGERY     left   KNEE SURGERY     right   LEG SURGERY     bilateral    SOCIAL HISTORY:  Social History   Socioeconomic History   Marital status: Married    Spouse name: Not on file   Number of children: Not on file   Years of education: Not on file    Highest education level: Not on file  Occupational History   Not on file  Tobacco Use   Smoking status: Some Days    Packs/day: 2.00    Years: 20.00    Additional pack years: 0.00    Total pack years: 40.00    Types: Cigarettes    Last attempt to quit: 08/19/2012    Years since quitting: 10.3   Smokeless tobacco: Never  Substance and Sexual Activity   Alcohol use: Yes    Comment: 7-8 beer per day   Drug use: No   Sexual activity: Not on file  Other Topics Concern   Not on file  Social History Narrative   Not on file   Social Determinants of Health   Financial Resource Strain: Not on file  Food Insecurity: Not on file  Transportation Needs: Not on file  Physical Activity: Not on file  Stress: Not on file  Social Connections: Not on file  Intimate Partner Violence: Not on file    FAMILY HISTORY:  Family History  Problem Relation Age of Onset   Liver cancer Mother  Cancer Unknown        mult uncles   Cancer Sister    Colon cancer Neg Hx    Stomach cancer Neg Hx     CURRENT MEDICATIONS:  Outpatient Encounter Medications as of 12/18/2022  Medication Sig   ALPRAZolam (XANAX) 1 MG tablet Take 1 mg by mouth 3 (three) times daily.   ibuprofen (ADVIL) 800 MG tablet Take 800 mg by mouth 2 (two) times daily.   lisinopril (PRINIVIL,ZESTRIL) 5 MG tablet Take 5 mg by mouth daily.   Multiple Vitamins-Minerals (MULTIVITAMIN WITH MINERALS) tablet Take 1 tablet by mouth daily. (Patient not taking: Reported on 12/17/2022)   omeprazole (PRILOSEC) 20 MG capsule Take 20 mg by mouth daily.   No facility-administered encounter medications on file as of 12/18/2022.    ALLERGIES:  Allergies  Allergen Reactions   Clindamycin/Lincomycin Rash   Penicillins Nausea And Vomiting and Other (See Comments)    Beak out in "sweats"    LABORATORY DATA:  I have reviewed the labs as listed.  CBC    Component Value Date/Time   WBC 7.0 12/11/2022 1441   RBC 5.80 12/11/2022 1441   HGB  19.2 (H) 12/11/2022 1441   HCT 53.9 (H) 12/11/2022 1441   PLT 145 (L) 12/11/2022 1441   MCV 92.9 12/11/2022 1441   MCH 33.1 12/11/2022 1441   MCHC 35.6 12/11/2022 1441   RDW 12.0 12/11/2022 1441   LYMPHSABS 1.8 12/11/2022 1441   MONOABS 0.6 12/11/2022 1441   EOSABS 0.1 12/11/2022 1441   BASOSABS 0.1 12/11/2022 1441      Latest Ref Rng & Units 09/12/2010    1:01 PM 09/10/2010    3:30 PM 03/08/2008    4:30 AM  CMP  Glucose 70 - 99 mg/dL 409  811  88   BUN 6 - 23 mg/dL 11  13  13    Creatinine 0.4 - 1.5 mg/dL 9.14  7.82  9.56   Sodium 135 - 145 mEq/L 133  137  135   Potassium 3.5 - 5.1 mEq/L 3.4  4.0  4.0   Chloride 96 - 112 mEq/L 103  106  104   CO2 19 - 32 mEq/L 23  22  24    Calcium 8.4 - 10.5 mg/dL 8.7  9.1  8.1     DIAGNOSTIC IMAGING:  I have independently reviewed the relevant imaging and discussed with the patient.   WRAP UP:  All questions were answered. The patient knows to call the clinic with any problems, questions or concerns.  Medical decision making: ***  Time spent on visit: I spent *** minutes counseling the patient face to face. The total time spent in the appointment was *** minutes and more than 50% was on counseling.  Carnella Guadalajara, PA-C  ***

## 2022-12-17 NOTE — Telephone Encounter (Signed)
[facility]  [place] scheduled for Wednesday,12/31/22, arrive at 10:15 am to check in, nothing to eat or drink 6-8 hours prior.  TCS/EGD w/ Dr.Rourk ASA 3

## 2022-12-17 NOTE — Patient Instructions (Addendum)
Please have blood work completed at [facility].  We will arrange to have an ultrasound of your abdomen at [facility].  We will arrange you to have an upper endoscopy and colonoscopy with Dr. Jena Gauss at [facility] in the near future.  As we discussed, you will need to work towards complete alcohol abstinence due to your cirrhosis.  You can take Tylenol if needed.  No more than 2000 mg of Tylenol per 24 hours.  Nutrition recommendations:  High-protein diet from a primarily plant-based diet. Avoid red meat.  No raw or undercooked meat, seafood, or shellfish. Low-fat/cholesterol/carbohydrate diet. Limit sodium to no more than 2000 mg/day including everything that you eat and drink. Recommend at least 30 minutes of aerobic and resistance exercise 3 days/week.  We will follow-up with you after your procedures.  Do not hesitate to call sooner if you have questions or concerns.  Ermalinda Memos, PA-C [facility]

## 2022-12-18 ENCOUNTER — Inpatient Hospital Stay: Payer: PPO

## 2022-12-18 ENCOUNTER — Inpatient Hospital Stay (HOSPITAL_BASED_OUTPATIENT_CLINIC_OR_DEPARTMENT_OTHER): Payer: PPO | Admitting: Physician Assistant

## 2022-12-18 ENCOUNTER — Encounter: Payer: Self-pay | Admitting: Physician Assistant

## 2022-12-18 ENCOUNTER — Encounter: Payer: Self-pay | Admitting: Gastroenterology

## 2022-12-18 VITALS — BP 147/82 | HR 70 | Temp 98.7°F | Resp 18 | Wt 240.5 lb

## 2022-12-18 DIAGNOSIS — D751 Secondary polycythemia: Secondary | ICD-10-CM

## 2022-12-18 DIAGNOSIS — R7989 Other specified abnormal findings of blood chemistry: Secondary | ICD-10-CM

## 2022-12-18 NOTE — Patient Instructions (Signed)
[facility]  Discharge Instructions: Thank you for choosing [facility] to provide your oncology and hematology care.  If you have a lab appointment with the Cancer Center - please note that after April 8th, 2024, all labs will be drawn in the cancer center.  You do not have to check in or register with the main entrance as you have in the past but will complete your check-in in the cancer center.  Wear comfortable clothing and clothing appropriate for easy access to any Portacath or PICC line.   We strive to give you quality time with your provider. You may need to reschedule your appointment if you arrive late (15 or more minutes).  Arriving late affects you and other patients whose appointments are after yours.  Also, if you miss three or more appointments without notifying the office, you may be dismissed from the clinic at the provider's discretion.      For prescription refill requests, have your pharmacy contact our office and allow 72 hours for refills to be completed.    Today you received the following chemotherapy and/or immunotherapy agents phlebotomy. Therapeutic Phlebotomy, Care After The following information offers guidance on how to care for yourself after your procedure. Your health care provider may also give you more specific instructions. If you have problems or questions, contact your health care provider. What can I expect after the procedure? After therapeutic phlebotomy, it is common to have: Light-headedness or dizziness. You may feel faint. Nausea. Tiredness (fatigue). Follow these instructions at home: Eating and drinking Be sure to eat well-balanced meals for the next 24 hours. Drink enough fluid to keep your urine pale yellow. Avoid drinking alcohol on the day that you had the procedure. Activity  Return to your normal activities as told by your health care provider. Most people can go back to their normal activities right  away. Avoid activities that take a lot of effort for about 5 hours after the procedure. Athletes should avoid strenuous exercise for at least 12 hours. Avoid heavy lifting or pulling for about 5 hours after the procedure. Do not lift anything that is heavier than 10 lb (4.5 kg). Change positions slowly for the remainder of the day, like from sitting to standing. This can help prevent light-headedness or fainting. If you feel light-headed, lie down until the feeling goes away. Needle insertion site care  Keep your bandage (dressing) dry. You can remove the bandage after about 5 hours or as told by your health care provider. If you have bleeding from the needle insertion site, raise (elevate) your arm and press firmly on the site until the bleeding stops. If you have bruising at the site, apply ice to the area. To do this: Put ice in a plastic bag. Place a towel between your skin and the bag. Leave the ice on for 20 minutes, 2-3 times a day for the first 24 hours. Remove the ice if your skin turns bright red so you do not damage the area. If the swelling does not go away after 24 hours, apply a warm, moist cloth (warm compress) to the area for 20 minutes, 2-3 times a day. General instructions Do not use any products that contain nicotine or tobacco, like cigarettes, chewing tobacco, and vaping devices, such as e-cigarettes, for at least 30 minutes after the procedure. If you need help quitting, ask your health care provider. Keep all follow-up visits. You may need to continue having regular blood tests   and therapeutic phlebotomy treatments as directed. Contact a health care provider if: You have redness, swelling, or pain at the needle insertion site. Fluid or blood is coming from the needle insertion site. Pus or a bad smell is coming from the needle insertion site. The needle insertion site feels warm to the touch. You feel light-headed, dizzy, or nauseous, and the feeling does not go  away. You have new bruising at the needle insertion site. You feel weaker than normal. You have a fever or chills. Get help right away if: You have chest pain. You have trouble breathing. You have severe nausea or vomiting. Summary After the procedure, it is common to have some light-headedness, dizziness, nausea, or tiredness (fatigue). Be sure to eat well-balanced meals for the next 24 hours. Drink enough fluid to keep your urine pale yellow. Return to your normal activities as told by your health care provider. Keep all follow-up visits. You may need to continue having regular blood tests and therapeutic phlebotomy treatments as directed. This information is not intended to replace advice given to you by your health care provider. Make sure you discuss any questions you have with your health care provider. Document Revised: 02/06/2021 Document Reviewed: 02/06/2021 Elsevier Patient Education  2023 Elsevier Inc.       To help prevent nausea and vomiting after your treatment, we encourage you to take your nausea medication as directed.  BELOW ARE SYMPTOMS THAT SHOULD BE REPORTED IMMEDIATELY: *FEVER GREATER THAN 100.4 F (38 C) OR HIGHER *CHILLS OR SWEATING *NAUSEA AND VOMITING THAT IS NOT CONTROLLED WITH YOUR NAUSEA MEDICATION *UNUSUAL SHORTNESS OF BREATH *UNUSUAL BRUISING OR BLEEDING *URINARY PROBLEMS (pain or burning when urinating, or frequent urination) *BOWEL PROBLEMS (unusual diarrhea, constipation, pain near the anus) TENDERNESS IN MOUTH AND THROAT WITH OR WITHOUT PRESENCE OF ULCERS (sore throat, sores in mouth, or a toothache) UNUSUAL RASH, SWELLING OR PAIN  UNUSUAL VAGINAL DISCHARGE OR ITCHING   Items with * indicate a potential emergency and should be followed up as soon as possible or go to the Emergency Department if any problems should occur.  Please show the CHEMOTHERAPY ALERT CARD or IMMUNOTHERAPY ALERT CARD at check-in to the Emergency Department and triage  nurse.  Should you have questions after your visit or need to cancel or reschedule your appointment, please contact [facility] [phone]  and follow the prompts.  Office hours are 8:00 a.m. to 4:30 p.m. Monday - Friday. Please note that voicemails left after 4:00 p.m. may not be returned until the following business day.  We are closed weekends and major holidays. You have access to a nurse at all times for urgent questions. Please call the main number to the clinic [phone] and follow the prompts.  For any non-urgent questions, you may also contact your provider using MyChart. We now offer e-Visits for anyone 18 and older to request care online for non-urgent symptoms. For details visit [email or web address].   Also download the MyChart app! Go to the app store, search "MyChart", open the app, select [facility], and log in with your MyChart username and password.   

## 2022-12-18 NOTE — Patient Instructions (Signed)
[facility] at [facility] **VISIT SUMMARY & IMPORTANT INSTRUCTIONS **   You were seen today by Rojelio Brenner PA-C for your elevated hemoglobin/red blood cells.    Your elevated hemoglobin and red blood cells are thought to be related to smoking-related lung damage (possible COPD). Having hemoglobin to high places you at increased risk of blood clot, heart attack, and stroke. Recommend you start taking aspirin 81 mg ("baby aspirin") daily to help prevent risk of blood clots. He will receive phlebotomy today. Will recheck your blood and perform a possible phlebotomy in 3 months. We will see you for labs, office visit, and possible phlebotomy in 6 months.  ** Thank you for trusting me with your healthcare!  I strive to provide all of my patients with quality care at each visit.  If you receive a survey for this visit, I would be so grateful to you for taking the time to provide feedback.  Thank you in advance!  ~ Layla Gramm                   Dr. Doreatha Massed   &   Rojelio Brenner, PA-C   - - - - - - - - - - - - - - - - - -    Thank you for choosing  [facility] Center at [facility] to provide your oncology and hematology care.  To afford each patient quality time with our provider, please arrive at least 15 minutes before your scheduled appointment time.   If you have a lab appointment with the Cancer Center please come in thru the Main Entrance and check in at the main information desk.  You need to re-schedule your appointment should you arrive 10 or more minutes late.  We strive to give you quality time with our providers, and arriving late affects you and other patients whose appointments are after yours.  Also, if you no show three or more times for appointments you may be dismissed from the clinic at the providers discretion.     Again, thank you for choosing [facility].  Our hope is that these requests will decrease the  amount of time that you wait before being seen by our physicians.       _____________________________________________________________  Should you have questions after your visit to [facility], please contact our office at [phone] and follow the prompts.  Our office hours are 8:00 a.m. and 4:30 p.m. Monday - Friday.  Please note that voicemails left after 4:00 p.m. may not be returned until the following business day.  We are closed weekends and major holidays.  You do have access to a nurse 24-7, just call the main number to the clinic [phone] and do not press any options, hold on the line and a nurse will answer the phone.    For prescription refill requests, have your pharmacy contact our office and allow 72 hours.

## 2022-12-18 NOTE — Progress Notes (Signed)
[name] presents for therapeutic phlebotomy per MD orders. Last HGB 19.2 / HCT 53.9 on 12-11-22 . Vital signs stable prior to procedure. Procedure started at 15:16 pm and ended at 15:33 pm. 500 mls of blood removed. Patient denies any dizziness , lightheadedness, or feeling faint.  Gauze and coban applied to site. Vital signs stable at completion of procedure. Patient has no complaints at this time. Alert and oriented x 3. Discharged in stable condition.    Phlebotomy given today per MD orders. Tolerated infusion without adverse affects. Vital signs stable. No complaints at this time. Discharged from clinic ambulatory in stable condition. Alert and oriented x 3. F/U with [facility] as scheduled.

## 2022-12-19 LAB — IGG, IGA, IGM
IgA: 265 mg/dL (ref 90–386)
IgG (Immunoglobin G), Serum: 1191 mg/dL (ref 603–1613)
IgM (Immunoglobulin M), Srm: 208 mg/dL — ABNORMAL HIGH (ref 20–172)

## 2022-12-19 LAB — ANA: Anti Nuclear Antibody (ANA): NEGATIVE

## 2022-12-19 LAB — MITOCHONDRIAL ANTIBODIES: Mitochondrial M2 Ab, IgG: <20 Units (ref 0.0–20.0)

## 2022-12-19 LAB — ANTI-SMOOTH MUSCLE ANTIBODY, IGG: F-Actin IgG: 13 Units (ref 0–19)

## 2022-12-19 LAB — CERULOPLASMIN: Ceruloplasmin: 24.8 mg/dL (ref 16.0–31.0)

## 2022-12-19 LAB — AFP TUMOR MARKER: AFP, Serum, Tumor Marker: 5.4 ng/mL (ref 0.0–8.4)

## 2022-12-22 ENCOUNTER — Other Ambulatory Visit: Payer: Self-pay

## 2022-12-22 DIAGNOSIS — D751 Secondary polycythemia: Secondary | ICD-10-CM

## 2022-12-22 DIAGNOSIS — R7989 Other specified abnormal findings of blood chemistry: Secondary | ICD-10-CM

## 2022-12-22 LAB — ALPHA-1-ANTITRYPSIN PHENOTYP: A-1 Antitrypsin, Ser: 161 mg/dL (ref 101–187)

## 2022-12-23 NOTE — Telephone Encounter (Signed)
Received VM from pt, called back, LMOVM 

## 2022-12-24 ENCOUNTER — Encounter: Payer: Self-pay | Admitting: *Deleted

## 2022-12-24 ENCOUNTER — Other Ambulatory Visit: Payer: Self-pay | Admitting: *Deleted

## 2022-12-24 MED ORDER — PEG 3350-KCL-NA BICARB-NACL 420 G PO SOLR: 4000.0000 mL | Freq: Once | ORAL | 0 refills | Status: AC

## 2022-12-25 ENCOUNTER — Encounter: Payer: Self-pay | Admitting: *Deleted

## 2022-12-31 ENCOUNTER — Ambulatory Visit (HOSPITAL_COMMUNITY)
Admission: RE | Admit: 2022-12-31 | Discharge: 2022-12-31 | Disposition: A | Discharge status: Home / Self Care | Payer: PPO | Source: Ambulatory Visit | Attending: Gastroenterology | Admitting: Gastroenterology

## 2022-12-31 DIAGNOSIS — K7460 Unspecified cirrhosis of liver: Secondary | ICD-10-CM | POA: Diagnosis not present

## 2022-12-31 DIAGNOSIS — K8020 Calculus of gallbladder without cholecystitis without obstruction: Secondary | ICD-10-CM | POA: Diagnosis not present

## 2022-12-31 DIAGNOSIS — R7989 Other specified abnormal findings of blood chemistry: Secondary | ICD-10-CM | POA: Diagnosis not present

## 2023-01-07 ENCOUNTER — Encounter: Payer: Self-pay | Admitting: *Deleted

## 2023-01-07 ENCOUNTER — Other Ambulatory Visit: Payer: Self-pay | Admitting: Gastroenterology

## 2023-01-07 DIAGNOSIS — R4182 Altered mental status, unspecified: Secondary | ICD-10-CM

## 2023-01-07 DIAGNOSIS — K7460 Unspecified cirrhosis of liver: Secondary | ICD-10-CM

## 2023-01-08 ENCOUNTER — Encounter: Payer: Self-pay | Admitting: *Deleted

## 2023-01-08 ENCOUNTER — Other Ambulatory Visit: Payer: Self-pay

## 2023-01-08 DIAGNOSIS — D751 Secondary polycythemia: Secondary | ICD-10-CM

## 2023-01-12 ENCOUNTER — Inpatient Hospital Stay: Payer: PPO

## 2023-01-14 ENCOUNTER — Other Ambulatory Visit (HOSPITAL_COMMUNITY)
Admission: RE | Admit: 2023-01-14 | Discharge: 2023-01-14 | Disposition: A | Discharge status: Home / Self Care | Payer: PPO | Source: Ambulatory Visit | Attending: Gastroenterology | Admitting: Gastroenterology

## 2023-01-14 ENCOUNTER — Other Ambulatory Visit: Payer: Self-pay | Admitting: Gastroenterology

## 2023-01-14 DIAGNOSIS — K7460 Unspecified cirrhosis of liver: Secondary | ICD-10-CM | POA: Insufficient documentation

## 2023-01-14 DIAGNOSIS — E7220 Disorder of urea cycle metabolism, unspecified: Secondary | ICD-10-CM

## 2023-01-14 DIAGNOSIS — R4182 Altered mental status, unspecified: Secondary | ICD-10-CM | POA: Diagnosis not present

## 2023-01-14 LAB — AMMONIA: Ammonia: 37 umol/L — ABNORMAL HIGH (ref 9–35)

## 2023-01-14 MED ORDER — LACTULOSE 10 GM/15ML PO SOLN
10.0000 g | Freq: Every day | ORAL | 1 refills | Status: DC
Start: 2023-01-14 — End: 2023-06-24

## 2023-01-15 NOTE — Telephone Encounter (Signed)
Spoke with patient's wife who states patient is currently taking stool softeners and having about 3 Bms a day. Advised to stop the stool softeners and start lactulose as prescribed with a goal of 3-4 bowel movements every day.  She will monitor for any worsening confusion let me know if this occurs.  Discussed his current state of cirrhosis, MELD score, Child pugh score, life expectancy, and things to monitor for including jaundice, mental status changes, LE edema, and abdominal distension.

## 2023-01-30 NOTE — Patient Instructions (Signed)
[name]  01/30/2023     @PREFPERIOPPHARMACY @   Your procedure is scheduled on  02/04/2023.   Report to [facility] at  0845  A.M.   Call this number if you have problems the morning of surgery:  [phone]  If you experience any cold or flu symptoms such as cough, fever, chills, shortness of breath, etc. between now and your scheduled surgery, please notify us at the above number.   Remember:  Follow the diet and prep instructions given to you by the office.      DO NOT take any medications for diabetes the morning of your procedure.     Take these medicines the morning of surgery with A SIP OF WATER                     xanax(if needed), omeprazole.    Do not wear jewelry, make-up or nail polish, including gel polish,  artificial nails, or any other type of covering on natural nails (fingers and  toes).  Do not wear lotions, powders, or perfumes, or deodorant.  Do not shave 48 hours prior to surgery.  Men may shave face and neck.  Do not bring valuables to the hospital.  [facility] is not responsible for any belongings or valuables.  Contacts, dentures or bridgework may not be worn into surgery.  Leave your suitcase in the car.  After surgery it may be brought to your room.  For patients admitted to the hospital, discharge time will be determined by your treatment team.  Patients discharged the day of surgery will not be allowed to drive home and must have someone with them for 24 hours.    Special instructions:   DO NOT smoke tobacco or vape for 24 hours before your procedure.  Please read over the following fact sheets that you were given. Anesthesia Post-op Instructions and Care and Recovery After Surgery      Upper Endoscopy, Adult, Care After After the procedure, it is common to have a sore throat. It is also common to have: Mild stomach pain or discomfort. Bloating. Nausea. Follow these instructions at home: The instructions below may  help you care for yourself at home. Your health care provider may give you more instructions. If you have questions, ask your health care provider. If you were given a sedative during the procedure, it can affect you for several hours. Do not drive or operate machinery until your health care provider says that it is safe. If you will be going home right after the procedure, plan to have a responsible adult: Take you home from the hospital or clinic. You will not be allowed to drive. Care for you for the time you are told. Follow instructions from your health care provider about what you may eat and drink. Return to your normal activities as told by your health care provider. Ask your health care provider what activities are safe for you. Take over-the-counter and prescription medicines only as told by your health care provider. Contact a health care provider if you: Have a sore throat that lasts longer than one day. Have trouble swallowing. Have a fever. Get help right away if you: Vomit blood or your vomit looks like coffee grounds. Have bloody, black, or tarry stools. Have a very bad sore throat or you cannot swallow. Have difficulty breathing or very bad pain in your chest or abdomen. These symptoms may be an emergency.  Get help right away. Call 911. Do not wait to see if the symptoms will go away. Do not drive yourself to the hospital. Summary After the procedure, it is common to have a sore throat, mild stomach discomfort, bloating, and nausea. If you were given a sedative during the procedure, it can affect you for several hours. Do not drive until your health care provider says that it is safe. Follow instructions from your health care provider about what you may eat and drink. Return to your normal activities as told by your health care provider. This information is not intended to replace advice given to you by your health care provider. Make sure you discuss any questions you have  with your health care provider. Document Revised: 11/20/2021 Document Reviewed: 11/20/2021 Elsevier Patient Education  2024 [employer]. Colonoscopy, Adult, Care After The following information offers guidance on how to care for yourself after your procedure. Your health care provider may also give you more specific instructions. If you have problems or questions, contact your health care provider. What can I expect after the procedure? After the procedure, it is common to have: A small amount of blood in your stool for 24 hours after the procedure. Some gas. Mild cramping or bloating of your abdomen. Follow these instructions at home: Eating and drinking  Drink enough fluid to keep your urine pale yellow. Follow instructions from your health care provider about eating or drinking restrictions. Resume your normal diet as told by your health care provider. Avoid heavy or fried foods that are hard to digest. Activity Rest as told by your health care provider. Avoid sitting for a long time without moving. Get up to take short walks every 1-2 hours. This is important to improve blood flow and breathing. Ask for help if you feel weak or unsteady. Return to your normal activities as told by your health care provider. Ask your health care provider what activities are safe for you. Managing cramping and bloating  Try walking around when you have cramps or feel bloated. If directed, apply heat to your abdomen as told by your health care provider. Use the heat source that your health care provider recommends, such as a moist heat pack or a heating pad. Place a towel between your skin and the heat source. Leave the heat on for 20-30 minutes. Remove the heat if your skin turns bright red. This is especially important if you are unable to feel pain, heat, or cold. You have a greater risk of getting burned. General instructions If you were given a sedative during the procedure, it can affect you for  several hours. Do not drive or operate machinery until your health care provider says that it is safe. For the first 24 hours after the procedure: Do not sign important documents. Do not drink alcohol. Do your regular daily activities at a slower pace than normal. Eat soft foods that are easy to digest. Take over-the-counter and prescription medicines only as told by your health care provider. Keep all follow-up visits. This is important. Contact a health care provider if: You have blood in your stool 2-3 days after the procedure. Get help right away if: You have more than a small spotting of blood in your stool. You have large blood clots in your stool. You have swelling of your abdomen. You have nausea or vomiting. You have a fever. You have increasing pain in your abdomen that is not relieved with medicine. These symptoms may be an emergency.  Get help right away. Call 911. Do not wait to see if the symptoms will go away. Do not drive yourself to the hospital. Summary After the procedure, it is common to have a small amount of blood in your stool. You may also have mild cramping and bloating of your abdomen. If you were given a sedative during the procedure, it can affect you for several hours. Do not drive or operate machinery until your health care provider says that it is safe. Get help right away if you have a lot of blood in your stool, nausea or vomiting, a fever, or increased pain in your abdomen. This information is not intended to replace advice given to you by your health care provider. Make sure you discuss any questions you have with your health care provider. Document Revised: 09/23/2022 Document Reviewed: 04/03/2021 Elsevier Patient Education  2024 Elsevier Inc. Monitored Anesthesia Care, Care After The following information offers guidance on how to care for yourself after your procedure. Your health care provider may also give you more specific instructions. If you have  problems or questions, contact your health care provider. What can I expect after the procedure? After the procedure, it is common to have: Tiredness. Little or no memory about what happened during or after the procedure. Impaired judgment when it comes to making decisions. Nausea or vomiting. Some trouble with balance. Follow these instructions at home: For the time period you were told by your health care provider:  Rest. Do not participate in activities where you could fall or become injured. Do not drive or use machinery. Do not drink alcohol. Do not take sleeping pills or medicines that cause drowsiness. Do not make important decisions or sign legal documents. Do not take care of children on your own. Medicines Take over-the-counter and prescription medicines only as told by your health care provider. If you were prescribed antibiotics, take them as told by your health care provider. Do not stop using the antibiotic even if you start to feel better. Eating and drinking Follow instructions from your health care provider about what you may eat and drink. Drink enough fluid to keep your urine pale yellow. If you vomit: Drink clear fluids slowly and in small amounts as you are able. Clear fluids include water, ice chips, low-calorie sports drinks, and fruit juice that has water added to it (diluted fruit juice). Eat light and bland foods in small amounts as you are able. These foods include bananas, applesauce, rice, lean meats, toast, and crackers. General instructions  Have a responsible adult stay with you for the time you are told. It is important to have someone help care for you until you are awake and alert. If you have sleep apnea, surgery and some medicines can increase your risk for breathing problems. Follow instructions from your health care provider about wearing your sleep device: When you are sleeping. This includes during daytime naps. While taking prescription pain  medicines, sleeping medicines, or medicines that make you drowsy. Do not use any products that contain nicotine or tobacco. These products include cigarettes, chewing tobacco, and vaping devices, such as e-cigarettes. If you need help quitting, ask your health care provider. Contact a health care provider if: You feel nauseous or vomit every time you eat or drink. You feel light-headed. You are still sleepy or having trouble with balance after 24 hours. You get a rash. You have a fever. You have redness or swelling around the IV site. Get help right away if:  You have trouble breathing. You have new confusion after you get home. These symptoms may be an emergency. Get help right away. Call 911. Do not wait to see if the symptoms will go away. Do not drive yourself to the hospital. This information is not intended to replace advice given to you by your health care provider. Make sure you discuss any questions you have with your health care provider. Document Revised: 01/06/2022 Document Reviewed: 01/06/2022 Elsevier Patient Education  2024 [employer].

## 2023-02-02 ENCOUNTER — Encounter (HOSPITAL_COMMUNITY): Payer: Self-pay

## 2023-02-02 ENCOUNTER — Encounter (HOSPITAL_COMMUNITY)
Admission: RE | Admit: 2023-02-02 | Discharge: 2023-02-02 | Disposition: A | Discharge status: Home / Self Care | Payer: PPO | Source: Ambulatory Visit | Attending: Internal Medicine | Admitting: Internal Medicine

## 2023-02-02 VITALS — BP 151/85 | HR 57 | Temp 97.8°F | Resp 18 | Ht 69.0 in | Wt 240.5 lb

## 2023-02-02 DIAGNOSIS — Z01818 Encounter for other preprocedural examination: Secondary | ICD-10-CM | POA: Diagnosis not present

## 2023-02-02 DIAGNOSIS — K7030 Alcoholic cirrhosis of liver without ascites: Secondary | ICD-10-CM | POA: Insufficient documentation

## 2023-02-02 DIAGNOSIS — I10 Essential (primary) hypertension: Secondary | ICD-10-CM | POA: Insufficient documentation

## 2023-02-02 DIAGNOSIS — R9431 Abnormal electrocardiogram [ECG] [EKG]: Secondary | ICD-10-CM | POA: Insufficient documentation

## 2023-02-02 HISTORY — DX: Other complications of anesthesia, initial encounter: T88.59XA

## 2023-02-02 HISTORY — DX: Personal history of urinary calculi: Z87.442

## 2023-02-02 HISTORY — DX: Hemochromatosis, unspecified: E83.119

## 2023-02-02 LAB — CBC WITH DIFFERENTIAL/PLATELET
Abs Immature Granulocytes: 0.01 10*3/uL (ref 0.00–0.07)
Basophils Absolute: 0.1 10*3/uL (ref 0.0–0.1)
Basophils Relative: 1 %
Eosinophils Absolute: 0.2 10*3/uL (ref 0.0–0.5)
Eosinophils Relative: 3 %
HCT: 46.7 % (ref 39.0–52.0)
Hemoglobin: 16.6 g/dL (ref 13.0–17.0)
Immature Granulocytes: 0 %
Lymphocytes Relative: 40 %
Lymphs Abs: 2 10*3/uL (ref 0.7–4.0)
MCH: 32.8 pg (ref 26.0–34.0)
MCHC: 35.5 g/dL (ref 30.0–36.0)
MCV: 92.3 fL (ref 80.0–100.0)
Monocytes Absolute: 0.5 10*3/uL (ref 0.1–1.0)
Monocytes Relative: 9 %
Neutro Abs: 2.4 10*3/uL (ref 1.7–7.7)
Neutrophils Relative %: 47 %
Platelets: 142 10*3/uL — ABNORMAL LOW (ref 150–400)
RBC: 5.06 MIL/uL (ref 4.22–5.81)
RDW: 11.8 % (ref 11.5–15.5)
WBC: 5.1 10*3/uL (ref 4.0–10.5)
nRBC: 0 % (ref 0.0–0.2)

## 2023-02-02 LAB — COMPREHENSIVE METABOLIC PANEL
ALT: 31 U/L (ref 0–44)
AST: 30 U/L (ref 15–41)
Albumin: 3.5 g/dL (ref 3.5–5.0)
Alkaline Phosphatase: 59 U/L (ref 38–126)
Anion gap: 8 (ref 5–15)
BUN: 13 mg/dL (ref 6–20)
CO2: 26 mmol/L (ref 22–32)
Calcium: 8.9 mg/dL (ref 8.9–10.3)
Chloride: 102 mmol/L (ref 98–111)
Creatinine, Ser: 0.9 mg/dL (ref 0.61–1.24)
GFR, Estimated: >60 mL/min (ref >60)
Glucose, Bld: 94 mg/dL (ref 70–99)
Potassium: 4 mmol/L (ref 3.5–5.1)
Sodium: 136 mmol/L (ref 135–145)
Total Bilirubin: 1 mg/dL (ref 0.3–1.2)
Total Protein: 6.8 g/dL (ref 6.5–8.1)

## 2023-02-02 LAB — PROTIME-INR
INR: 1.1 (ref 0.8–1.2)
Prothrombin Time: 14.1 seconds (ref 11.4–15.2)

## 2023-02-04 ENCOUNTER — Ambulatory Visit (HOSPITAL_COMMUNITY): Payer: PPO | Admitting: Anesthesiology

## 2023-02-04 ENCOUNTER — Encounter (HOSPITAL_COMMUNITY): Payer: Self-pay | Admitting: Internal Medicine

## 2023-02-04 ENCOUNTER — Ambulatory Visit (HOSPITAL_BASED_OUTPATIENT_CLINIC_OR_DEPARTMENT_OTHER): Payer: PPO | Admitting: Anesthesiology

## 2023-02-04 ENCOUNTER — Encounter (HOSPITAL_COMMUNITY)
Admission: RE | Disposition: A | Discharge status: Home / Self Care | Payer: Self-pay | Source: Home / Self Care | Attending: Internal Medicine

## 2023-02-04 ENCOUNTER — Ambulatory Visit (HOSPITAL_COMMUNITY)
Admission: RE | Admit: 2023-02-04 | Discharge: 2023-02-04 | Disposition: A | Discharge status: Home / Self Care | Payer: PPO | Attending: Internal Medicine | Admitting: Internal Medicine

## 2023-02-04 DIAGNOSIS — R7989 Other specified abnormal findings of blood chemistry: Secondary | ICD-10-CM

## 2023-02-04 DIAGNOSIS — K766 Portal hypertension: Secondary | ICD-10-CM | POA: Insufficient documentation

## 2023-02-04 DIAGNOSIS — Z86010 Personal history of colonic polyps: Secondary | ICD-10-CM

## 2023-02-04 DIAGNOSIS — K5730 Diverticulosis of large intestine without perforation or abscess without bleeding: Secondary | ICD-10-CM | POA: Insufficient documentation

## 2023-02-04 DIAGNOSIS — Z1211 Encounter for screening for malignant neoplasm of colon: Secondary | ICD-10-CM | POA: Insufficient documentation

## 2023-02-04 DIAGNOSIS — K640 First degree hemorrhoids: Secondary | ICD-10-CM

## 2023-02-04 DIAGNOSIS — K7460 Unspecified cirrhosis of liver: Secondary | ICD-10-CM

## 2023-02-04 DIAGNOSIS — K3189 Other diseases of stomach and duodenum: Secondary | ICD-10-CM

## 2023-02-04 DIAGNOSIS — F419 Anxiety disorder, unspecified: Secondary | ICD-10-CM | POA: Insufficient documentation

## 2023-02-04 DIAGNOSIS — E119 Type 2 diabetes mellitus without complications: Secondary | ICD-10-CM | POA: Diagnosis not present

## 2023-02-04 DIAGNOSIS — I10 Essential (primary) hypertension: Secondary | ICD-10-CM | POA: Insufficient documentation

## 2023-02-04 DIAGNOSIS — Z87891 Personal history of nicotine dependence: Secondary | ICD-10-CM | POA: Diagnosis not present

## 2023-02-04 DIAGNOSIS — K6389 Other specified diseases of intestine: Secondary | ICD-10-CM | POA: Insufficient documentation

## 2023-02-04 DIAGNOSIS — K219 Gastro-esophageal reflux disease without esophagitis: Secondary | ICD-10-CM | POA: Insufficient documentation

## 2023-02-04 HISTORY — PX: ESOPHAGOGASTRODUODENOSCOPY (EGD) WITH PROPOFOL: SHX5813

## 2023-02-04 HISTORY — PX: COLONOSCOPY WITH PROPOFOL: SHX5780

## 2023-02-04 LAB — GLUCOSE, CAPILLARY: Glucose-Capillary: 96 mg/dL (ref 70–99)

## 2023-02-04 SURGERY — COLONOSCOPY WITH PROPOFOL
Anesthesia: General

## 2023-02-04 MED ORDER — PROPOFOL 10 MG/ML IV BOLUS
INTRAVENOUS | Status: DC | PRN
  Administered 2023-02-04: 20 mg via INTRAVENOUS
  Administered 2023-02-04: 200 mg via INTRAVENOUS

## 2023-02-04 MED ORDER — LIDOCAINE HCL 1 % IJ SOLN
INTRAMUSCULAR | Status: DC | PRN
  Administered 2023-02-04: 50 mg via INTRADERMAL

## 2023-02-04 MED ORDER — LACTATED RINGERS IV SOLN: INTRAVENOUS | Status: DC

## 2023-02-04 MED ORDER — PROPOFOL 500 MG/50ML IV EMUL
INTRAVENOUS | Status: DC | PRN
  Administered 2023-02-04: 125 ug/kg/min via INTRAVENOUS

## 2023-02-04 NOTE — Anesthesia Preprocedure Evaluation (Signed)
Anesthesia Evaluation  Patient identified by MRN, date of birth, ID band Patient awake    Reviewed: Allergy & Precautions, H&P , NPO status , Patient's Chart, lab work & pertinent test results  History of Anesthesia Complications (+) history of anesthetic complications (high blood pressure after anesthesia)  Airway Mallampati: II  TM Distance: >3 FB Neck ROM: Full    Dental  (+) Missing, Dental Advisory Given   Pulmonary shortness of breath and with exertion, former smoker   Pulmonary exam normal breath sounds clear to auscultation       Cardiovascular hypertension, Pt. on medications Normal cardiovascular exam Rhythm:Regular Rate:Normal     Neuro/Psych  PSYCHIATRIC DISORDERS Anxiety Depression    negative neurological ROS     GI/Hepatic ,GERD  Medicated,,(+) Cirrhosis     substance abuse  alcohol use  Endo/Other  diabetes, Well Controlled, Type 2    Renal/GU negative Renal ROS  negative genitourinary   Musculoskeletal  (+) Arthritis , Osteoarthritis,    Abdominal   Peds negative pediatric ROS (+)  Hematology  (+) Blood dyscrasia (hemochromatosis)   Anesthesia Other Findings   Reproductive/Obstetrics negative OB ROS                             Anesthesia Physical Anesthesia Plan  ASA: 3  Anesthesia Plan: General   Post-op Pain Management: Minimal or no pain anticipated   Induction: Intravenous  PONV Risk Score and Plan: 1 and Treatment may vary due to age or medical condition  Airway Management Planned: Nasal Cannula and Natural Airway  Additional Equipment:   Intra-op Plan:   Post-operative Plan:   Informed Consent: I have reviewed the patients History and Physical, chart, labs and discussed the procedure including the risks, benefits and alternatives for the proposed anesthesia with the patient or authorized representative who has indicated his/her understanding and  acceptance.     Dental advisory given  Plan Discussed with: CRNA and Surgeon  Anesthesia Plan Comments:         Anesthesia Quick Evaluation

## 2023-02-04 NOTE — Op Note (Signed)
[facility] Patient Name: [name] Procedure Date: 02/04/2023 10:58 AM MRN: [id number] Date of Birth: 09-07-1960 Attending MD: Gennette Pac , MD, [id number] CSN: [id number] Age: 62 Admit Type: Outpatient Procedure:                Upper GI endoscopy Indications:              Cirrhosis rule out esophageal varices Providers:                Gennette Pac, MD, Jannett Celestine, RN, Lennice Sites Technician, Technician Referring MD:              Medicines:                Propofol per Anesthesia Complications:            No immediate complications. Estimated Blood Loss:     Estimated blood loss: none. Estimated blood loss:                            none. Procedure:                Pre-Anesthesia Assessment:                           - Prior to the procedure, a History and Physical                            was performed, and patient medications and                            allergies were reviewed. The patient's tolerance of                            previous anesthesia was also reviewed. The risks                            and benefits of the procedure and the sedation                            options and risks were discussed with the patient.                            All questions were answered, and informed consent                            was obtained. Prior Anticoagulants: The patient has                            taken no anticoagulant or antiplatelet agents. ASA                            Grade Assessment: III - A patient with severe  systemic disease. After reviewing the risks and                            benefits, the patient was deemed in satisfactory                            condition to undergo the procedure.                           After obtaining informed consent, the endoscope was                            passed under direct vision. Throughout the                            procedure, the  patient's blood pressure, pulse, and                            oxygen saturations were monitored continuously. The                            [id number]-[id number] ([id number]) scope was introduced through the                            mouth, and advanced to the second part of duodenum.                            The upper GI endoscopy was accomplished without                            difficulty. The patient tolerated the procedure                            well. Scope In: 11:15:45 AM Scope Out: 11:18:00 AM Total Procedure Duration: 0 hours 2 minutes 15 seconds  Findings:      The examined esophagus was normal. Gastric cavity empty.      Mild portal hypertensive gastropathy was found in the gastric body.       Otherwise, gastric mucosa appeared normal. Pylorus patent.      The duodenal bulb and second portion of the duodenum were normal. Impression:               - Normal esophagus.                           - Portal hypertensive gastropathy (very mild).                           - Normal duodenal bulb and second portion of the                            duodenum.                           - No specimens collected. Moderate Sedation:      Moderate (conscious)  sedation was personally administered by an       anesthesia professional. The following parameters were monitored: oxygen       saturation, heart rate, blood pressure, respiratory rate, EKG, adequacy       of pulmonary ventilation, and response to care. Recommendation:           - Patient has a contact number available for                            emergencies. The signs and symptoms of potential                            delayed complications were discussed with the                            patient. Return to normal activities tomorrow.                            Written discharge instructions were provided to the                            patient.                           - Advance diet as tolerated.                           -  Continue present medications.                           - Return to my office in 3 months. Procedure Code(s):        --- Professional ---                           [phone], Esophagogastroduodenoscopy, flexible,                            transoral; diagnostic, including collection of                            specimen(s) by brushing or washing, when performed                            (separate procedure) Diagnosis Code(s):        --- Professional ---                           K76.6, Portal hypertension                           K31.89, Other diseases of stomach and duodenum                           K74.60, Unspecified cirrhosis of liver CPT copyright 2022 American Medical Association. All rights reserved. The codes documented in this report are preliminary and upon coder review may  be revised to meet current compliance requirements. Gerrit Friends. Jena Gauss, MD  Gennette Pac, MD 02/04/2023 11:22:19 AM This report has been signed electronically. Number of Addenda: 0

## 2023-02-04 NOTE — Op Note (Signed)
[facility] Patient Name: [name] Procedure Date: 02/04/2023 10:57 AM MRN: [id number] Date of Birth: 1960/09/20 Attending MD: Gennette Pac , MD, [id number] CSN: [id number] Age: 62 Admit Type: Outpatient Procedure:                Colonoscopy Indications:              High risk colon cancer surveillance: Personal                            history of colonic polyps Providers:                Gennette Pac, MD, Jannett Celestine, RN, Lennice Sites Technician, Technician Referring MD:              Medicines:                Propofol per Anesthesia Complications:            No immediate complications. Estimated Blood Loss:     Estimated blood loss: none. Estimated blood loss:                            none. Procedure:                Pre-Anesthesia Assessment:                           - Prior to the procedure, a History and Physical                            was performed, and patient medications and                            allergies were reviewed. The patient's tolerance of                            previous anesthesia was also reviewed. The risks                            and benefits of the procedure and the sedation                            options and risks were discussed with the patient.                            All questions were answered, and informed consent                            was obtained. Prior Anticoagulants: The patient has                            taken no anticoagulant or antiplatelet agents. ASA                            Grade  Assessment: III - A patient with severe                            systemic disease. After reviewing the risks and                            benefits, the patient was deemed in satisfactory                            condition to undergo the procedure.                           After obtaining informed consent, the colonoscope                            was passed under direct vision.  Throughout the                            procedure, the patient's blood pressure, pulse, and                            oxygen saturations were monitored continuously. The                            [id number]) scope was introduced through the                            anus and advanced to the the cecum, identified by                            appendiceal orifice and ileocecal valve. The                            colonoscopy was performed without difficulty. The                            patient tolerated the procedure well. The quality                            of the bowel preparation was adequate. The                            ileocecal valve, appendiceal orifice, and rectum                            were photographed. Scope In: 11:23:57 AM Scope Out: 11:34:56 AM Scope Withdrawal Time: 0 hours 7 minutes 23 seconds  Total Procedure Duration: 0 hours 10 minutes 59 seconds  Findings:      The perianal and digital rectal examinations were normal. Diffusely       pigmented colonic mucosa.      Scattered medium-mouthed diverticula were found in the entire colon.      The exam was otherwise without abnormality on direct and retroflexion       views.      Non-bleeding  internal hemorrhoids were found during retroflexion. The       hemorrhoids were moderate, medium-sized and Grade I (internal       hemorrhoids that do not prolapse). Anal papilla also present. Impression:               - Diverticulosis in the entire examined colon.                            Melanosis coli.                           - The examination was otherwise normal on direct                            and retroflexion views.                           - Non-bleeding internal hemorrhoids. Anal papilla                           - No specimens collected. Moderate Sedation:      Moderate (conscious) sedation was personally administered by an       anesthesia professional. The following parameters were  monitored: oxygen       saturation, heart rate, blood pressure, respiratory rate, EKG, adequacy       of pulmonary ventilation, and response to care. Recommendation:           - Repeat colonoscopy in 5 years for surveillance.                           - Return to GI office in 3 months. See EGD report Procedure Code(s):        --- Professional ---                           [id number], Colonoscopy, flexible; diagnostic, including                            collection of specimen(s) by brushing or washing,                            when performed (separate procedure) Diagnosis Code(s):        --- Professional ---                           Z86.010, Personal history of colonic polyps                           K64.0, First degree hemorrhoids                           K57.30, Diverticulosis of large intestine without                            perforation or abscess without bleeding CPT copyright 2022 American [employer] Association. All rights reserved. The codes documented in this report are preliminary and upon coder review may  be revised to meet  current compliance requirements. Gerrit Friends. Imogean Ciampa, MD Gennette Pac, MD 02/04/2023 11:48:11 AM This report has been signed electronically. Number of Addenda: 0

## 2023-02-04 NOTE — H&P (Signed)
@LOGO @   Primary Care Physician:  Sheela Stack Primary Gastroenterologist:  Dr. Jena Gauss  Pre-Procedure History & Physical: HPI:  [name] is a 62 y.o. male here for  screening EGD.  History of cirrhosis.  Also history of polyps removed 2015; overdue for surveillance.  Past Medical History:  Diagnosis Date   Alcoholism (HCC)    Anxiety    Arthritis    Complication of anesthesia    Elevated Blood pressure (critical) during leg surgery   Depression    Diabetes (HCC)    Gallstones    GERD (gastroesophageal reflux disease)    Hemochromatosis    History of kidney stones    Hypertension    Obesity     Past Surgical History:  Procedure Laterality Date   ANKLE SURGERY     left   KNEE SURGERY     right   LEG SURGERY     bilateral    Prior to Admission medications   Medication Sig Start Date End Date Taking? Authorizing Provider  ALPRAZolam Prudy Feeler) 1 MG tablet Take 1-2 mg by mouth See admin instructions. Take 1 tablet (1 mg) by mouth in the morning & take 2 tablets (2 mg) by mouth at bedtime. 10/02/21  Yes [provider]  docusate sodium (COLACE) 100 MG capsule Take 200 mg by mouth at bedtime.   Yes [provider]  ibuprofen (ADVIL) 800 MG tablet Take 800 mg by mouth 2 (two) times daily as needed (pain.). 09/13/21  Yes [provider]  lactulose (CHRONULAC) 10 GM/15ML solution Take 15 mLs (10 g total) by mouth daily. Titrate to goal of 3 BMs daily. Patient not taking: Reported on 01/27/2023 01/14/23   Letta Median, PA-C  lisinopril (PRINIVIL,ZESTRIL) 5 MG tablet Take 5 mg by mouth in the morning. 07/22/17 01/27/27 Yes [provider]  omeprazole (PRILOSEC) 20 MG capsule Take 20 mg by mouth daily before breakfast. 07/22/17 01/27/27 Yes [provider]  polyethylene glycol-electrolytes (NULYTELY) 420 g solution Take 4,000 mLs by mouth once. 12/24/22  Yes [provider]    Allergies as of 12/24/2022 - Review  Complete 12/18/2022  Allergen Reaction Noted   Clindamycin/lincomycin Rash 02/21/2013   Penicillins Nausea And Vomiting and Other (See Comments) 02/21/2013    Family History  Problem Relation Age of Onset   Liver cancer Mother    Cancer Unknown        mult uncles   Cancer Sister    Colon cancer Neg Hx    Stomach cancer Neg Hx     Social History   Socioeconomic History   Marital status: Married    Spouse name: Not on file   Number of children: Not on file   Years of education: Not on file   Highest education level: Not on file  Occupational History   Not on file  Tobacco Use   Smoking status: Former    Packs/day: 2.00    Years: 20.00    Additional pack years: 0.00    Total pack years: 40.00    Types: Cigarettes    Quit date: 12/2021    Years since quitting: 1.1   Smokeless tobacco: Never  Substance and Sexual Activity   Alcohol use: Yes    Comment: 7-8 beer per day   Drug use: No   Sexual activity: Not on file  Other Topics Concern   Not on file  Social History Narrative   Not on file   Social Determinants of  Health   Financial Resource Strain: Not on file  Food Insecurity: Not on file  Transportation Needs: Not on file  Physical Activity: Not on file  Stress: Not on file  Social Connections: Not on file  Intimate Partner Violence: Not on file    Review of Systems: See HPI, otherwise negative ROS  Physical Exam: BP (!) 143/90   Pulse 81   Temp 98 F (36.7 C) (Oral)   Resp 18   Ht 5\' 9"  (1.753 m)   Wt 109.1 kg   SpO2 98%   BMI 35.52 kg/m  General:   Alert,  Well-developed, well-nourished, pleasant and cooperative in NAD Neck:  Supple; no masses or thyromegaly. No significant cervical adenopathy. Lungs:  Clear throughout to auscultation.   No wheezes, crackles, or rhonchi. No acute distress. Heart:  Regular rate and rhythm; no murmurs, clicks, rubs,  or gallops. Abdomen: Non-distended, normal bowel sounds.  Soft and nontender without appreciable  mass or hepatosplenomegaly.   Impression/Plan:    62 year old gentleman with cirrhosis here for screening EGD.  No history of varices.  Overdue for surveillance colonoscopy history of serrated/adenomas removed 2015.  I will offer the patient both an EGD and a colonoscopy today. The risks, benefits, limitations, imponderables and alternatives regarding both EGD and colonoscopy have been reviewed with the patient. Questions have been answered. All parties agreeable.       Notice: This dictation was prepared with Dragon dictation along with smaller phrase technology. Any transcriptional errors that result from this process are unintentional and may not be corrected upon review.

## 2023-02-04 NOTE — Anesthesia Postprocedure Evaluation (Signed)
Anesthesia Post Note  Patient: [name]  Procedure(s) Performed: COLONOSCOPY WITH PROPOFOL ESOPHAGOGASTRODUODENOSCOPY (EGD) WITH PROPOFOL  Patient location during evaluation: Short Stay Anesthesia Type: General Level of consciousness: awake and alert Pain management: pain level controlled Vital Signs Assessment: post-procedure vital signs reviewed and stable Respiratory status: spontaneous breathing Cardiovascular status: blood pressure returned to baseline and stable Postop Assessment: no apparent nausea or vomiting Anesthetic complications: no   No notable events documented.   Last Vitals:  Vitals:   02/04/23 0852  BP: (!) 143/90  Pulse: 81  Resp: 18  Temp: 36.7 C  SpO2: 98%    Last Pain:  Vitals:   02/04/23 0852  TempSrc: Oral  PainSc: 0-No pain                 Zipporah Finamore

## 2023-02-04 NOTE — Addendum Note (Signed)
Addendum  created 02/04/23 1438 by Moshe Salisbury, CRNA   Intraprocedure Staff edited

## 2023-02-04 NOTE — Discharge Instructions (Addendum)
EGD Discharge instructions Please read the instructions outlined below and refer to this sheet in the next few weeks. These discharge instructions provide you with general information on caring for yourself after you leave the hospital. Your doctor may also give you specific instructions. While your treatment has been planned according to the most current medical practices available, unavoidable complications occasionally occur. If you have any problems or questions after discharge, please call your doctor. ACTIVITY You may resume your regular activity but move at a slower pace for the next 24 hours.  Take frequent rest periods for the next 24 hours.  Walking will help expel (get rid of) the air and reduce the bloated feeling in your abdomen.  No driving for 24 hours (because of the anesthesia (medicine) used during the test).  You may shower.  Do not sign any important legal documents or operate any machinery for 24 hours (because of the anesthesia used during the test).  NUTRITION Drink plenty of fluids.  You may resume your normal diet.  Begin with a light meal and progress to your normal diet.  Avoid alcoholic beverages for 24 hours or as instructed by your caregiver.  MEDICATIONS You may resume your normal medications unless your caregiver tells you otherwise.  WHAT YOU CAN EXPECT TODAY You may experience abdominal discomfort such as a feeling of fullness or "gas" pains.  FOLLOW-UP Your doctor will discuss the results of your test with you.  SEEK IMMEDIATE MEDICAL ATTENTION IF ANY OF THE FOLLOWING OCCUR: Excessive nausea (feeling sick to your stomach) and/or vomiting.  Severe abdominal pain and distention (swelling).  Trouble swallowing.  Temperature over 101 F (37.8 C).  Rectal bleeding or vomiting of blood.    Colonoscopy Discharge Instructions  Read the instructions outlined below and refer to this sheet in the next few weeks. These discharge instructions provide you with  general information on caring for yourself after you leave the hospital. Your doctor may also give you specific instructions. While your treatment has been planned according to the most current medical practices available, unavoidable complications occasionally occur. If you have any problems or questions after discharge, call Dr. Gala Romney at [phone]. ACTIVITY You may resume your regular activity, but move at a slower pace for the next 24 hours.  Take frequent rest periods for the next 24 hours.  Walking will help get rid of the air and reduce the bloated feeling in your belly (abdomen).  No driving for 24 hours (because of the medicine (anesthesia) used during the test).   Do not sign any important legal documents or operate any machinery for 24 hours (because of the anesthesia used during the test).  NUTRITION Drink plenty of fluids.  You may resume your normal diet as instructed by your doctor.  Begin with a light meal and progress to your normal diet. Heavy or fried foods are harder to digest and may make you feel sick to your stomach (nauseated).  Avoid alcoholic beverages for 24 hours or as instructed.  MEDICATIONS You may resume your normal medications unless your doctor tells you otherwise.  WHAT YOU CAN EXPECT TODAY Some feelings of bloating in the abdomen.  Passage of more gas than usual.  Spotting of blood in your stool or on the toilet paper.  IF YOU HAD POLYPS REMOVED DURING THE COLONOSCOPY: No aspirin products for 7 days or as instructed.  No alcohol for 7 days or as instructed.  Eat a soft diet for the next 24 hours.  FINDING  OUT THE RESULTS OF YOUR TEST Not all test results are available during your visit. If your test results are not back during the visit, make an appointment with your caregiver to find out the results. Do not assume everything is normal if you have not heard from your caregiver or the medical facility. It is important for you to follow up on all of your test  results.  SEEK IMMEDIATE MEDICAL ATTENTION IF: You have more than a spotting of blood in your stool.  Your belly is swollen (abdominal distention).  You are nauseated or vomiting.  You have a temperature over 101.  You have abdominal pain or discomfort that is severe or gets worse throughout the day.      no esophageal varices found today which is good news  Recommend a repeat EGD in 2 years   no polyps found today.    Recommend repeat colonoscopy in 5 years   office visit with [name] in 3 months   at patient request, I called [name] [phone] -  rolled to voicemail.  Left a message.

## 2023-02-04 NOTE — Transfer of Care (Signed)
Immediate Anesthesia Transfer of Care Note  Patient: [name]  Procedure(s) Performed: COLONOSCOPY WITH PROPOFOL ESOPHAGOGASTRODUODENOSCOPY (EGD) WITH PROPOFOL  Patient Location: Short Stay  Anesthesia Type:General  Level of Consciousness: awake  Airway & Oxygen Therapy: Patient Spontanous Breathing  Post-op Assessment: Report given to RN  Post vital signs: Reviewed and stable  Last Vitals:  Vitals Value Taken Time  BP    Temp    Pulse    Resp    SpO2      Last Pain:  Vitals:   02/04/23 0852  TempSrc: Oral  PainSc: 0-No pain         Complications: No notable events documented.

## 2023-02-11 ENCOUNTER — Encounter (HOSPITAL_COMMUNITY): Payer: Self-pay | Admitting: Internal Medicine

## 2023-02-23 DIAGNOSIS — Z13220 Encounter for screening for lipoid disorders: Secondary | ICD-10-CM | POA: Diagnosis not present

## 2023-02-23 DIAGNOSIS — R739 Hyperglycemia, unspecified: Secondary | ICD-10-CM | POA: Diagnosis not present

## 2023-02-23 DIAGNOSIS — R945 Abnormal results of liver function studies: Secondary | ICD-10-CM | POA: Diagnosis not present

## 2023-02-23 DIAGNOSIS — I10 Essential (primary) hypertension: Secondary | ICD-10-CM | POA: Diagnosis not present

## 2023-03-02 DIAGNOSIS — E1165 Type 2 diabetes mellitus with hyperglycemia: Secondary | ICD-10-CM | POA: Diagnosis not present

## 2023-03-02 DIAGNOSIS — Z13220 Encounter for screening for lipoid disorders: Secondary | ICD-10-CM | POA: Diagnosis not present

## 2023-03-02 DIAGNOSIS — L438 Other lichen planus: Secondary | ICD-10-CM | POA: Diagnosis not present

## 2023-03-02 DIAGNOSIS — K8020 Calculus of gallbladder without cholecystitis without obstruction: Secondary | ICD-10-CM | POA: Diagnosis not present

## 2023-03-02 DIAGNOSIS — Z8781 Personal history of (healed) traumatic fracture: Secondary | ICD-10-CM | POA: Diagnosis not present

## 2023-03-02 DIAGNOSIS — R911 Solitary pulmonary nodule: Secondary | ICD-10-CM | POA: Diagnosis not present

## 2023-03-02 DIAGNOSIS — I700 Atherosclerosis of aorta: Secondary | ICD-10-CM | POA: Diagnosis not present

## 2023-03-02 DIAGNOSIS — F419 Anxiety disorder, unspecified: Secondary | ICD-10-CM | POA: Diagnosis not present

## 2023-03-02 DIAGNOSIS — R739 Hyperglycemia, unspecified: Secondary | ICD-10-CM | POA: Diagnosis not present

## 2023-03-02 DIAGNOSIS — D751 Secondary polycythemia: Secondary | ICD-10-CM | POA: Diagnosis not present

## 2023-03-02 DIAGNOSIS — R945 Abnormal results of liver function studies: Secondary | ICD-10-CM | POA: Diagnosis not present

## 2023-03-02 DIAGNOSIS — I10 Essential (primary) hypertension: Secondary | ICD-10-CM | POA: Diagnosis not present

## 2023-03-19 ENCOUNTER — Encounter: Payer: Self-pay | Admitting: Gastroenterology

## 2023-03-23 ENCOUNTER — Inpatient Hospital Stay: Payer: PPO

## 2023-03-23 ENCOUNTER — Inpatient Hospital Stay: Payer: PPO | Attending: Hematology

## 2023-03-23 ENCOUNTER — Encounter: Payer: Self-pay | Admitting: Physician Assistant

## 2023-03-23 DIAGNOSIS — D751 Secondary polycythemia: Secondary | ICD-10-CM

## 2023-03-23 DIAGNOSIS — R7989 Other specified abnormal findings of blood chemistry: Secondary | ICD-10-CM | POA: Diagnosis not present

## 2023-03-23 LAB — CBC WITH DIFFERENTIAL/PLATELET
Abs Immature Granulocytes: 0.01 10*3/uL (ref 0.00–0.07)
Basophils Absolute: 0.1 10*3/uL (ref 0.0–0.1)
Basophils Relative: 1 %
Eosinophils Absolute: 0.2 10*3/uL (ref 0.0–0.5)
Eosinophils Relative: 3 %
HCT: 45.2 % (ref 39.0–52.0)
Hemoglobin: 16.1 g/dL (ref 13.0–17.0)
Immature Granulocytes: 0 %
Lymphocytes Relative: 28 %
Lymphs Abs: 1.4 10*3/uL (ref 0.7–4.0)
MCH: 32.9 pg (ref 26.0–34.0)
MCHC: 35.6 g/dL (ref 30.0–36.0)
MCV: 92.4 fL (ref 80.0–100.0)
Monocytes Absolute: 0.5 10*3/uL (ref 0.1–1.0)
Monocytes Relative: 9 %
Neutro Abs: 3 10*3/uL (ref 1.7–7.7)
Neutrophils Relative %: 59 %
Platelets: 148 10*3/uL — ABNORMAL LOW (ref 150–400)
RBC: 4.89 MIL/uL (ref 4.22–5.81)
RDW: 13.3 % (ref 11.5–15.5)
WBC: 5.2 10*3/uL (ref 4.0–10.5)
nRBC: 0 % (ref 0.0–0.2)

## 2023-03-23 NOTE — Progress Notes (Signed)
Hematocrit today is 45.2.  Hold phlebotomy per orders by Rojelio Brenner, PA-C.  Patient left ambulatory in stable condition.

## 2023-04-14 ENCOUNTER — Inpatient Hospital Stay: Payer: PPO | Attending: Hematology

## 2023-04-14 ENCOUNTER — Telehealth: Payer: Self-pay | Admitting: *Deleted

## 2023-04-14 DIAGNOSIS — D751 Secondary polycythemia: Secondary | ICD-10-CM | POA: Diagnosis not present

## 2023-04-14 DIAGNOSIS — R7989 Other specified abnormal findings of blood chemistry: Secondary | ICD-10-CM | POA: Insufficient documentation

## 2023-04-14 LAB — CBC WITH DIFFERENTIAL/PLATELET
Abs Immature Granulocytes: 0 10*3/uL (ref 0.00–0.07)
Basophils Absolute: 0 10*3/uL (ref 0.0–0.1)
Basophils Relative: 1 %
Eosinophils Absolute: 0.3 10*3/uL (ref 0.0–0.5)
Eosinophils Relative: 6 %
HCT: 48.6 % (ref 39.0–52.0)
Hemoglobin: 17.4 g/dL — ABNORMAL HIGH (ref 13.0–17.0)
Immature Granulocytes: 0 %
Lymphocytes Relative: 33 %
Lymphs Abs: 1.5 10*3/uL (ref 0.7–4.0)
MCH: 33.2 pg (ref 26.0–34.0)
MCHC: 35.8 g/dL (ref 30.0–36.0)
MCV: 92.7 fL (ref 80.0–100.0)
Monocytes Absolute: 0.4 10*3/uL (ref 0.1–1.0)
Monocytes Relative: 10 %
Neutro Abs: 2.3 10*3/uL (ref 1.7–7.7)
Neutrophils Relative %: 50 %
Platelets: 152 10*3/uL (ref 150–400)
RBC: 5.24 MIL/uL (ref 4.22–5.81)
RDW: 13.2 % (ref 11.5–15.5)
WBC: 4.4 10*3/uL (ref 4.0–10.5)
nRBC: 0 % (ref 0.0–0.2)

## 2023-04-14 NOTE — Telephone Encounter (Signed)
Labs reviewed by Rojelio Brenner, PAC and patient does not need phlebotomy at this time.  Recommended follow up with PCP or Urgent Care to assess symptoms.  Patient verbalized understanding.

## 2023-04-14 NOTE — Telephone Encounter (Signed)
Wife called to advise that patient is experiencing weakness and generalized malaise.  Receives phlebotomy periodically.  Will bring in today for CBC to assess and treat if needed.  [name] - PAC made aware.

## 2023-04-15 ENCOUNTER — Encounter: Payer: Self-pay | Admitting: Physician Assistant

## 2023-04-15 NOTE — Telephone Encounter (Signed)
Please advise.  He was advised to go to PCP or Urgent Care yesterday.

## 2023-05-29 DIAGNOSIS — E1165 Type 2 diabetes mellitus with hyperglycemia: Secondary | ICD-10-CM | POA: Diagnosis not present

## 2023-05-29 DIAGNOSIS — R5383 Other fatigue: Secondary | ICD-10-CM | POA: Diagnosis not present

## 2023-05-29 DIAGNOSIS — Z13220 Encounter for screening for lipoid disorders: Secondary | ICD-10-CM | POA: Diagnosis not present

## 2023-05-29 DIAGNOSIS — D751 Secondary polycythemia: Secondary | ICD-10-CM | POA: Diagnosis not present

## 2023-05-29 DIAGNOSIS — K219 Gastro-esophageal reflux disease without esophagitis: Secondary | ICD-10-CM | POA: Diagnosis not present

## 2023-06-02 DIAGNOSIS — D751 Secondary polycythemia: Secondary | ICD-10-CM | POA: Diagnosis not present

## 2023-06-02 DIAGNOSIS — K8020 Calculus of gallbladder without cholecystitis without obstruction: Secondary | ICD-10-CM | POA: Diagnosis not present

## 2023-06-02 DIAGNOSIS — R945 Abnormal results of liver function studies: Secondary | ICD-10-CM | POA: Diagnosis not present

## 2023-06-02 DIAGNOSIS — R739 Hyperglycemia, unspecified: Secondary | ICD-10-CM | POA: Diagnosis not present

## 2023-06-02 DIAGNOSIS — R911 Solitary pulmonary nodule: Secondary | ICD-10-CM | POA: Diagnosis not present

## 2023-06-02 DIAGNOSIS — F419 Anxiety disorder, unspecified: Secondary | ICD-10-CM | POA: Diagnosis not present

## 2023-06-02 DIAGNOSIS — K219 Gastro-esophageal reflux disease without esophagitis: Secondary | ICD-10-CM | POA: Diagnosis not present

## 2023-06-02 DIAGNOSIS — L438 Other lichen planus: Secondary | ICD-10-CM | POA: Diagnosis not present

## 2023-06-02 DIAGNOSIS — E1165 Type 2 diabetes mellitus with hyperglycemia: Secondary | ICD-10-CM | POA: Diagnosis not present

## 2023-06-02 DIAGNOSIS — Z13220 Encounter for screening for lipoid disorders: Secondary | ICD-10-CM | POA: Diagnosis not present

## 2023-06-02 DIAGNOSIS — I700 Atherosclerosis of aorta: Secondary | ICD-10-CM | POA: Diagnosis not present

## 2023-06-02 DIAGNOSIS — I10 Essential (primary) hypertension: Secondary | ICD-10-CM | POA: Diagnosis not present

## 2023-06-17 ENCOUNTER — Other Ambulatory Visit: Payer: Self-pay

## 2023-06-17 DIAGNOSIS — D751 Secondary polycythemia: Secondary | ICD-10-CM

## 2023-06-18 ENCOUNTER — Inpatient Hospital Stay: Payer: PPO

## 2023-06-19 ENCOUNTER — Inpatient Hospital Stay: Payer: PPO | Attending: Hematology

## 2023-06-19 ENCOUNTER — Inpatient Hospital Stay: Payer: PPO

## 2023-06-19 DIAGNOSIS — Z87891 Personal history of nicotine dependence: Secondary | ICD-10-CM | POA: Insufficient documentation

## 2023-06-19 DIAGNOSIS — Z86711 Personal history of pulmonary embolism: Secondary | ICD-10-CM | POA: Diagnosis not present

## 2023-06-19 DIAGNOSIS — R918 Other nonspecific abnormal finding of lung field: Secondary | ICD-10-CM | POA: Insufficient documentation

## 2023-06-19 DIAGNOSIS — K769 Liver disease, unspecified: Secondary | ICD-10-CM | POA: Insufficient documentation

## 2023-06-19 DIAGNOSIS — D751 Secondary polycythemia: Secondary | ICD-10-CM | POA: Diagnosis not present

## 2023-06-19 DIAGNOSIS — I7300 Raynaud's syndrome without gangrene: Secondary | ICD-10-CM | POA: Diagnosis not present

## 2023-06-19 DIAGNOSIS — D696 Thrombocytopenia, unspecified: Secondary | ICD-10-CM | POA: Diagnosis not present

## 2023-06-19 DIAGNOSIS — Z800 Family history of malignant neoplasm of digestive organs: Secondary | ICD-10-CM | POA: Insufficient documentation

## 2023-06-19 DIAGNOSIS — R519 Headache, unspecified: Secondary | ICD-10-CM | POA: Insufficient documentation

## 2023-06-19 DIAGNOSIS — R7989 Other specified abnormal findings of blood chemistry: Secondary | ICD-10-CM | POA: Insufficient documentation

## 2023-06-19 LAB — CBC WITH DIFFERENTIAL/PLATELET
Abs Immature Granulocytes: 0.01 10*3/uL (ref 0.00–0.07)
Basophils Absolute: 0 10*3/uL (ref 0.0–0.1)
Basophils Relative: 1 %
Eosinophils Absolute: 0.1 10*3/uL (ref 0.0–0.5)
Eosinophils Relative: 2 %
HCT: 52.4 % — ABNORMAL HIGH (ref 39.0–52.0)
Hemoglobin: 18.3 g/dL — ABNORMAL HIGH (ref 13.0–17.0)
Immature Granulocytes: 0 %
Lymphocytes Relative: 33 %
Lymphs Abs: 1.6 10*3/uL (ref 0.7–4.0)
MCH: 32.7 pg (ref 26.0–34.0)
MCHC: 34.9 g/dL (ref 30.0–36.0)
MCV: 93.6 fL (ref 80.0–100.0)
Monocytes Absolute: 0.4 10*3/uL (ref 0.1–1.0)
Monocytes Relative: 7 %
Neutro Abs: 2.8 10*3/uL (ref 1.7–7.7)
Neutrophils Relative %: 57 %
Platelets: 138 10*3/uL — ABNORMAL LOW (ref 150–400)
RBC: 5.6 MIL/uL (ref 4.22–5.81)
RDW: 12 % (ref 11.5–15.5)
WBC: 5 10*3/uL (ref 4.0–10.5)
nRBC: 0 % (ref 0.0–0.2)

## 2023-06-19 NOTE — Progress Notes (Signed)
Patient presents today for phlebotomy per MD orders. Phlebotomy procedure started at 1335 and ended at 1350. 500 cc removed. Patient tolerated procedure well. Procedure tolerated well and without incident.  Discharged ambulatory in stable condition.

## 2023-06-19 NOTE — Patient Instructions (Signed)
[facility]  Discharge Instructions: Thank you for choosing [facility] to provide your oncology and hematology care.  If you have a lab appointment with the Cancer Center - please note that after April 8th, 2024, all labs will be drawn in the cancer center.  You do not have to check in or register with the main entrance as you have in the past but will complete your check-in in the cancer center.  Wear comfortable clothing and clothing appropriate for easy access to any Portacath or PICC line.   We strive to give you quality time with your provider. You may need to reschedule your appointment if you arrive late (15 or more minutes).  Arriving late affects you and other patients whose appointments are after yours.  Also, if you miss three or more appointments without notifying the office, you may be dismissed from the clinic at the provider's discretion.      For prescription refill requests, have your pharmacy contact our office and allow 72 hours for refills to be completed.    Today you received the following chemotherapy and/or immunotherapy agents Phlebotomy      To help prevent nausea and vomiting after your treatment, we encourage you to take your nausea medication as directed.  BELOW ARE SYMPTOMS THAT SHOULD BE REPORTED IMMEDIATELY: *FEVER GREATER THAN 100.4 F (38 C) OR HIGHER *CHILLS OR SWEATING *NAUSEA AND VOMITING THAT IS NOT CONTROLLED WITH YOUR NAUSEA MEDICATION *UNUSUAL SHORTNESS OF BREATH *UNUSUAL BRUISING OR BLEEDING *URINARY PROBLEMS (pain or burning when urinating, or frequent urination) *BOWEL PROBLEMS (unusual diarrhea, constipation, pain near the anus) TENDERNESS IN MOUTH AND THROAT WITH OR WITHOUT PRESENCE OF ULCERS (sore throat, sores in mouth, or a toothache) UNUSUAL RASH, SWELLING OR PAIN  UNUSUAL VAGINAL DISCHARGE OR ITCHING   Items with * indicate a potential emergency and should be followed up as soon as possible or go to the  Emergency Department if any problems should occur.  Please show the CHEMOTHERAPY ALERT CARD or IMMUNOTHERAPY ALERT CARD at check-in to the Emergency Department and triage nurse.  Should you have questions after your visit or need to cancel or reschedule your appointment, please contact [facility] [phone]  and follow the prompts.  Office hours are 8:00 a.m. to 4:30 p.m. Monday - Friday. Please note that voicemails left after 4:00 p.m. may not be returned until the following business day.  We are closed weekends and major holidays. You have access to a nurse at all times for urgent questions. Please call the main number to the clinic [phone] and follow the prompts.  For any non-urgent questions, you may also contact your provider using MyChart. We now offer e-Visits for anyone 18 and older to request care online for non-urgent symptoms. For details visit [email or web address].   Also download the MyChart app! Go to the app store, search "MyChart", open the app, select [facility], and log in with your MyChart username and password.   
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
## 2023-06-24 ENCOUNTER — Inpatient Hospital Stay: Payer: PPO | Admitting: Physician Assistant

## 2023-06-24 VITALS — BP 147/92 | HR 61 | Temp 98.4°F | Resp 16 | Wt 241.2 lb

## 2023-06-24 DIAGNOSIS — D751 Secondary polycythemia: Secondary | ICD-10-CM | POA: Diagnosis not present

## 2023-06-24 DIAGNOSIS — R7989 Other specified abnormal findings of blood chemistry: Secondary | ICD-10-CM

## 2023-06-24 MED ORDER — ASPIRIN 81 MG PO TBEC
81.0000 mg | DELAYED_RELEASE_TABLET | Freq: Every day | ORAL | 3 refills | Status: DC
Start: 2023-06-24 — End: 2024-05-31

## 2023-06-24 NOTE — Patient Instructions (Addendum)
[facility] at [facility] **VISIT SUMMARY & IMPORTANT INSTRUCTIONS **   You were seen today by Rojelio Brenner PA-C for your elevated hemoglobin/red blood cells.    Your elevated hemoglobin and red blood cells are thought to be related to smoking-related lung damage (possible COPD). It is also possible that you may have some underlying sleep apnea that is causing elevated blood counts.  We have referred you to sleep medicine specialist for further testing. Having hemoglobin too high places you at increased risk of blood clot, heart attack, and stroke. Continue taking aspirin 81 mg ("baby aspirin") daily to help prevent risk of blood clots. We will recheck your blood and perform a possible phlebotomy every 3 months. We will see you for labs, office visit, and possible phlebotomy in 1 year.  ** Thank you for trusting me with your healthcare!  I strive to provide all of my patients with quality care at each visit.  If you receive a survey for this visit, I would be so grateful to you for taking the time to provide feedback.  Thank you in advance!  ~ Polo Mcmartin                   Dr. Doreatha Massed   &   Rojelio Brenner, PA-C   - - - - - - - - - - - - - - - - - -    Thank you for choosing [facility] at [facility] to provide your oncology and hematology care.  To afford each patient quality time with our provider, please arrive at least 15 minutes before your scheduled appointment time.   If you have a lab appointment with the Cancer Center please come in thru the Main Entrance and check in at the main information desk.  You need to re-schedule your appointment should you arrive 10 or more minutes late.  We strive to give you quality time with our providers, and arriving late affects you and other patients whose appointments are after yours.  Also, if you no show three or more times for appointments you may be dismissed from the clinic at the  providers discretion.     Again, thank you for choosing [facility].  Our hope is that these requests will decrease the amount of time that you wait before being seen by our physicians.       _____________________________________________________________  Should you have questions after your visit to [facility], please contact our office at [phone] and follow the prompts.  Our office hours are 8:00 a.m. and 4:30 p.m. Monday - Friday.  Please note that voicemails left after 4:00 p.m. may not be returned until the following business day.  We are closed weekends and major holidays.  You do have access to a nurse 24-7, just call the main number to the clinic [phone] and do not press any options, hold on the line and a nurse will answer the phone.    For prescription refill requests, have your pharmacy contact our office and allow 72 hours.

## 2023-06-26 ENCOUNTER — Inpatient Hospital Stay: Payer: PPO | Admitting: Oncology

## 2023-07-02 ENCOUNTER — Encounter: Payer: Self-pay | Admitting: Hematology

## 2023-08-27 DIAGNOSIS — I10 Essential (primary) hypertension: Secondary | ICD-10-CM | POA: Diagnosis not present

## 2023-08-27 DIAGNOSIS — Z13220 Encounter for screening for lipoid disorders: Secondary | ICD-10-CM | POA: Diagnosis not present

## 2023-08-27 DIAGNOSIS — R945 Abnormal results of liver function studies: Secondary | ICD-10-CM | POA: Diagnosis not present

## 2023-09-01 DIAGNOSIS — L438 Other lichen planus: Secondary | ICD-10-CM | POA: Diagnosis not present

## 2023-09-01 DIAGNOSIS — E1165 Type 2 diabetes mellitus with hyperglycemia: Secondary | ICD-10-CM | POA: Diagnosis not present

## 2023-09-01 DIAGNOSIS — M47816 Spondylosis without myelopathy or radiculopathy, lumbar region: Secondary | ICD-10-CM | POA: Diagnosis not present

## 2023-09-01 DIAGNOSIS — D751 Secondary polycythemia: Secondary | ICD-10-CM | POA: Diagnosis not present

## 2023-09-01 DIAGNOSIS — R109 Unspecified abdominal pain: Secondary | ICD-10-CM | POA: Diagnosis not present

## 2023-09-01 DIAGNOSIS — M51369 Other intervertebral disc degeneration, lumbar region without mention of lumbar back pain or lower extremity pain: Secondary | ICD-10-CM | POA: Diagnosis not present

## 2023-09-01 DIAGNOSIS — K8020 Calculus of gallbladder without cholecystitis without obstruction: Secondary | ICD-10-CM | POA: Diagnosis not present

## 2023-09-01 DIAGNOSIS — N200 Calculus of kidney: Secondary | ICD-10-CM | POA: Diagnosis not present

## 2023-09-01 DIAGNOSIS — R911 Solitary pulmonary nodule: Secondary | ICD-10-CM | POA: Diagnosis not present

## 2023-09-01 DIAGNOSIS — I10 Essential (primary) hypertension: Secondary | ICD-10-CM | POA: Diagnosis not present

## 2023-09-01 DIAGNOSIS — Z87442 Personal history of urinary calculi: Secondary | ICD-10-CM | POA: Diagnosis not present

## 2023-09-01 DIAGNOSIS — R945 Abnormal results of liver function studies: Secondary | ICD-10-CM | POA: Diagnosis not present

## 2023-09-01 DIAGNOSIS — I2510 Atherosclerotic heart disease of native coronary artery without angina pectoris: Secondary | ICD-10-CM | POA: Diagnosis not present

## 2023-09-01 DIAGNOSIS — I700 Atherosclerosis of aorta: Secondary | ICD-10-CM | POA: Diagnosis not present

## 2023-09-01 DIAGNOSIS — K5730 Diverticulosis of large intestine without perforation or abscess without bleeding: Secondary | ICD-10-CM | POA: Diagnosis not present

## 2023-09-01 DIAGNOSIS — Z8781 Personal history of (healed) traumatic fracture: Secondary | ICD-10-CM | POA: Diagnosis not present

## 2023-09-01 DIAGNOSIS — M545 Low back pain, unspecified: Secondary | ICD-10-CM | POA: Diagnosis not present

## 2023-09-01 DIAGNOSIS — M542 Cervicalgia: Secondary | ICD-10-CM | POA: Diagnosis not present

## 2023-09-17 ENCOUNTER — Encounter: Payer: Self-pay | Admitting: *Deleted

## 2023-09-22 ENCOUNTER — Ambulatory Visit (INDEPENDENT_AMBULATORY_CARE_PROVIDER_SITE_OTHER): Payer: PPO | Admitting: Internal Medicine

## 2023-09-22 ENCOUNTER — Encounter: Payer: Self-pay | Admitting: Internal Medicine

## 2023-09-22 ENCOUNTER — Telehealth: Payer: Self-pay

## 2023-09-22 VITALS — BP 139/80 | HR 73 | Temp 97.9°F | Ht 69.0 in | Wt 244.8 lb

## 2023-09-22 DIAGNOSIS — K7460 Unspecified cirrhosis of liver: Secondary | ICD-10-CM

## 2023-09-22 DIAGNOSIS — F1010 Alcohol abuse, uncomplicated: Secondary | ICD-10-CM | POA: Diagnosis not present

## 2023-09-22 DIAGNOSIS — D751 Secondary polycythemia: Secondary | ICD-10-CM

## 2023-09-22 DIAGNOSIS — Z860100 Personal history of colon polyps, unspecified: Secondary | ICD-10-CM

## 2023-09-22 NOTE — Patient Instructions (Signed)
It was good to see you again today!  As discussed, your liver has been permanently damaged largely due to alcohol use.  I strongly encourage you completely refrain from drinking all forms of alcohol to limit further damage.  For a good heart healthy lifestyle/diet.  High-quality diet and regular exercise is recommended  Ultrasound of your liver in 6 months  Office visit with Tana Coast just after ultrasound Plan for repeat colonoscopy in about 4 years from now (history of polyps).  Repeat upper endoscopy in about a year and a half to recheck for esophageal varices.

## 2023-09-22 NOTE — Telephone Encounter (Signed)
-----   Message from [name] sent at 09/22/2023  4:32 PM EST ----- At your convenience this week, can you make contact with the patient and ask him if he got hepatitis A and B vaccine.  And document in chart as I did not ask him today.

## 2023-09-22 NOTE — Telephone Encounter (Signed)
Lmom for pt to return call.

## 2023-09-22 NOTE — Progress Notes (Unsigned)
Primary Care Physician:  Sheela Stack Primary Gastroenterologist:  Dr. Jena Gauss  Pre-Procedure History & Physical: HPI:  [name] is a 63 y.o. male here for follow-up recently diagnosed cirrhosis likely secondary to mass old/lifelong alcohol use (beer).  Lobular liver without portal on recent [place] CT and prior ultrasound.  EGD last year demonstrated mild portal gastropathy and no esophageal varices.  He should have a repeat EGD in about a year and a half from now also surveillance colonoscopy in about 4 years given history of colonic adenoma in the past.  Patient has had extensive evaluation for other causes of liver disease.  IgM minimally elevated but it is felt that his liver disease is secondary to mass old/EtOH.  He continues to drink.  He does consume large amounts of alcohol over the years (4 DWIs).  He has erythrocytosis and elevated ferritin and HFE hemochromatosis markers positive for heterozygous for H63D mutation.  He gets regular phlebotomies through the fourth floor at [facility].  No history of ascites or decompensation.  He has GERD well-controlled on omeprazole 20 mg daily.  He is susceptible to both hepatitis A and B based on serologies.  Past Medical History:  Diagnosis Date   Alcoholism (HCC)    Anxiety    Arthritis    Complication of anesthesia    Elevated Blood pressure (critical) during leg surgery   Depression    Diabetes (HCC)    Gallstones    GERD (gastroesophageal reflux disease)    Hemochromatosis    History of kidney stones    Hypertension    Obesity     Past Surgical History:  Procedure Laterality Date   ANKLE SURGERY     left   COLONOSCOPY WITH PROPOFOL N/A 02/04/2023   Procedure: COLONOSCOPY WITH PROPOFOL;  Surgeon: Corbin Ade, MD;  [facility];  Service: Endoscopy;  Laterality: N/A;  10:45 am, asa 3   ESOPHAGOGASTRODUODENOSCOPY (EGD) WITH PROPOFOL N/A 02/04/2023   Procedure: ESOPHAGOGASTRODUODENOSCOPY  (EGD) WITH PROPOFOL;  Surgeon: Corbin Ade, MD;  Location: [facility];  Service: Endoscopy;  Laterality: N/A;   KNEE SURGERY     right   LEG SURGERY     bilateral    Prior to Admission medications   Medication Sig Start Date End Date Taking? Authorizing Provider  ALPRAZolam Prudy Feeler) 1 MG tablet Take 1-2 mg by mouth See admin instructions. Take 1 tablet (1 mg) by mouth in the morning & take 2 tablets (2 mg) by mouth at bedtime. 10/02/21  Yes [provider]  aspirin EC (ASPIRIN EC ADULT LOW DOSE) 81 MG tablet Take 1 tablet (81 mg total) by mouth daily. Swallow whole. 06/24/23  Yes Pennington, Rebekah M, PA-C  ibuprofen (ADVIL) 800 MG tablet Take 800 mg by mouth 2 (two) times daily as needed (pain.). 09/13/21  Yes [provider]  lisinopril (PRINIVIL,ZESTRIL) 5 MG tablet Take 5 mg by mouth in the morning. 07/22/17 01/27/27 Yes [provider]  omeprazole (PRILOSEC) 20 MG capsule Take 20 mg by mouth daily before breakfast. 07/22/17 01/27/27 Yes [provider]    Allergies as of 09/22/2023 - Review Complete 09/22/2023  Allergen Reaction Noted   Clindamycin/lincomycin Rash 02/21/2013   Penicillins Nausea And Vomiting and Other (See Comments) 02/21/2013    Family History  Problem Relation Age of Onset   Liver cancer Mother    Cancer Unknown        mult uncles   Cancer Sister  Colon cancer Neg Hx    Stomach cancer Neg Hx     Social History   Socioeconomic History   Marital status: Married    Spouse name: Not on file   Number of children: Not on file   Years of education: Not on file   Highest education level: Not on file  Occupational History   Not on file  Tobacco Use   Smoking status: Former    Current packs/day: 0.00    Average packs/day: 2.0 packs/day for 20.0 years (40.0 ttl pk-yrs)    Types: Cigarettes    Start date: 12/2001    Quit date: 12/2021    Years since quitting: 1.7   Smokeless tobacco: Never  Substance and Sexual  Activity   Alcohol use: Yes    Comment: 7-8 beer per day   Drug use: No   Sexual activity: Not on file  Other Topics Concern   Not on file  Social History Narrative   Not on file   Social Drivers of Health   Financial Resource Strain: Low Risk  (03/18/2021)   Received from [facility], [facility]   Overall Financial Resource Strain (CARDIA)    Difficulty of Paying Living Expenses: Not hard at all  Food Insecurity: No Food Insecurity (03/18/2021)   Received from [facility], [facility]   Hunger Vital Sign    Worried About Running Out of Food in the Last Year: Never true    Ran Out of Food in the Last Year: Never true  Transportation Needs: No Transportation Needs (03/18/2021)   Received from [facility], [facility]   [facility] - Transportation    Lack of Transportation (Medical): No    Lack of Transportation (Non-Medical): No  Physical Activity: Sufficiently Active (03/18/2021)   Received from [facility], [facility]   Exercise Vital Sign    Days of Exercise per Week: 7 days    Minutes of Exercise per Session: 30 min  Stress: No Stress Concern Present (03/18/2021)   Received from [facility], [facility] of [facility]ational [facility] - Occupational Stress Questionnaire    Feeling of Stress : Not at all  Social Connections: Moderately Isolated (03/18/2021)   Received from [facility], [facility] : [facility]   Social Connection and Isolation Panel [NHANES]    Frequency of Communication with Friends and Family: Three times a week    Frequency of Social Gatherings with Friends and Family: Never    Attends Religious Services: Never    Database administrator or Organizations: No    Attends Banker Meetings: Never    Marital Status: Married  Catering manager Violence: Not At Risk (03/18/2021)   Received from [facility], [facility]   Humiliation, Afraid, Rape, and Kick questionnaire    Fear of Current or  Ex-Partner: No    Emotionally Abused: No    Physically Abused: No    Sexually Abused: No    Review of Systems: See HPI, otherwise negative ROS  Physical Exam: BP 139/80 (BP Location: Right Arm, Patient Position: Sitting, Cuff Size: Large)   Pulse 73   Temp 97.9 F (36.6 C) (Oral)   Ht 5\' 9"  (1.753 m)   Wt 244 lb 12.8 oz (111 kg)   SpO2 96%   BMI 36.15 kg/m  General:   Alert,  Well-developed,pleasant and cooperative in NAD Neck:  Supple; no masses or thyromegaly. No  significant cervical adenopathy. Lungs:  Clear throughout to auscultation.   No wheezes, crackles, or rhonchi. No acute distress. Heart:  Regular rate and rhythm; no murmurs, clicks, rubs,  or gallops. Abdomen: Centrally obese.  Positive bowel sounds soft and nontender no obvious organomegaly.  No obvious fluid present.   Pulses:  Normal pulses noted. Extremities: 1+ LE edema impression/Plan:    Impression:58 -year-old gentleman with mass old/cirrhosis longstanding alcohol use disorder returns for follow-up.  He remains compensated.  No esophageal varices as of last year.  He is actively being followed and treated for erythrocytosis elevated ferritin to the hematology clinic.  He is heterozygous for H63 mutation.  In this setting going heavy alcohol use, heterozygote can accumulate iron.  [name] has been elevated but is improving.  He is getting phlebotomies per hematology recommendations.  Had a frank discussion about the importance of complete cessation from all alcohol weight loss and a high-quality diet along with exercise.  It seems patient is not interested in stopping his beer consumption at this time.  His wife was in attendance for conversations.  Recommendations:   As discussed, complete and absolute cessation of alcohol strongly recommended.    Strive for  a good heart healthy lifestyle/diet.  High-quality diet and regular exercise is recommended  Ultrasound of your liver in 6 months  Is daily.  Plan  for repeat colonoscopy in about 4 years from now (history of polyps).  Repeat upper endoscopy in about a year and a half to recheck for esophageal varices.    Notice: This dictation was prepared with Dragon dictation along with smaller phrase technology. Any transcriptional errors that result from this process are unintentional and may not be corrected upon review.

## 2023-09-24 ENCOUNTER — Inpatient Hospital Stay: Payer: PPO | Attending: Hematology

## 2023-09-24 ENCOUNTER — Inpatient Hospital Stay: Payer: PPO

## 2023-09-24 DIAGNOSIS — D751 Secondary polycythemia: Secondary | ICD-10-CM | POA: Diagnosis not present

## 2023-09-24 LAB — CBC
HCT: 47.2 % (ref 39.0–52.0)
Hemoglobin: 16.9 g/dL (ref 13.0–17.0)
MCH: 33.3 pg (ref 26.0–34.0)
MCHC: 35.8 g/dL (ref 30.0–36.0)
MCV: 93.1 fL (ref 80.0–100.0)
Platelets: 164 10*3/uL (ref 150–400)
RBC: 5.07 MIL/uL (ref 4.22–5.81)
RDW: 12.4 % (ref 11.5–15.5)
WBC: 6 10*3/uL (ref 4.0–10.5)
nRBC: 0 % (ref 0.0–0.2)

## 2023-09-24 NOTE — Progress Notes (Signed)
No Phlebotomy needed today based per parameters, patient made aware and discharged in satisfactory condition.

## 2023-09-28 NOTE — Telephone Encounter (Signed)
Pt has not had either vaccines but states that he will look into getting them done.

## 2023-10-03 DIAGNOSIS — H40033 Anatomical narrow angle, bilateral: Secondary | ICD-10-CM | POA: Diagnosis not present

## 2023-10-03 DIAGNOSIS — H2513 Age-related nuclear cataract, bilateral: Secondary | ICD-10-CM | POA: Diagnosis not present

## 2023-11-09 DIAGNOSIS — M545 Low back pain, unspecified: Secondary | ICD-10-CM | POA: Diagnosis not present

## 2023-11-10 ENCOUNTER — Other Ambulatory Visit (HOSPITAL_COMMUNITY): Payer: Self-pay | Admitting: Physician Assistant

## 2023-11-10 DIAGNOSIS — M5410 Radiculopathy, site unspecified: Secondary | ICD-10-CM

## 2023-11-16 ENCOUNTER — Ambulatory Visit (HOSPITAL_COMMUNITY)
Admission: RE | Admit: 2023-11-16 | Discharge: 2023-11-16 | Disposition: A | Discharge status: Home / Self Care | Source: Ambulatory Visit | Attending: Physician Assistant | Admitting: Physician Assistant

## 2023-11-16 DIAGNOSIS — M5410 Radiculopathy, site unspecified: Secondary | ICD-10-CM | POA: Insufficient documentation

## 2023-11-16 DIAGNOSIS — M48061 Spinal stenosis, lumbar region without neurogenic claudication: Secondary | ICD-10-CM | POA: Diagnosis not present

## 2023-11-16 DIAGNOSIS — M47816 Spondylosis without myelopathy or radiculopathy, lumbar region: Secondary | ICD-10-CM | POA: Diagnosis not present

## 2023-11-16 DIAGNOSIS — M5136 Other intervertebral disc degeneration, lumbar region with discogenic back pain only: Secondary | ICD-10-CM | POA: Diagnosis not present

## 2023-11-23 DIAGNOSIS — R5383 Other fatigue: Secondary | ICD-10-CM | POA: Diagnosis not present

## 2023-11-23 DIAGNOSIS — D751 Secondary polycythemia: Secondary | ICD-10-CM | POA: Diagnosis not present

## 2023-11-23 DIAGNOSIS — Z13220 Encounter for screening for lipoid disorders: Secondary | ICD-10-CM | POA: Diagnosis not present

## 2023-11-23 DIAGNOSIS — E1165 Type 2 diabetes mellitus with hyperglycemia: Secondary | ICD-10-CM | POA: Diagnosis not present

## 2023-11-30 DIAGNOSIS — F419 Anxiety disorder, unspecified: Secondary | ICD-10-CM | POA: Diagnosis not present

## 2023-11-30 DIAGNOSIS — E1165 Type 2 diabetes mellitus with hyperglycemia: Secondary | ICD-10-CM | POA: Diagnosis not present

## 2023-11-30 DIAGNOSIS — K219 Gastro-esophageal reflux disease without esophagitis: Secondary | ICD-10-CM | POA: Diagnosis not present

## 2023-11-30 DIAGNOSIS — I10 Essential (primary) hypertension: Secondary | ICD-10-CM | POA: Diagnosis not present

## 2023-11-30 DIAGNOSIS — Z6837 Body mass index (BMI) 37.0-37.9, adult: Secondary | ICD-10-CM | POA: Diagnosis not present

## 2023-12-23 ENCOUNTER — Inpatient Hospital Stay: Payer: PPO

## 2023-12-23 ENCOUNTER — Inpatient Hospital Stay: Payer: PPO | Attending: Hematology

## 2023-12-23 DIAGNOSIS — D751 Secondary polycythemia: Secondary | ICD-10-CM | POA: Insufficient documentation

## 2023-12-23 DIAGNOSIS — R7989 Other specified abnormal findings of blood chemistry: Secondary | ICD-10-CM | POA: Diagnosis not present

## 2023-12-23 LAB — CBC
HCT: 45.6 % (ref 39.0–52.0)
Hemoglobin: 16.1 g/dL (ref 13.0–17.0)
MCH: 32.4 pg (ref 26.0–34.0)
MCHC: 35.3 g/dL (ref 30.0–36.0)
MCV: 91.8 fL (ref 80.0–100.0)
Platelets: 143 10*3/uL — ABNORMAL LOW (ref 150–400)
RBC: 4.97 MIL/uL (ref 4.22–5.81)
RDW: 12.3 % (ref 11.5–15.5)
WBC: 6 10*3/uL (ref 4.0–10.5)
nRBC: 0 % (ref 0.0–0.2)

## 2023-12-23 NOTE — Progress Notes (Signed)
 No Phlebotomy needed today per patient parameters, patient made aware and discharged in satisfactory condition.

## 2024-01-04 DIAGNOSIS — M545 Low back pain, unspecified: Secondary | ICD-10-CM | POA: Diagnosis not present

## 2024-01-10 IMAGING — CT CT CHEST W/O CM
2 of 4 series · 15 of 36 positions shown, 18 images · non-contrast
Comparison: 04/10/2021

CLINICAL DATA: Follow-up pulmonary nodule



[Series 2: routine chest without · axial · non-contrast · 0.88mm/px · z∈[+1182,+1450]mm · 12 of 160 slices shown, 15 images]
[im 13/160  mediastinal]
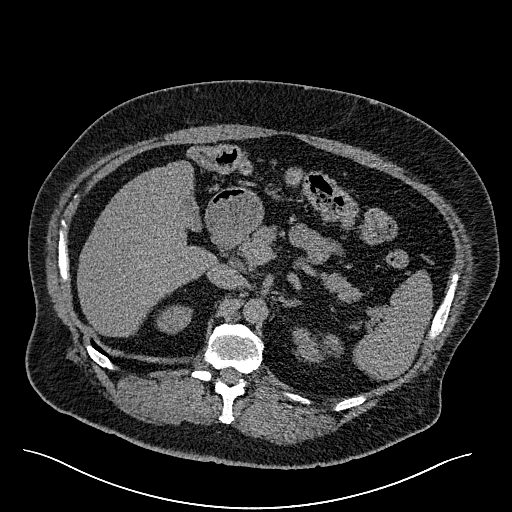
[im 13/160  lung]
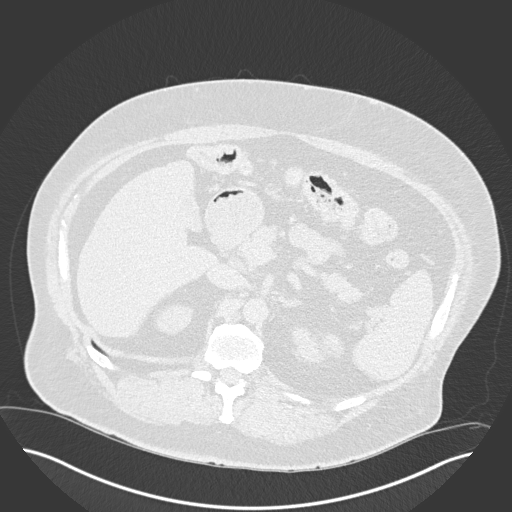
[im 25/160  lung]
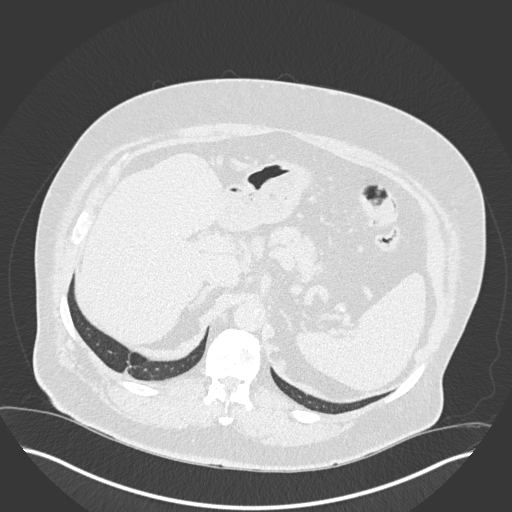
[im 37/160  lung]
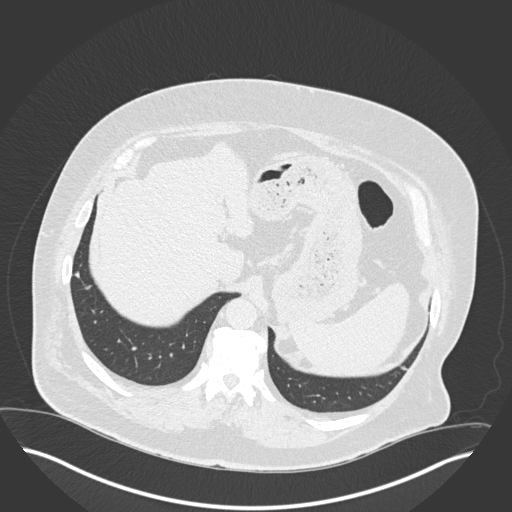
[im 49/160  lung]
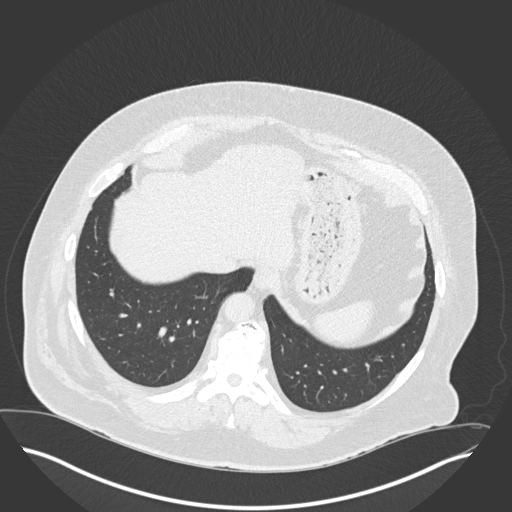
[im 62/160  mediastinal]
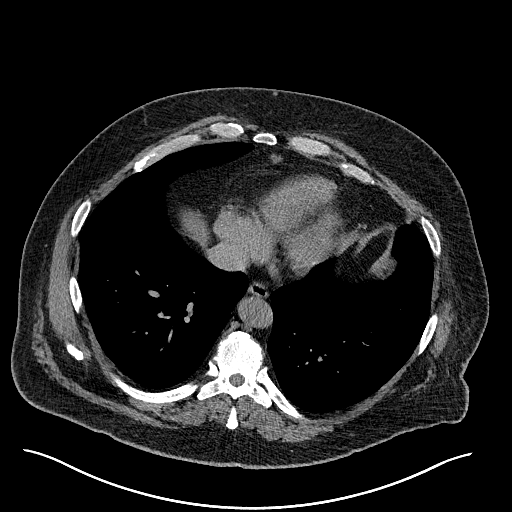
[im 62/160  lung]
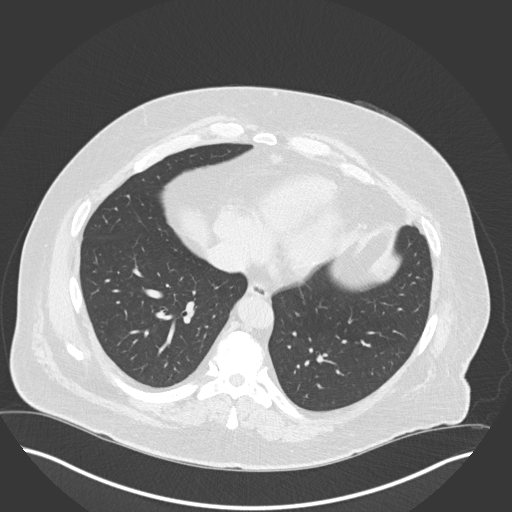
[im 74/160  lung]
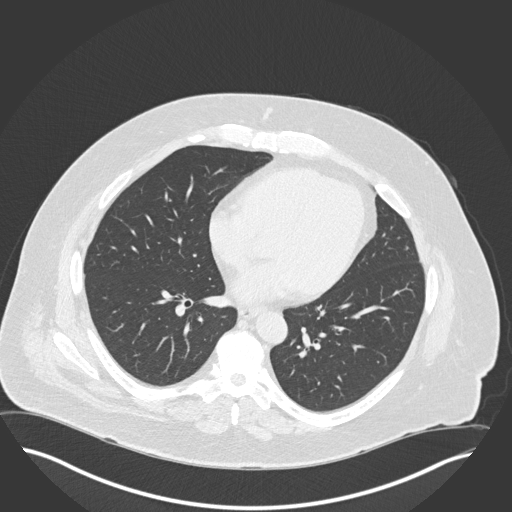
[im 86/160  lung]
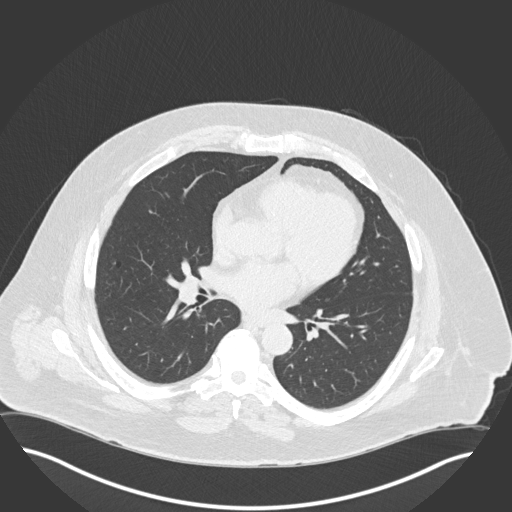
[im 98/160  lung]
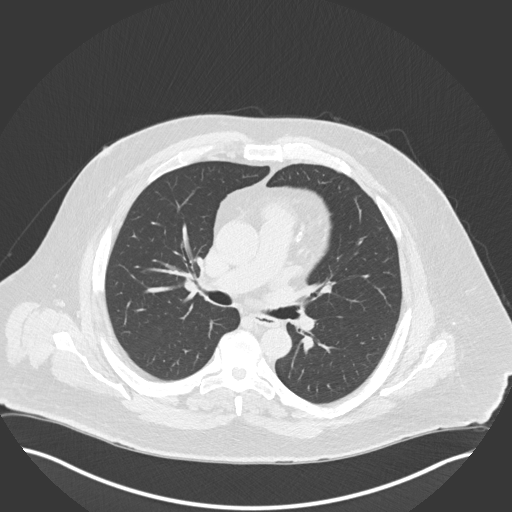
[im 111/160  mediastinal]
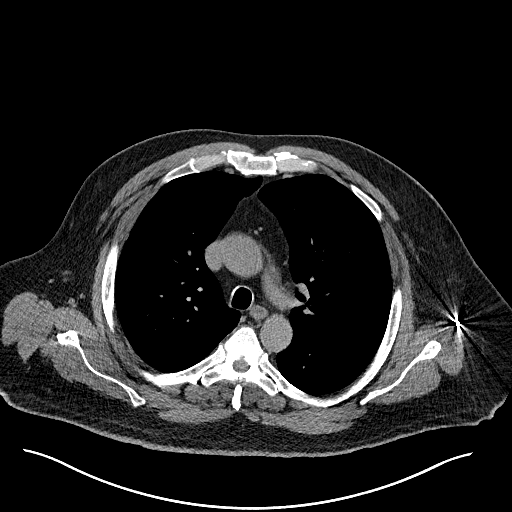
[im 111/160  lung]
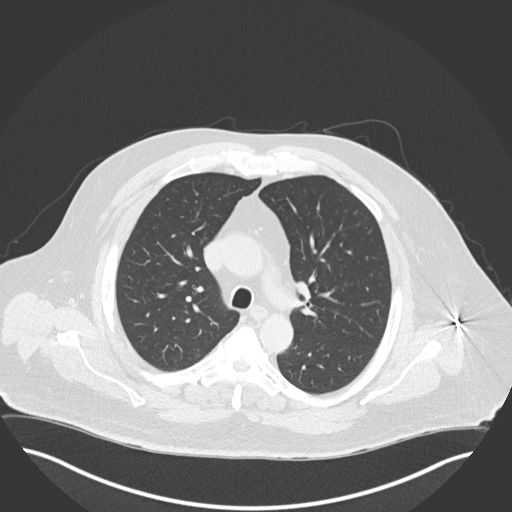
[im 123/160  lung]
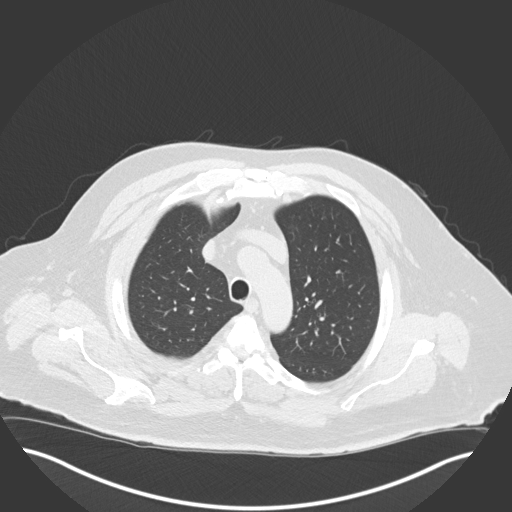
[im 135/160  lung]
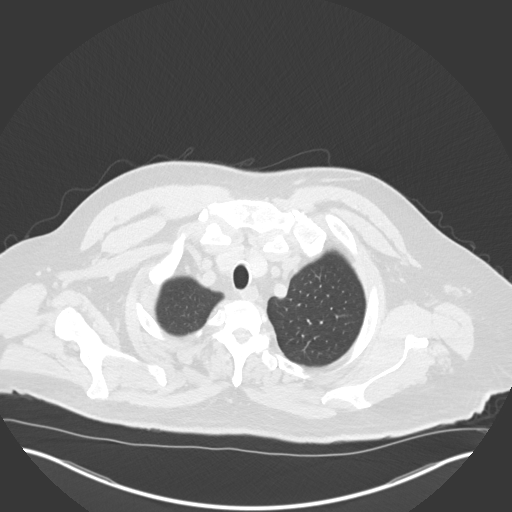
[im 147/160  lung]
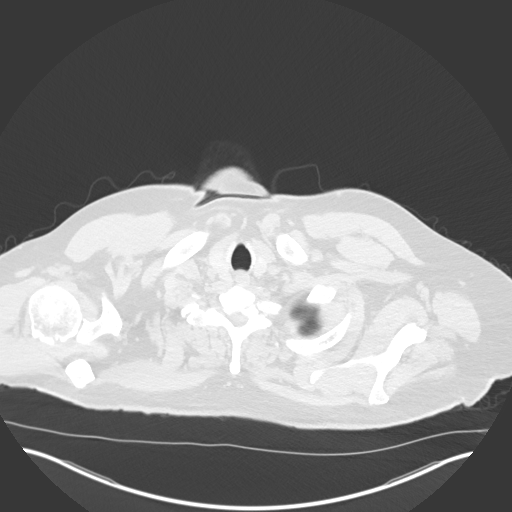

[Series 5: coronal · coronal · 0.68mm/px · 3 of 179 slices shown]
[im 36/179  lung]
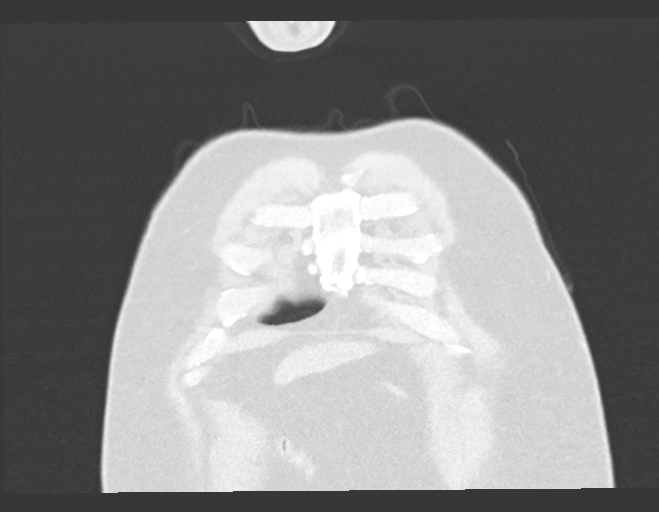
[im 72/179  lung]
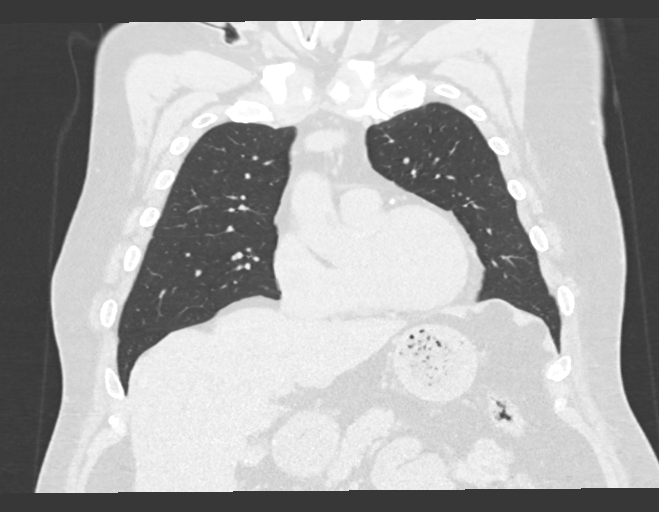
[im 107/179  lung]
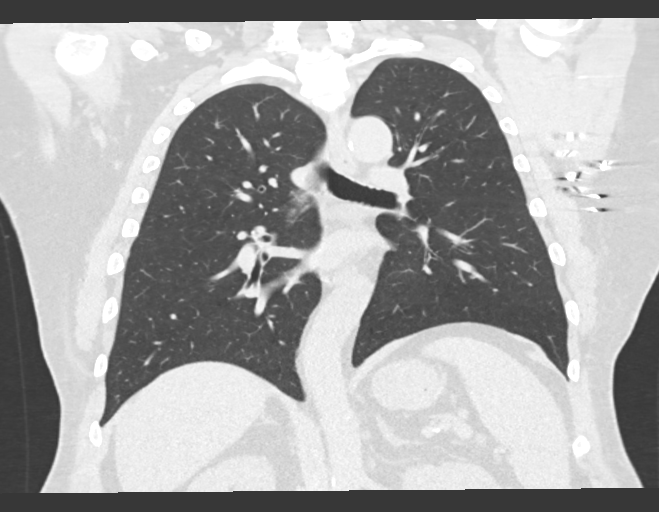

[15 of 36 positions shown; findings below may reference images not displayed]

FINDINGS: Cardiovascular: Heart size is normal. No pericardial effusion.
Aortic atherosclerosis and coronary artery calcifications. No
pericardial effusion.

Mediastinum/Nodes: No enlarged mediastinal or axillary lymph nodes.
Thyroid gland, trachea, and esophagus demonstrate no significant
findings.

Lungs/Pleura: No pleural effusion, airspace consolidation, or
pneumothorax.

Stable 7 mm nodule in the posterior right lower lobe, image 85/4.
(New on recent prior exam from 04/10/2021.)

[DATE] mm anterior right upper lobe lung nodule is stable, image 85/4.
(Also new on recent prior exam from 04/10/2021)

Stable adjacent 4 mm anterior right upper lobe pulmonary nodules,
image 65/4 and image 66/4.). (Stable since 4334 compatible with a
benign process).

Calcified granuloma is again noted in the lateral left lung base.

No new or suspicious lung nodules.

Upper Abdomen: No acute abnormality. The liver has a
coarsened/nodular contour with relative hypertrophy of the lateral
segment of left hepatic lobe. Multiple small calcified gallstones
are again seen.

Musculoskeletal: Spondylosis identified within the thoracic spine.
No acute or suspicious osseous findings.
IMPRESSION: 1. Stable right lung nodules measuring up to 7 mm. Future CT at
18-24
months (from today's scan) is considered optional for low-risk
patients, but is recommended for high-risk patients. This
recommendation follows the consensus statement: Guidelines for
Management of Incidental Pulmonary Nodules Detected on CT Images:
2. Morphologic features of the liver suggestive of cirrhosis.
3. Gallstones.
4. Coronary artery calcifications.
5. Aortic Atherosclerosis ([id number]-[id number]).

## 2024-01-15 DIAGNOSIS — M5416 Radiculopathy, lumbar region: Secondary | ICD-10-CM | POA: Diagnosis not present

## 2024-02-15 ENCOUNTER — Encounter: Payer: Self-pay | Admitting: Internal Medicine

## 2024-02-15 DIAGNOSIS — M5416 Radiculopathy, lumbar region: Secondary | ICD-10-CM | POA: Diagnosis not present

## 2024-02-24 DIAGNOSIS — I10 Essential (primary) hypertension: Secondary | ICD-10-CM | POA: Diagnosis not present

## 2024-02-24 DIAGNOSIS — R5383 Other fatigue: Secondary | ICD-10-CM | POA: Diagnosis not present

## 2024-02-24 DIAGNOSIS — E1165 Type 2 diabetes mellitus with hyperglycemia: Secondary | ICD-10-CM | POA: Diagnosis not present

## 2024-02-24 DIAGNOSIS — Z0001 Encounter for general adult medical examination with abnormal findings: Secondary | ICD-10-CM | POA: Diagnosis not present

## 2024-02-24 DIAGNOSIS — Z13220 Encounter for screening for lipoid disorders: Secondary | ICD-10-CM | POA: Diagnosis not present

## 2024-02-29 DIAGNOSIS — E1165 Type 2 diabetes mellitus with hyperglycemia: Secondary | ICD-10-CM | POA: Diagnosis not present

## 2024-02-29 DIAGNOSIS — I10 Essential (primary) hypertension: Secondary | ICD-10-CM | POA: Diagnosis not present

## 2024-02-29 DIAGNOSIS — F419 Anxiety disorder, unspecified: Secondary | ICD-10-CM | POA: Diagnosis not present

## 2024-02-29 DIAGNOSIS — K219 Gastro-esophageal reflux disease without esophagitis: Secondary | ICD-10-CM | POA: Diagnosis not present

## 2024-02-29 DIAGNOSIS — Z6837 Body mass index (BMI) 37.0-37.9, adult: Secondary | ICD-10-CM | POA: Diagnosis not present

## 2024-03-02 DIAGNOSIS — M47816 Spondylosis without myelopathy or radiculopathy, lumbar region: Secondary | ICD-10-CM | POA: Diagnosis not present

## 2024-03-14 ENCOUNTER — Ambulatory Visit
Admit: 2024-03-14 | Discharge: 2024-03-14 | Payer: PRIVATE HEALTH INSURANCE | Attending: Nurse Practitioner | Primary: Student in an Organized Health Care Education/Training Program

## 2024-03-14 VITALS — BP 128/78 | HR 58 | Temp 97.40000°F | Ht 68.0 in | Wt 170.0 lb

## 2024-03-14 DIAGNOSIS — M7022 Olecranon bursitis, left elbow: Principal | ICD-10-CM

## 2024-03-14 MED ORDER — SULFAMETHOXAZOLE-TRIMETHOPRIM 800-160 MG PO TABS
800-160 | ORAL_TABLET | Freq: Two times a day (BID) | ORAL | 0 refills | Status: AC
Start: 2024-03-14 — End: 2024-03-21

## 2024-03-14 MED ORDER — METHYLPREDNISOLONE 4 MG PO TBPK
4 | PACK | ORAL | 0 refills | Status: AC
Start: 2024-03-14 — End: 2024-03-20

## 2024-03-14 NOTE — Progress Notes (Signed)
 GEROLD SAR (DOB: April 21, 1961) is a 63 y.o. male, New patient, here for evaluation of the following chief complaint(s):  Elbow Swelling (Left elbow/Pt states his girlfriend noticed last Sunday his elbow was swollen and states he bumped it yesterday and was really sore/Pt states icing it to try to help with swelling/Pt states NKI before swelling started )    ASSESSMENT/PLAN:    ICD-10-CM    1. Olecranon bursitis of left elbow  M70.22 Ambulatory referral to [facility]          Given patient presentation with 1 week history of left elbow swelling and tenderness following bumping the elbow and increased swelling after repeat bumping of elbow I suspect olecranon bursitis of the left elbow.  There is low concern for septic bursitis at this time patient is not afebrile and exam is reassuring without erythema.  Low concern for fracture, discussed x-ray patient declined  Medrol  Dosepak prescribed to help reduce inflammation  Bactrim  prescribed empirically to prevent septic bursitis   RICE protocol recommended  Advised to avoid NSAIDs while taking steroid  ER precautions reviewed  Discussed PCP follow up for persisting or worsening symptoms, or to return to the clinic if unable to obtain PCP follow up for worsening symptoms.    The patient tolerated their visit well. The patient and/or the family were informed of the results of any tests, a time was given to answer questions, a plan was proposed and they agreed with plan. Reviewed AVS with treatment instructions and answered questions - pt/family expresses understanding and agreement with the discussed treatment plan and AVS instructions.      SUBJECTIVE/OBJECTIVE:  HPI:   63 y.o. male presents for complaint of left elbow swelling and tenderness   x 1 weeks that was worse yesterday after bumping.    Admits symptoms include left elbow swelling and tenderness  Denies warmth to touch or fever    Ice and compression makes symptoms better.  Bumping it makes symptoms  worse.    Has attempted ice and compression for symptoms     HPI provided by patient and is considered to be a reliable historian.                VITAL SIGNS  Vitals:    03/14/24 1220 03/14/24 1445   BP: (!) 140/92 128/78   Pulse: 58    Temp: 97.4 F (36.3 C)    TempSrc: Temporal    SpO2: 98%    Weight: 77.1 kg (170 lb)    Height: 1.727 m (5' 8)        Review of Systems   Musculoskeletal:  Positive for joint swelling.   All other systems reviewed and are negative.    Pertinent positives and negatives as above, or may be included within the HPI.    Physical Exam  Vitals reviewed.   Constitutional:       Appearance: Normal appearance.   HENT:      Head: Normocephalic.      Right Ear: Tympanic membrane normal.      Left Ear: Tympanic membrane normal.      Nose: Nose normal.      Right Sinus: No maxillary sinus tenderness or frontal sinus tenderness.      Left Sinus: No maxillary sinus tenderness or frontal sinus tenderness.      Mouth/Throat:      Lips: Pink.      Mouth: Mucous membranes are moist.      Pharynx: Oropharynx is  clear. Uvula midline.   Eyes:      Pupils: Pupils are equal, round, and reactive to light.   Neck:      Thyroid: No thyromegaly.   Cardiovascular:      Rate and Rhythm: Normal rate and regular rhythm.      Pulses: Normal pulses.      Heart sounds: Normal heart sounds, S1 normal and S2 normal.   Pulmonary:      Effort: Pulmonary effort is normal.      Breath sounds: Normal breath sounds.   Abdominal:      Palpations: Abdomen is soft.   Musculoskeletal:      Left elbow: Swelling and effusion present. Tenderness present in olecranon process.      Cervical back: Full passive range of motion without pain.      Right lower leg: No edema.      Left lower leg: No edema.   Lymphadenopathy:      Cervical: No cervical adenopathy.   Skin:     General: Skin is warm and dry.      Capillary Refill: Capillary refill takes less than 2 seconds.   Neurological:      Mental Status: He is alert and oriented to  person, place, and time.   Psychiatric:         Mood and Affect: Mood normal.         Behavior: Behavior is cooperative.       PROCEDURES:  Unless otherwise noted below, none     Procedures    RESULTS:  No results found for this visit on 03/14/24.  An electronic signature was used to authenticate this note.    --Delon LITTIE Grimes, APRN - CNP

## 2024-03-14 NOTE — Patient Instructions (Addendum)
 Medrol  Dosepak prescribed to help treat the inflammation.  Do not take ibuprofen while taking this Tylenol is okay  RICE protocol recommended rest, ice, compress, elevate  Avoid any new trauma  Bactrim  DS prescribed to help protect against septic joint  For worsening pain or fever seek immediate care in the ER otherwise follow-up with primary care provider, referral provided      Alm,    Thank you for trusting [facility] with your care. Your decision to come to us  means a lot and we are honored to be part of your healthcare journey. We value your trust and hope your experience with us  was positive and met your expectations.    We're always looking for ways to improve, and your feedback is incredibly important to us . You will receive a text or email soon asking you how your visit went. for If you could take a moment to share your thoughts, it would mean the world to us . Your input helps us  better serve you and others in the community.     Thank you again for choosing us . We're grateful for the opportunity to care for you and your loved ones. We hope to see you again - though we always wish you health and wellness!    Warm regards,    [facility]    Ashley Lorezen MA, Delon Charika Mikelson NP-C, and [employer] [place]

## 2024-03-15 ENCOUNTER — Encounter
Payer: PRIVATE HEALTH INSURANCE | Attending: Orthopaedic Surgery | Primary: Student in an Organized Health Care Education/Training Program

## 2024-03-16 NOTE — Progress Notes (Unsigned)
 Referring Provider: Jeanette Comer BRAVO, * Primary Care Physician:  Jeanette Comer BRAVO, PA-C Primary GI Physician: Dr. Shaaron  Chief Complaint  Patient presents with   Follow-up    Follow up. No problems    HPI:   [name] is a 63 y.o. male with history of anxiety, depression, diabetes that is diet-controlled, HTN, GERD, alcoholism, cirrhosis, elevated ferritin, heterozygous for H63D, negative for C282Y and following with hematology for phlebotomy, presenting today for follow-up of cirrhosis.  Prior work-up in 2024:  ANA, AMA, ASMA , IgG, IgA wnl. IgM slightly elevated at 208, normal ceruloplasmin and alpha 1 antitrypsin phenotype.  Hep C Ab negative, Hep B surface Ab non reactive, Hep B core Ab non-reactive, Hep B surface Ag non reactive, Hep A Ab non reactive.   EGD 02/04/23:  - Normal esophagus. - Portal hypertensive gastropathy ( very mild) . - Normal duodenal bulb and second portion of the duodenum. - Per Dr. Ivonne OV 09/22/23, patient should have EGD in about 1.5 years (2 years from EGD).   Colonoscopy 02/04/23: - Diverticulosis in the entire examined colon. Melanosis coli. - The examination was otherwise normal on direct and retroflexion views. -  Non- bleeding internal hemorrhoids. Anal papilla  - No specimens collected. - Recommended 5 year surveillance.    Today:  Cirrhosis:  MELD 3.0: 8 in June 2024.  US : May 2024 without focal liver lesion.  AFP: 5.4 12/17/22 Hep A/B vaccination: Hasn't started series.   Ascites/peripheral edema: None Diuretics: None Paracentesis:  History of SBP:  Encephalopathy:  None.    GERD: Well controlled.   Denies abdominal pain, nausea, vomiting, dysphagia, BRBPR, melena.  Reports bowels are moving well.  Past Medical History:  Diagnosis Date   Alcoholism (HCC)    Anxiety    Arthritis    Complication of anesthesia    Elevated Blood pressure (critical) during leg surgery   Depression    Diabetes (HCC)     Gallstones    GERD (gastroesophageal reflux disease)    Hemochromatosis    History of kidney stones    Hypertension    Obesity     Past Surgical History:  Procedure Laterality Date   ANKLE SURGERY     left   COLONOSCOPY WITH PROPOFOL  N/A 02/04/2023   Procedure: COLONOSCOPY WITH PROPOFOL ;  Surgeon: Shaaron Lamar HERO, MD;  [facility];  Service: Endoscopy;  Laterality: N/A;  10:45 am, asa 3   ESOPHAGOGASTRODUODENOSCOPY (EGD) WITH PROPOFOL  N/A 02/04/2023   Procedure: ESOPHAGOGASTRODUODENOSCOPY (EGD) WITH PROPOFOL ;  Surgeon: Shaaron Lamar HERO, MD;  Location: [facility];  Service: Endoscopy;  Laterality: N/A;   KNEE SURGERY     right   LEG SURGERY     bilateral    Current Outpatient Medications  Medication Sig Dispense Refill   ALPRAZolam (XANAX) 1 MG tablet Take 1-2 mg by mouth See admin instructions. Take 1 tablet (1 mg) by mouth in the morning & take 2 tablets (2 mg) by mouth at bedtime.     aspirin  EC (ASPIRIN  EC ADULT LOW DOSE) 81 MG tablet Take 1 tablet (81 mg total) by mouth daily. Swallow whole. 90 tablet 3   ibuprofen (ADVIL) 800 MG tablet Take 800 mg by mouth 2 (two) times daily as needed (pain.).     lisinopril (PRINIVIL,ZESTRIL) 5 MG tablet Take 5 mg by mouth in the morning.     omeprazole (PRILOSEC) 20 MG capsule Take 20 mg by mouth daily before breakfast.  No current facility-administered medications for this visit.    Allergies as of 03/17/2024 - Review Complete 03/17/2024  Allergen Reaction Noted   Clindamycin/lincomycin Rash 02/21/2013   Penicillins Nausea And Vomiting and Other (See Comments) 02/21/2013    Family History  Problem Relation Age of Onset   Liver cancer Mother    Cancer Unknown        mult uncles   Cancer Sister    Colon cancer Neg Hx    Stomach cancer Neg Hx     Social History   Socioeconomic History   Marital status: Married    Spouse name: Not on file   Number of children: Not on file   Years of education: Not on file    Highest education level: Not on file  Occupational History   Not on file  Tobacco Use   Smoking status: Former    Current packs/day: 0.00    Average packs/day: 2.0 packs/day for 20.0 years (40.0 ttl pk-yrs)    Types: Cigarettes    Start date: 12/2001    Quit date: 12/2021    Years since quitting: 2.2   Smokeless tobacco: Never  Substance and Sexual Activity   Alcohol  use: Yes    Comment: 5-6 beer daily.   Drug use: No   Sexual activity: Not on file  Other Topics Concern   Not on file  Social History Narrative   Not on file   Social Drivers of Health   Financial Resource Strain: Low Risk  (03/18/2021)   Received from [facility]   Overall Financial Resource Strain (CARDIA)    Difficulty of Paying Living Expenses: Not hard at all  Food Insecurity: No Food Insecurity (03/18/2021)   Received from [facility]   Hunger Vital Sign    Within the past 12 months, you worried that your food would run out before you got the money to buy more.: Never true    Within the past 12 months, the food you bought just didn't last and you didn't have money to get more.: Never true  Transportation Needs: No Transportation Needs (03/18/2021)   Received from [facility]   PRAPARE - Transportation    Lack of Transportation (Medical): No    Lack of Transportation (Non-Medical): No  Physical Activity: Sufficiently Active (03/18/2021)   Received from [facility]   Exercise Vital Sign    On average, how many days per week do you engage in moderate to strenuous exercise (like a brisk walk)?: 7 days    On average, how many minutes do you engage in exercise at this level?: 30 min  Stress: No Stress Concern Present (03/18/2021)   Received from [facility] - Occupational Stress Questionnaire    Feeling of Stress : Not at all  Social Connections: Moderately Isolated (03/18/2021)   Received from [facility]   Social Connection and Isolation  Panel    In a typical week, how many times do you talk on the phone with family, friends, or neighbors?: Three times a week    How often do you get together with friends or relatives?: Never    How often do you attend church or religious services?: Never    Do you belong to any clubs or organizations such as church groups, unions, fraternal or athletic groups, or school groups?: No    How often do you attend meetings of the clubs or organizations you belong to?:  Never    Are you married, widowed, divorced, separated, never married, or living with a partner?: Married    Review of Systems: Gen: Denies fever, chills, cold or flulike symptoms, presyncope, syncope. CV: Denies chest pain, palpitations. Resp: Denies dyspnea at rest, cough. GI: See HPI Heme: See HPI  Physical Exam: BP 137/84 (BP Location: Right Arm, Patient Position: Sitting, Cuff Size: Large)   Pulse 82   Temp 98.1 F (36.7 C) (Temporal)   Ht 5' 9 (1.753 m)   Wt 258 lb (117 kg)   BMI 38.10 kg/m  General:   Alert and oriented. No distress noted. Pleasant and cooperative.  Head:  Normocephalic and atraumatic. Eyes:  Conjuctiva clear without scleral icterus. Heart:  S1, S2 present without murmurs appreciated. Lungs:  Clear to auscultation bilaterally. No wheezes, rales, or rhonchi. No distress.  Abdomen:  +BS, obese, large AP diameter, soft, nontender.  No rebound or guarding. No HSM or masses noted. Msk:  Symmetrical without gross deformities. Normal posture. Extremities:  Without edema. Neurologic:  Alert and  oriented x4 Psych:  Normal mood and affect.    Assessment:  63 y.o. male with history of anxiety, depression, diabetes that is diet-controlled, HTN, GERD, alcoholism, cirrhosis, elevated ferritin, heterozygous for H63D, negative for C282Y and following with hematology for phlebotomy, presenting today for follow-up of cirrhosis.  Cirrhosis: Likely ASH/MASH etiology.  Extensive serologic workup as detailed in  HPI.  Remains well compensated.  Currently overdue for labs and imaging.  MELD 8 in June 2024.  Last ultrasound in May 2024 without focal liver lesion.  EGD up-to-date in June 2024 without varices.  Recommended 2-year surveillance.  Needs to complete hep A/B vaccine series.   Unfortunately, patient has continued to drink alcohol  daily.  We had a thorough discussion today regarding the importance of alcohol  cessation in order to prevent progression of cirrhosis and for his ability to be a candidate for a liver transplant should this become the need for him.  GERD: Well-controlled on omeprazole 20 mg daily.  No alarm symptoms.  Plan:  CBC, CMP, INR, AFP RUQ ultrasound Complete hepatitis A/B vaccine series.  Prescription provided. Counseled on the importance of alcohol  cessation. Nutrition Recommendations:  High-protein diet from a primarily plant-based diet. Avoid red meat.  No raw or undercooked meat, seafood, or shellfish. Low-fat/cholesterol/carbohydrate diet. Limit sodium to no more than 2000 mg/day including everything that you eat and drink. Recommend at least 30 minutes of aerobic and resistance exercise 3 days/week. Continue omeprazole 20 mg daily. Follow-up in 6 months or sooner if needed.   Josette Centers, PA-C [facility] 03/17/2024

## 2024-03-17 ENCOUNTER — Encounter: Payer: Self-pay | Admitting: Gastroenterology

## 2024-03-17 ENCOUNTER — Encounter: Payer: Self-pay | Admitting: *Deleted

## 2024-03-17 ENCOUNTER — Ambulatory Visit: Admitting: Gastroenterology

## 2024-03-17 VITALS — BP 137/84 | HR 82 | Temp 98.1°F | Ht 69.0 in | Wt 258.0 lb

## 2024-03-17 DIAGNOSIS — F1090 Alcohol use, unspecified, uncomplicated: Secondary | ICD-10-CM | POA: Diagnosis not present

## 2024-03-17 DIAGNOSIS — K219 Gastro-esophageal reflux disease without esophagitis: Secondary | ICD-10-CM | POA: Diagnosis not present

## 2024-03-17 DIAGNOSIS — K7460 Unspecified cirrhosis of liver: Secondary | ICD-10-CM

## 2024-03-17 NOTE — Patient Instructions (Addendum)
 We will arrange for you to have an ultrasound of your liver at [facility].  You can have blood work completed at the hospital when you go to have your ultrasound.  I recommend that you complete hepatitis A and B vaccine series.  You can have this completed at your primary care doctor's office or at a local pharmacy.  We are providing you with a prescription today.  Nutrition:  High-protein diet from a primarily plant-based diet. Avoid red meat.  No raw or undercooked meat, seafood, or shellfish. Low-fat/cholesterol/carbohydrate diet. Limit sodium to no more than 2000 mg/day including everything that you eat and drink. Recommend at least 30 minutes of aerobic and resistance exercise 3 days/week.  As we discussed, it is important that you stop drinking all alcohol  due to your history of cirrhosis in order to prevent progression and for the potential to remain a candidate for liver transplant should this become a need for you.  It was good to see you again today!  I am glad you are doing well overall!  We will plan to see you back in 6 months or sooner if needed.  Josette Centers, PA-C [facility]

## 2024-03-21 DIAGNOSIS — M47816 Spondylosis without myelopathy or radiculopathy, lumbar region: Secondary | ICD-10-CM | POA: Diagnosis not present

## 2024-03-23 ENCOUNTER — Inpatient Hospital Stay: Payer: PPO | Attending: Hematology

## 2024-03-23 ENCOUNTER — Inpatient Hospital Stay: Payer: PPO

## 2024-03-23 DIAGNOSIS — D751 Secondary polycythemia: Secondary | ICD-10-CM | POA: Diagnosis not present

## 2024-03-23 LAB — CBC
HCT: 44.6 % (ref 39.0–52.0)
Hemoglobin: 16.4 g/dL (ref 13.0–17.0)
MCH: 33.8 pg (ref 26.0–34.0)
MCHC: 36.8 g/dL — ABNORMAL HIGH (ref 30.0–36.0)
MCV: 92 fL (ref 80.0–100.0)
Platelets: 149 K/uL — ABNORMAL LOW (ref 150–400)
RBC: 4.85 MIL/uL (ref 4.22–5.81)
RDW: 12.6 % (ref 11.5–15.5)
WBC: 6.8 K/uL (ref 4.0–10.5)
nRBC: 0 % (ref 0.0–0.2)

## 2024-03-23 NOTE — Progress Notes (Signed)
 NO phlebotomy needed per oncology follow up notes.  Patient given copy of labs with no complaints voiced.  No s/s of distress noted.

## 2024-03-25 ENCOUNTER — Ambulatory Visit
Admit: 2024-03-25 | Payer: PRIVATE HEALTH INSURANCE | Attending: Student in an Organized Health Care Education/Training Program | Primary: Student in an Organized Health Care Education/Training Program

## 2024-03-25 VITALS — BP 108/70 | HR 85 | Ht 68.0 in | Wt 166.0 lb

## 2024-03-25 DIAGNOSIS — M7022 Olecranon bursitis, left elbow: Principal | ICD-10-CM

## 2024-03-25 NOTE — Progress Notes (Signed)
 [facility]  [address]  [place], [place] [address]    Assessment/Plan:    Brandon Terrell was seen today for establish care.    Diagnoses and all orders for this visit:    Olecranon bursitis of left elbow  -     [facility]      Assessment & Plan  1. Left elbow bursitis.  - The left elbow shows signs of redness and warmth, indicating possible infection or irritation.  Given unable to rule out septic bursitis advised patient to go to same-day Ortho urgent care for further evaluation.  - He has been on antibiotics and a steroid pack, but the symptoms have not improved.  - An urgent referral to orthopedics has been made for further evaluation and potential drainage of the elbow.    - If orthopedics cannot see him today, he is advised to visit an orthopedic urgent care center.    Follow-up: A follow-up appointment is scheduled for 2 weeks from now.    Return in about 1 month (around 04/25/2024) for Followup. EOV. .     Patient: Brandon Terrell is a pleasant 63 y.o.male who presents today for:      Chief Complaint   Patient presents with    Establish Care       HPI:     History of Present Illness  The patient is a 63 year old male who presents as a new patient for evaluation of left elbow bursitis. He is accompanied by his fiance.    He sought medical attention at an urgent care facility on 03/14/2024 due to a swollen and painful left elbow. He was prescribed a course of antibiotics and steroids, which he completed on 03/23/2024. Despite the treatment, he continues to experience soreness in his elbow, particularly when touched. The swelling has slightly subsided, but the redness persists. He has been applying ice to the affected area and wrapping it with an Ace bandage. He was advised to consult a primary care physician. If his condition does not improve within 10 days, he will return to the urgent care facility. He applied an ice pack to his elbow before  today's appointment.    He does not recall any specific incident that could have caused the injury. 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 He also works on his boat. He reports feeling like he has reached his physical limit and lacks the strength to perform his usual tasks. He has been dealing with this issue for the past two weeks.    He typically experiences soreness in his knee, which then radiates to his back and neck, causing stiffness. This usually occurs after strenuous work or exertion.    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  Recreational Drugs: The patient smokes marijuana.    Patient's pertinent past medical history and medications were reviewed and updated as appropriate today.    Review of Systems:  Review of Systems   Constitutional:  Negative for fatigue and fever.   HENT:  Negative for congestion.    Respiratory:  Negative for cough and shortness of breath.    Cardiovascular:  Negative for leg swelling.   Gastrointestinal:  Negative for constipation and diarrhea.   Genitourinary:  Negative for dysuria.   Neurological:  Negative for headaches.  Psychiatric/Behavioral:  Negative for sleep disturbance. The patient is not nervous/anxious.        Objective:    Physical Exam:   Vitals:    03/25/24 1517   BP: 108/70   Pulse: 85   SpO2: 95%   Weight: 75.3 kg (166 lb)   Height: 1.727 m (5' 8)       Physical Exam  HENT:      Head: Normocephalic.   Eyes:      Pupils: Pupils are equal, round, and reactive to light.   Cardiovascular:      Rate and Rhythm: Normal rate and regular rhythm.   Pulmonary:      Effort: Pulmonary effort is normal.      Breath sounds: Normal breath sounds.   Musculoskeletal:      Cervical back: Normal range of motion.      Comments: Erythematous soft swelling approximately 6 x 6 cm with  minimal tenderness to palpation.  Full range of motion to elbow joint.   Skin:     General: Skin is warm and dry.   Neurological:      General: No focal deficit present.      Mental Status: He is alert.          Sincerely,  Leyda Vanderwerf Shari Raddle., MD MPH  Family & Preventive Medicine    This note was generated completely or in part utilizing Dragon dictation speech recognition software.  Occasionally, words are mistranscribed and despite editing, the text may contain inaccuracies due to incorrect word recognition.  If further clarification is needed please contact the office     The patient (or guardian, if applicable) and other individuals in attendance with the patient were advised that Artificial Intelligence will be utilized during this visit to record, process the conversation to generate a clinical note, and support improvement of the AI technology. The patient (or guardian, if applicable) and other individuals in attendance at the appointment consented to the use of AI, including the recording.

## 2024-03-29 ENCOUNTER — Other Ambulatory Visit (HOSPITAL_COMMUNITY)
Admission: RE | Admit: 2024-03-29 | Discharge: 2024-03-29 | Disposition: A | Discharge status: Home / Self Care | Source: Ambulatory Visit | Attending: Gastroenterology | Admitting: Gastroenterology

## 2024-03-29 ENCOUNTER — Ambulatory Visit: Payer: Self-pay | Admitting: Gastroenterology

## 2024-03-29 ENCOUNTER — Ambulatory Visit (HOSPITAL_COMMUNITY)
Admission: RE | Admit: 2024-03-29 | Discharge: 2024-03-29 | Disposition: A | Discharge status: Home / Self Care | Source: Ambulatory Visit | Attending: Gastroenterology | Admitting: Gastroenterology

## 2024-03-29 DIAGNOSIS — K8020 Calculus of gallbladder without cholecystitis without obstruction: Secondary | ICD-10-CM | POA: Diagnosis not present

## 2024-03-29 DIAGNOSIS — K7460 Unspecified cirrhosis of liver: Secondary | ICD-10-CM | POA: Diagnosis not present

## 2024-03-29 LAB — CBC WITH DIFFERENTIAL/PLATELET
Abs Immature Granulocytes: 0.01 K/uL (ref 0.00–0.07)
Basophils Absolute: 0.1 K/uL (ref 0.0–0.1)
Basophils Relative: 1 %
Eosinophils Absolute: 0.2 K/uL (ref 0.0–0.5)
Eosinophils Relative: 4 %
HCT: 45.8 % (ref 39.0–52.0)
Hemoglobin: 16.6 g/dL (ref 13.0–17.0)
Immature Granulocytes: 0 %
Lymphocytes Relative: 32 %
Lymphs Abs: 1.5 K/uL (ref 0.7–4.0)
MCH: 33.3 pg (ref 26.0–34.0)
MCHC: 36.2 g/dL — ABNORMAL HIGH (ref 30.0–36.0)
MCV: 92 fL (ref 80.0–100.0)
Monocytes Absolute: 0.5 K/uL (ref 0.1–1.0)
Monocytes Relative: 10 %
Neutro Abs: 2.6 K/uL (ref 1.7–7.7)
Neutrophils Relative %: 53 %
Platelets: 143 K/uL — ABNORMAL LOW (ref 150–400)
RBC: 4.98 MIL/uL (ref 4.22–5.81)
RDW: 12.4 % (ref 11.5–15.5)
WBC: 4.9 K/uL (ref 4.0–10.5)
nRBC: 0 % (ref 0.0–0.2)

## 2024-03-29 LAB — COMPREHENSIVE METABOLIC PANEL WITH GFR
ALT: 52 U/L — ABNORMAL HIGH (ref 0–44)
AST: 47 U/L — ABNORMAL HIGH (ref 15–41)
Albumin: 3.9 g/dL (ref 3.5–5.0)
Alkaline Phosphatase: 60 U/L (ref 38–126)
Anion gap: 10 (ref 5–15)
BUN: 13 mg/dL (ref 6–20)
CO2: 26 mmol/L (ref 22–32)
Calcium: 9.4 mg/dL (ref 8.9–10.3)
Chloride: 103 mmol/L (ref 98–111)
Creatinine, Ser: 0.94 mg/dL (ref 0.61–1.24)
GFR, Estimated: >60 mL/min (ref >60)
Glucose, Bld: 100 mg/dL — ABNORMAL HIGH (ref 70–99)
Potassium: 5 mmol/L (ref 3.5–5.1)
Sodium: 139 mmol/L (ref 135–145)
Total Bilirubin: 1.2 mg/dL (ref 0.0–1.2)
Total Protein: 7.5 g/dL (ref 6.5–8.1)

## 2024-03-29 LAB — PROTIME-INR
INR: 1 (ref 0.8–1.2)
Prothrombin Time: 14.2 s (ref 11.4–15.2)

## 2024-03-30 LAB — AFP TUMOR MARKER: AFP, Serum, Tumor Marker: 4.7 ng/mL (ref 0.0–8.4)

## 2024-03-31 ENCOUNTER — Ambulatory Visit: Payer: Self-pay | Admitting: Gastroenterology

## 2024-04-04 DIAGNOSIS — M47816 Spondylosis without myelopathy or radiculopathy, lumbar region: Secondary | ICD-10-CM | POA: Diagnosis not present

## 2024-04-07 ENCOUNTER — Other Ambulatory Visit: Payer: Self-pay

## 2024-04-19 DIAGNOSIS — M47816 Spondylosis without myelopathy or radiculopathy, lumbar region: Secondary | ICD-10-CM | POA: Diagnosis not present

## 2024-04-27 DIAGNOSIS — Z6838 Body mass index (BMI) 38.0-38.9, adult: Secondary | ICD-10-CM | POA: Diagnosis not present

## 2024-04-27 DIAGNOSIS — R0602 Shortness of breath: Secondary | ICD-10-CM | POA: Diagnosis not present

## 2024-04-27 DIAGNOSIS — Z1329 Encounter for screening for other suspected endocrine disorder: Secondary | ICD-10-CM | POA: Diagnosis not present

## 2024-04-27 DIAGNOSIS — L259 Unspecified contact dermatitis, unspecified cause: Secondary | ICD-10-CM | POA: Diagnosis not present

## 2024-05-24 DIAGNOSIS — R945 Abnormal results of liver function studies: Secondary | ICD-10-CM | POA: Diagnosis not present

## 2024-05-24 DIAGNOSIS — R5383 Other fatigue: Secondary | ICD-10-CM | POA: Diagnosis not present

## 2024-05-24 DIAGNOSIS — Z13220 Encounter for screening for lipoid disorders: Secondary | ICD-10-CM | POA: Diagnosis not present

## 2024-05-24 DIAGNOSIS — E1165 Type 2 diabetes mellitus with hyperglycemia: Secondary | ICD-10-CM | POA: Diagnosis not present

## 2024-05-31 ENCOUNTER — Other Ambulatory Visit: Payer: Self-pay | Admitting: Physician Assistant

## 2024-05-31 DIAGNOSIS — K219 Gastro-esophageal reflux disease without esophagitis: Secondary | ICD-10-CM | POA: Diagnosis not present

## 2024-05-31 DIAGNOSIS — F419 Anxiety disorder, unspecified: Secondary | ICD-10-CM | POA: Diagnosis not present

## 2024-05-31 DIAGNOSIS — R0602 Shortness of breath: Secondary | ICD-10-CM | POA: Diagnosis not present

## 2024-05-31 DIAGNOSIS — D751 Secondary polycythemia: Secondary | ICD-10-CM

## 2024-05-31 DIAGNOSIS — I10 Essential (primary) hypertension: Secondary | ICD-10-CM | POA: Diagnosis not present

## 2024-05-31 DIAGNOSIS — E1165 Type 2 diabetes mellitus with hyperglycemia: Secondary | ICD-10-CM | POA: Diagnosis not present

## 2024-05-31 DIAGNOSIS — K7460 Unspecified cirrhosis of liver: Secondary | ICD-10-CM | POA: Diagnosis not present

## 2024-05-31 DIAGNOSIS — Z23 Encounter for immunization: Secondary | ICD-10-CM | POA: Diagnosis not present

## 2024-05-31 DIAGNOSIS — Z6837 Body mass index (BMI) 37.0-37.9, adult: Secondary | ICD-10-CM | POA: Diagnosis not present

## 2024-05-31 DIAGNOSIS — R931 Abnormal findings on diagnostic imaging of heart and coronary circulation: Secondary | ICD-10-CM | POA: Diagnosis not present

## 2024-05-31 DIAGNOSIS — R0600 Dyspnea, unspecified: Secondary | ICD-10-CM | POA: Diagnosis not present

## 2024-06-02 DIAGNOSIS — M47816 Spondylosis without myelopathy or radiculopathy, lumbar region: Secondary | ICD-10-CM | POA: Diagnosis not present

## 2024-06-02 DIAGNOSIS — M5416 Radiculopathy, lumbar region: Secondary | ICD-10-CM | POA: Diagnosis not present

## 2024-06-15 ENCOUNTER — Inpatient Hospital Stay: Payer: PPO | Admitting: Physician Assistant

## 2024-06-15 ENCOUNTER — Inpatient Hospital Stay: Payer: PPO | Attending: Hematology

## 2024-06-15 DIAGNOSIS — R7989 Other specified abnormal findings of blood chemistry: Secondary | ICD-10-CM | POA: Insufficient documentation

## 2024-06-15 DIAGNOSIS — D751 Secondary polycythemia: Secondary | ICD-10-CM | POA: Insufficient documentation

## 2024-06-15 DIAGNOSIS — F17210 Nicotine dependence, cigarettes, uncomplicated: Secondary | ICD-10-CM | POA: Diagnosis not present

## 2024-06-15 LAB — CBC WITH DIFFERENTIAL/PLATELET
Abs Immature Granulocytes: 0.02 K/uL (ref 0.00–0.07)
Basophils Absolute: 0.1 K/uL (ref 0.0–0.1)
Basophils Relative: 1 %
Eosinophils Absolute: 0.2 K/uL (ref 0.0–0.5)
Eosinophils Relative: 4 %
HCT: 46.8 % (ref 39.0–52.0)
Hemoglobin: 16.5 g/dL (ref 13.0–17.0)
Immature Granulocytes: 0 %
Lymphocytes Relative: 32 %
Lymphs Abs: 1.9 K/uL (ref 0.7–4.0)
MCH: 32.7 pg (ref 26.0–34.0)
MCHC: 35.3 g/dL (ref 30.0–36.0)
MCV: 92.9 fL (ref 80.0–100.0)
Monocytes Absolute: 0.4 K/uL (ref 0.1–1.0)
Monocytes Relative: 8 %
Neutro Abs: 3.2 K/uL (ref 1.7–7.7)
Neutrophils Relative %: 55 %
Platelets: 172 K/uL (ref 150–400)
RBC: 5.04 MIL/uL (ref 4.22–5.81)
RDW: 12.2 % (ref 11.5–15.5)
WBC: 5.8 K/uL (ref 4.0–10.5)
nRBC: 0 % (ref 0.0–0.2)

## 2024-06-15 LAB — IRON AND TIBC
Iron: 107 ug/dL (ref 45–182)
Saturation Ratios: 37 % (ref 17.9–39.5)
TIBC: 291 ug/dL (ref 250–450)
UIBC: 184 ug/dL

## 2024-06-15 LAB — FERRITIN: Ferritin: 379 ng/mL — ABNORMAL HIGH (ref 24–336)

## 2024-06-15 LAB — CARBOXYHEMOGLOBIN - COOX: Carboxyhemoglobin: 0.8 % (ref 0.5–1.5)

## 2024-06-17 ENCOUNTER — Ambulatory Visit
Admit: 2024-06-17 | Discharge: 2024-06-23 | Payer: PRIVATE HEALTH INSURANCE | Attending: Registered Nurse | Primary: Student in an Organized Health Care Education/Training Program

## 2024-06-17 VITALS — BP 138/88 | HR 70 | Ht 67.99 in | Wt 177.0 lb

## 2024-06-17 DIAGNOSIS — Z0000 Encounter for general adult medical examination without abnormal findings: Principal | ICD-10-CM

## 2024-06-17 LAB — CARBON MONOXIDE, BLOOD (PERFORMED AT REF LAB): Carbon Monoxide, Blood: 4 % — ABNORMAL HIGH (ref 0.0–3.6)

## 2024-06-17 NOTE — Patient Instructions (Signed)
"       Well Visit, Ages 25 to 52: Care Instructions  Well visits can help you stay healthy. Your doctor has checked your overall health and may have suggested ways to take good care of yourself. Your doctor also may have recommended tests. You can help prevent illness with healthy eating, good sleep, vaccinations, regular exercise, and other steps.    Get the tests that you and your doctor decide on. Depending on your age and risks, examples might include screening for diabetes; hepatitis C; HIV; and cervical, breast, lung, and colon cancer. Screening helps find diseases before any symptoms appear.   Eat healthy foods. Choose fruits, vegetables, whole grains, lean protein, and low-fat dairy foods. Limit saturated fat and reduce salt.     Limit alcohol. Men should have no more than 2 drinks a day. Women should have no more than 1. For some people, no alcohol is the best choice.   Exercise. Get at least 30 minutes of exercise on most days of the week. Walking can be a good choice.     Reach and stay at your healthy weight. This will lower your risk for many health problems.   Take care of your mental health. Try to stay connected with friends, family, and community, and find ways to manage stress.     If you're feeling depressed or hopeless, talk to someone. A counselor can help. If you don't have a counselor, talk to your doctor.   Talk to your doctor if you think you may have a problem with alcohol or drug use. This includes prescription medicines, marijuana, and other drugs.     Avoid tobacco and nicotine: Don't smoke, vape, or chew. If you need help quitting, talk to your doctor.   Practice safer sex. Getting tested, using condoms or dental dams, and limiting sex partners can help prevent STIs.     Use birth control if it's important to you to prevent pregnancy. Talk with your doctor about your choices and what might be best for you.   Prevent problems where you can. Protect your skin from too much sun, wash your  hands, brush your teeth twice a day, and wear a seat belt in the car.   Where can you learn more?  Go to [employer] and enter P072 to learn more about Well Visit, Ages 62 to 71: Care Instructions.  Current as of: February 23, 2024  Content Version: 14.6   2024-2025 [place], .   Care instructions adapted under license by [facility]. If you have questions about a medical condition or this instruction, always ask your healthcare professional. Romayne Alderman, [facility], disclaims any warranty or liability for your use of this information.    "

## 2024-06-17 NOTE — Progress Notes (Signed)
 "Well Adult Note  Name: Brandon Terrell Date: 06/17/2024   MRN: [id number] Sex: Male   Age: 63 y.o. Ethnicity: Non-Hispanic / Non Latino   DOB: [birth year] Race: White (non-Hispanic)      Brandon Terrell is here for a well adult exam.          Assessment & Plan  1. Health maintenance:  - He is here for a physical examination required for his employment. His blood pressure was slightly elevated during the visit, potentially due to recent coffee consumption and activity level.  - A comprehensive panel of fasting blood work will be ordered, including cholesterol and A1c levels. He has been advised to abstain from food and drink, except for water, prior to the test.  - The influenza vaccine will be administered today. He has been informed that the RSV vaccine is not recommended until the age of 71, unless he falls into a high-risk category. A recheck of his blood pressure will be conducted during his next visit.  Encounter for well adult exam without abnormal findings  Patient's past medical history, surgical history, family history, medications,  and allergies  were all reviewed and updated as appropriate today.   -     Lipid, Fasting; Future  -     Comprehensive Metabolic Panel; Future  -     Hemoglobin A1C; Future  Need for vaccination  -     Influenza, FLUCELVAX Trivalent, (age 40 mo+) IM, Preservative Free, 0.5mL    Results            Subjective   History of Present Illness  The patient presents for a physical examination required for his employment.    He is currently not on any prescribed medications but does take Centrum multivitamin for individuals over 50. He also uses Aleve to manage discomfort in his shoulder, knee, and back, which he attributes to the physical demands of his job. He has expressed interest in receiving an influenza vaccine during this visit. He underwent a colonoscopy at [facility] two years ago, which yielded normal results, and was advised to repeat the procedure in 10 years.     He  consumed coffee with sugar and cream this morning, unaware of the need to fast prior to his blood work. He reports no abdominal pain or tenderness. He has a history of ACL surgery on his right knee approximately 30 years ago, which becomes sore with overuse. He also had meniscus surgery on his left knee, resulting in similar symptoms. He experienced elbow bursitis, which was managed with antibiotics and has since improved. He was advised to have his elbow drained but did not proceed due to work commitments. He believes his elevated blood pressure readings today are due to his coffee consumption this morning. He typically experiences anxiety during dental visits, which often results in elevated blood pressure readings that require retesting after a period of relaxation.    Recreational Drugs: The patient smokes marijuana occasionally.  Coffee/Tea/Caffeine-containing Drinks: The patient drinks coffee.    PAST SURGICAL HISTORY:  - ACL surgery on right knee approximately 30 years ago  - Meniscus surgery on left knee  - Colonoscopy at [facility] 2 years ago    FAMILY HISTORY  His father had high blood pressure.    Review of Systems   Constitutional: Negative.    HENT: Negative.     Respiratory: Negative.     Cardiovascular: Negative.    Gastrointestinal: Negative.    Musculoskeletal: Negative.  Skin: Negative.    Neurological: Negative.    Psychiatric/Behavioral: Negative.            No Known Allergies  Prior to Visit Medications   Medication Sig Taking? Authorizing Provider   Multiple Vitamins-Minerals (THERAPEUTIC MULTIVITAMIN-MINERALS) tablet Take 1 tablet by mouth daily Yes [provider]     History reviewed. No pertinent past medical history.  Past Surgical History:   Procedure Laterality Date    ANTERIOR CRUCIATE LIGAMENT REPAIR      right     Family History   Problem Relation Age of Onset    Hypertension Father      Social History     Tobacco Use    Smoking status: Some Days     Types: Cigars     Smokeless tobacco: Never   Substance Use Topics    Alcohol use: Yes     Alcohol/week: 2.0 - 4.0 standard drinks of alcohol     Types: 2 - 4 Cans of beer per week     Comment: monthly    Drug use: Yes     Types: Marijuana (Weed)        Objective    Vital Signs  BP 138/88   Pulse 70   Ht 1.727 m (5' 7.99)   Wt 80.3 kg (177 lb)   SpO2 90%   BMI 26.92 kg/m     Wt Readings from Last 3 Encounters:   06/17/24 80.3 kg (177 lb)   03/25/24 75.3 kg (166 lb)   03/14/24 77.1 kg (170 lb)       Physical Exam  Mouth/Throat: No joint aches or pains noted in the throat.  Gastrointestinal: Soft, no tenderness, no distention, no masses.   Physical Exam  Vitals reviewed.   HENT:      Right Ear: Tympanic membrane, ear canal and external ear normal.      Left Ear: Tympanic membrane, ear canal and external ear normal.      Nose: No congestion.      Mouth/Throat:      Pharynx: No oropharyngeal exudate.   Cardiovascular:      Rate and Rhythm: Normal rate and regular rhythm.      Heart sounds: Normal heart sounds.   Pulmonary:      Effort: Pulmonary effort is normal.      Breath sounds: Normal breath sounds.   Abdominal:      General: Bowel sounds are normal.      Tenderness: There is no abdominal tenderness.   Musculoskeletal:         General: Normal range of motion.   Skin:     General: Skin is warm and dry.   Neurological:      Mental Status: He is alert and oriented to person, place, and time.   Psychiatric:         Mood and Affect: Mood normal.         Behavior: Behavior normal.           Personalized Preventive Plan  Current Health Maintenance Status  Immunization History   Administered Date(s) Administered    COVID-19, Inactive, PFIZER PURPLE top, DILUTE for use, (age 356 y+) 11/18/2019, 12/09/2019, 08/02/2020    Influenza, FLUCELVAX, (age 35 mo+) IM, Trivalent PF, 0.5mL 06/17/2024    TDaP, ADACEL  (age 52y-64y), MYRTICE (age 10y+), IM, 0.5mL 03/01/2011        Health Maintenance Due   Topic Date Due    Diabetes screen  Never done  Lipids  Never done    Colorectal Cancer Screen  Never done    Shingles vaccine (1 of 2) Never done    DTaP/Tdap/Td vaccine (2 - Td or Tdap) 02/28/2021    COVID-19 Vaccine (4 - 2025-26 season) 04/25/2024      Recommendations for Preventive Services Due: see orders and patient instructions/AVS.    The patient (or guardian, if applicable) and other individuals in attendance with the patient were advised that Artificial Intelligence will be utilized during this visit to record and process the conversation to generate a clinical note. The patient (or guardian, if applicable) and other individuals in attendance at the appointment consented to the use of AI, including the recording.          "

## 2024-06-18 ENCOUNTER — Inpatient Hospital Stay
Admit: 2024-06-18 | Payer: PRIVATE HEALTH INSURANCE | Primary: Student in an Organized Health Care Education/Training Program

## 2024-06-18 DIAGNOSIS — Z0000 Encounter for general adult medical examination without abnormal findings: Secondary | ICD-10-CM

## 2024-06-18 LAB — LIPID, FASTING
Cholesterol, Fasting: 159 mg/dL (ref 0–199)
HDL: 44 mg/dL (ref 40–60)
LDL Cholesterol: 105 mg/dL — ABNORMAL HIGH (ref <100)
Triglyceride, Fasting: 50 mg/dL (ref 0–150)
VLDL Cholesterol Calculated: 10 mg/dL

## 2024-06-18 LAB — COMPREHENSIVE METABOLIC PANEL
ALT: 13 U/L (ref 10–40)
AST: 22 U/L (ref 15–37)
Albumin/Globulin Ratio: 1.3 (ref 1.1–2.2)
Albumin: 4.1 g/dL (ref 3.4–5.0)
Alkaline Phosphatase: 80 U/L (ref 40–129)
Anion Gap: 9 (ref 3–16)
BUN: 15 mg/dL (ref 7–20)
CO2: 26 mmol/L (ref 21–32)
Calcium: 9.4 mg/dL (ref 8.3–10.6)
Chloride: 104 mmol/L (ref 99–110)
Creatinine: 0.9 mg/dL (ref 0.8–1.3)
Est, Glom Filt Rate: >90
Glucose: 102 mg/dL — ABNORMAL HIGH (ref 70–99)
Potassium: 4.7 mmol/L (ref 3.5–5.1)
Sodium: 139 mmol/L (ref 136–145)
Total Bilirubin: <0.2 mg/dL (ref 0.0–1.0)
Total Protein: 7.3 g/dL (ref 6.4–8.2)

## 2024-06-19 LAB — HEMOGLOBIN A1C
Estimated Avg Glucose: 114 mg/dL
Hemoglobin A1C: 5.6 %

## 2024-06-21 ENCOUNTER — Inpatient Hospital Stay

## 2024-06-21 ENCOUNTER — Inpatient Hospital Stay: Admitting: Physician Assistant

## 2024-06-21 ENCOUNTER — Inpatient Hospital Stay: Admitting: Oncology

## 2024-06-21 VITALS — BP 145/86 | HR 63 | Temp 97.9°F | Resp 18 | Wt 253.3 lb

## 2024-06-21 DIAGNOSIS — D751 Secondary polycythemia: Secondary | ICD-10-CM | POA: Diagnosis not present

## 2024-06-21 DIAGNOSIS — R7989 Other specified abnormal findings of blood chemistry: Secondary | ICD-10-CM

## 2024-06-21 DIAGNOSIS — R918 Other nonspecific abnormal finding of lung field: Secondary | ICD-10-CM | POA: Insufficient documentation

## 2024-06-21 NOTE — Assessment & Plan Note (Addendum)
-   Labs on 03/18/2021 with ferritin 648.  Saturation 30.  Hepatitis C antibody negative. - Hereditary hemochromatosis testing was negative. - CT chest and abdomen on 04/10/2021 with subtle nodular configuration of liver contour suspicious of cirrhosis.  Spleen reported as normal size. - Ultrasound abdomen on 03/08/2021 showed increased echogenicity in the liver thought to be secondary to hepatic steatosis - Labs from 12/11/2022 show downtrending ferritin 128, iron saturation 57%, serum iron 195. - Elevated ferritin thought to be secondary to liver disease.  - He is being actively worked up by gastroenterology for newly elevated LFTs and suspected liver cirrhosis - Patient also has had intermittent mild thrombocytopenia related to liver disease  - PLAN: Repeat iron panel at follow-up in 1 year

## 2024-06-21 NOTE — Assessment & Plan Note (Addendum)
-   He has required intermittent phlebotomy, and reported improvement in energy levels which lasted 2 weeks.  Last phlebotomy in October 2024.   - Most recent labs (06/19/2023): Hgb 18.3 with hematocrit 52.4 %.  (Mild thrombocytopenia with platelets 138 likely due to liver disease) - DIFFERENTIAL DIAGNOSIS favors likely erythrocytosis from OSA or underlying lung disease (at risk for COPD), although neither of these have been formally diagnosed. - PLAN: Repeat CBC w/ possible phlebotomy every 3 months. - Per Dr. Katragadda, will consider phlebotomy if hematocrit >50%. - Recommend aspirin  81 mg daily in the setting of secondary erythrocytosis and additional cardiac risk factors (hypertension, prediabetic, obesity, history of PE) - Sent referral for sleep study (unexplained erythrocytosis and morning headaches) - Labs and RTC in 1 year

## 2024-06-21 NOTE — Assessment & Plan Note (Deleted)
 CT chest on 10/11/2021 showed stable right lung nodules.  His PMD ordered a CT chest without contrast on 06/06/2022 at [facility] which showed a stable 3 mm right lung nodule.  No follow-up necessary based on radiologist assessment, but he continues to meet criteria for LDCT chest annually for lung cancer screening.

## 2024-06-21 NOTE — Progress Notes (Unsigned)
 [name] OFFICE PROGRESS NOTE  [name], Ricardo E, PA-C  ASSESSMENT & PLAN:    Assessment & Plan Erythrocytosis - He has required intermittent phlebotomy, and reported improvement in energy levels which lasted 2 weeks.  Last phlebotomy in October 2024.   - Most recent labs (06/19/2023): Hgb 18.3 with hematocrit 52.4 %.  (Mild thrombocytopenia with platelets 138 likely due to liver disease) - DIFFERENTIAL DIAGNOSIS favors likely erythrocytosis from OSA or underlying lung disease (at risk for COPD), although neither of these have been formally diagnosed. - PLAN: Repeat CBC w/ possible phlebotomy every 3 months. - Per Dr. Katragadda, will consider phlebotomy if hematocrit >50%. - Recommend aspirin  81 mg daily in the setting of secondary erythrocytosis and additional cardiac risk factors (hypertension, prediabetic, obesity, history of PE) - Sent referral for sleep study (unexplained erythrocytosis and morning headaches) - Labs and RTC in 1 year  Elevated ferritin - Labs on 03/18/2021 with ferritin 648.  Saturation 30.  Hepatitis C antibody negative. - Hereditary hemochromatosis testing was negative. - CT chest and abdomen on 04/10/2021 with subtle nodular configuration of liver contour suspicious of cirrhosis.  Spleen reported as normal size. - Ultrasound abdomen on 03/08/2021 showed increased echogenicity in the liver thought to be secondary to hepatic steatosis - Labs from 12/11/2022 show downtrending ferritin 128, iron saturation 57%, serum iron 195. - Elevated ferritin thought to be secondary to liver disease.  - He is being actively worked up by gastroenterology for newly elevated LFTs and suspected liver cirrhosis - Patient also has had intermittent mild thrombocytopenia related to liver disease  - PLAN: Repeat iron panel at follow-up in 1 year  Lung nodules CT chest on 10/11/2021 showed stable right lung nodules.  His PMD ordered a CT chest without contrast on  06/06/2022 at [facility] which showed a stable 3 mm right lung nodule.  No follow-up necessary based on radiologist assessment, but he continues to meet criteria for LDCT chest annually for lung cancer screening.     No orders of the defined types were placed in this encounter.   INTERVAL HISTORY: Patient returns for follow-up.  [name] 63 y.o. male returns for routine follow-up of erythrocytosis, elevated ferritin, and liver disease.  He was last seen by Ricardo Barefoot PA-C on 12/18/2022.   At today's visit, he reports feeling fair.  No recent hospitalizations, surgeries, or changes in baseline health status. Last phlebotomy was on 06/19/2023, which he tolerated well with some improved energy.  He notes that he had been waking up with morning headaches prior to phlebotomy, but these have improved.  He reports intermittent blurry vision and report that his fingertips will sometimes turn white in the cold.  He denies any other vasomotor symptoms such as aquagenic pruritus, erythromelalgia, dizziness, tinnitus, neurologic deficits, or peripheral neuropathy.  No abdominal pain, early satiety, or symptoms of splenomegaly.  Denies any constitutional symptoms.  No excessive fatigue or sluggishness.  He has 75% energy and 100% appetite. He has been maintaining a stable weight.  We reviewed ***  SUMMARY OF HEMATOLOGIC HISTORY: Oncology History   No history exists.     CBC    Component Value Date/Time   WBC 5.8 06/15/2024 1434   RBC 5.04 06/15/2024 1434   HGB 16.5 06/15/2024 1434   HCT 46.8 06/15/2024 1434   PLT 172 06/15/2024 1434   MCV 92.9 06/15/2024 1434   MCH 32.7 06/15/2024 1434   MCHC 35.3 06/15/2024 1434   RDW 12.2 06/15/2024 1434  LYMPHSABS 1.9 06/15/2024 1434   MONOABS 0.4 06/15/2024 1434   EOSABS 0.2 06/15/2024 1434   BASOSABS 0.1 06/15/2024 1434       Latest Ref Rng & Units 03/29/2024    9:05 AM 02/02/2023    8:00 AM 09/12/2010    1:01 PM  CMP  Glucose 70 - 99 mg/dL 899   94  859   BUN 6 - 20 mg/dL 13  13  11    Creatinine 0.61 - 1.24 mg/dL 9.05  9.09  8.78   Sodium 135 - 145 mmol/L 139  136  133   Potassium 3.5 - 5.1 mmol/L 5.0  4.0  3.4   Chloride 98 - 111 mmol/L 103  102  103   CO2 22 - 32 mmol/L 26  26  23    Calcium 8.9 - 10.3 mg/dL 9.4  8.9  8.7   Total Protein 6.5 - 8.1 g/dL 7.5  6.8    Total Bilirubin 0.0 - 1.2 mg/dL 1.2  1.0    Alkaline Phos 38 - 126 U/L 60  59    AST 15 - 41 U/L 47  30    ALT 0 - 44 U/L 52  31       Lab Results  Component Value Date   FERRITIN 379 (H) 06/15/2024    Vitals:   06/21/24 1310 06/21/24 1323  BP: (!) 151/95 (!) 145/86  Pulse: 63   Resp: 18   Temp: 97.9 F (36.6 C)   SpO2: 100%     Review of System:  ROS  Physical Exam: Physical Exam   I spent *** minutes dedicated to the care of this patient (face-to-face and non-face-to-face) on the date of the encounter to include what is described in the assessment and plan.,  Delon Hope, NP 06/21/2024 1:29 PM

## 2024-06-21 NOTE — Progress Notes (Signed)
 No Phlebotomy needed today per providers order parameters for HCT of 46.8.

## 2024-06-22 ENCOUNTER — Inpatient Hospital Stay: Payer: PPO

## 2024-06-22 ENCOUNTER — Inpatient Hospital Stay: Payer: PPO | Admitting: Physician Assistant

## 2024-07-14 ENCOUNTER — Ambulatory Visit
Admit: 2024-07-14 | Discharge: 2024-07-14 | Payer: PRIVATE HEALTH INSURANCE | Attending: Family Medicine | Primary: Student in an Organized Health Care Education/Training Program

## 2024-07-14 VITALS — BP 164/109 | HR 99 | Temp 98.50000°F | Ht 67.99 in | Wt 177.0 lb

## 2024-07-14 DIAGNOSIS — S51831A Puncture wound without foreign body of right forearm, initial encounter: Principal | ICD-10-CM

## 2024-07-14 MED ORDER — AMOXICILLIN-POT CLAVULANATE 875-125 MG PO TABS
875-125 | ORAL_TABLET | Freq: Two times a day (BID) | ORAL | 0 refills | Status: AC
Start: 2024-07-14 — End: 2024-07-24

## 2024-07-14 NOTE — Progress Notes (Signed)
 "  Brandon Terrell (DOB:  1961/05/17) is a 63 y.o. male,Established patient, here for evaluation of the following chief complaint(s):  Animal Bite (Right Arm-this morning. Pain and unable to turn wheel.)      ASSESSMENT/PLAN:    ICD-10-CM    1. Puncture wound of right forearm, initial encounter  S51.831A Tdap, BOOSTRIX, (age 44 yrs+), IM     amoxicillin -clavulanate (AUGMENTIN ) 875-125 MG per tablet      2. Abrasion, forearm w/o infection  S50.819A amoxicillin -clavulanate (AUGMENTIN ) 875-125 MG per tablet      3. Cat bite of right forearm, initial encounter  S51.851A amoxicillin -clavulanate (AUGMENTIN ) 875-125 MG per tablet    W55.01XA       4. Need for DTaP vaccination  Z23       5. Elevated blood pressure reading without diagnosis of hypertension  R03.0 [facility] [place]        Consent obtained to give Tdapt  (last Tdapt 2012)  Wounds cleaned with Hibiclens , then with saline  Mostly abrasions noted on dorsal surface right distal forearm and just proximal to right wrist  Maybe 1-2 small puncture wounds  distal right forearm  No active bleeding, unable to pull wound edges apart  Neuro vascular exam distally intact,   Able to make fist/extend hand /finger , associated with some discomfort lower right forearm.. ,  distal tendon function appears intact      Keep wound clean wash with antibacterial soap (dial)   Can apply topical antibiotic ointment (bacitracin)  2-3 times a day,  clean forearm before new application of antibiotic ointment  Ibuprofen/tylenol (if no contraindications) prn pain    leave open to air at home- can cover if in a dirty environment  Return  anytime if increase in local pain, increase redness or red streak forming or if increase swelling or drainage noted  Take antibiotic if prescribed  Return in 10-12 days if not better days         SUBJECTIVE/OBJECTIVE:  Patient presents with:  Animal Bite: Right Arm-this morning. Pain and unable to turn wheel.    Was laying in bed this  morning with one of his cats by his side/right arm, when another of his cat jumped up on his bed and a fight broke out between the two..  Patient got scratched  ( from teeth or claws), puncture wounds from cat bite  Went into work , repetitive use of hands/arms -- felt fine  but about 4 hrs later started to feel pain , in lower   right forearm /wrist and fingers.  Increase pain with hand grasping  twisting  wrist /grabbing / turning steering wheel  No distal numbness      History provided by:  Patient  Language interpreter used: No    Animal Bite         Vitals:    07/14/24 1645 07/14/24 1724   BP: (!) 162/100 (!) 164/109   Pulse: 99    Temp: 98.5 F (36.9 C)    TempSrc: Temporal    SpO2: 96%    Weight: 80.3 kg (177 lb)    Height: 1.727 m (5' 7.99)        Review of Systems    Physical Exam    Physical  Vitals signs: reviewed  Constitutional:  appearance: well nourished ..  does not appear acutely ill  ..no distress   Eyes:  Pupil: equal-round-reactive to light, no photophobia, EOMI            Cornea: clear            Sclera: clear, non injected, non icteric     Mouth:                 Mucous membranes moist               Oropharynx:  clear   Neck:    supple,               Pulmonary/Lungs:  effort normal, no stridor                                 Auscultation: good air movement / breath sounds normal  Cardio-vascular:  normal rate and rhythm                              Heart sounds normal-no murmur- no rub   Abdomen:  Non tender,   Muscular skeleton:    Right lower forearm, wrist/fingers---motor strength normal / muscle tone normal                                Multiple abrasions/scratches , few small puncture wounds  -- no active bleeding     even after cleaning, unable to separated wound edges,  no peripheral erythema        Neuro vascular exam intact distally.. normal distal tendon function                            Neurological:  no focal deficit  Skin: no rash   Psychiatric:   behavior  appropriate--no confusion    An electronic signature was used to authenticate this note.    --Charlie JINNY Slocumb, MD   "

## 2024-07-14 NOTE — Patient Instructions (Signed)
"  Keep wound clean wash with antibacterial soap (dial)   Can apply topical antibiotic ointment (bacitracin)  2-3 times a day,  clean forearm before new application of antibiotic ointment  Ibuprofen/tylenol (if no contraindications) prn pain    leave open to air at home- can cover if in a dirty environment  Return  anytime if increase in local pain, increase redness or red streak forming or if increase swelling or drainage noted  Take antibiotic if prescribed  Return in 10-12 days if not better days   "

## 2024-08-16 ENCOUNTER — Encounter: Payer: Self-pay | Admitting: Gastroenterology

## 2024-08-22 ENCOUNTER — Encounter: Payer: Self-pay | Admitting: *Deleted

## 2024-08-30 ENCOUNTER — Encounter: Payer: Self-pay | Admitting: Internal Medicine

## 2024-08-30 ENCOUNTER — Ambulatory Visit: Attending: Internal Medicine | Admitting: Internal Medicine

## 2024-08-30 VITALS — BP 126/80 | Ht 69.0 in | Wt 255.8 lb

## 2024-08-30 DIAGNOSIS — I10 Essential (primary) hypertension: Secondary | ICD-10-CM | POA: Insufficient documentation

## 2024-08-30 DIAGNOSIS — Z01810 Encounter for preprocedural cardiovascular examination: Secondary | ICD-10-CM

## 2024-08-30 DIAGNOSIS — R9439 Abnormal result of other cardiovascular function study: Secondary | ICD-10-CM | POA: Insufficient documentation

## 2024-08-30 MED ORDER — METOPROLOL TARTRATE 100 MG PO TABS: 100.0000 mg | ORAL_TABLET | Freq: Once | ORAL | 0 refills | Status: AC

## 2024-08-30 NOTE — Progress Notes (Signed)
 "    Cardiology Office Note  Date: 08/30/2024   ID: XENG KUCHER, DOB Jan 15, 1961, MRN [id number]  PCP:  Jeanette Comer BRAVO, PA-C  Cardiologist:  None Electrophysiologist:  None   History of Present Illness: [name] is a 64 y.o. male known to have HTN was referred to cardiology clinic for evaluation of abnormal stress test.  Patient reported having chest pains with stress and SOB with exertion due to which he underwent Lexiscan  at [facility] that showed no significant ischemia on perfusion imaging but there was a fixed defect in the entire inferior wall, consistent with probable attenuation artifact versus scar.  LVEF was normal.  Patient is here today for further evaluation.  Collateral history is obtained from the wife.  She reported recent diagnosis of liver cirrhosis.  He drinks alcohol  once in a while, previously daily.  AST and ALT were elevated.  I reviewed and discussed stress test findings with the patient today.  He does not have any more chest pains.  He never had chest pains with exertion.  He still has DOE.  He also gained weight recently.  No leg swelling.  No exertional dizziness, syncope, palpitations.  EKG today showed NSR, no new ischemia.  Past Medical History:  Diagnosis Date   Alcoholism (HCC)    Anxiety    Arthritis    Complication of anesthesia    Elevated Blood pressure (critical) during leg surgery   Depression    Diabetes (HCC)    Gallstones    GERD (gastroesophageal reflux disease)    Hemochromatosis    History of kidney stones    Hypertension    Obesity     Past Surgical History:  Procedure Laterality Date   ANKLE SURGERY     left   COLONOSCOPY WITH PROPOFOL  N/A 02/04/2023   Procedure: COLONOSCOPY WITH PROPOFOL ;  Surgeon: Shaaron Lamar HERO, MD;  Location: [facility];  Service: Endoscopy;  Laterality: N/A;  10:45 am, asa 3   ESOPHAGOGASTRODUODENOSCOPY (EGD) WITH PROPOFOL  N/A 02/04/2023   Procedure: ESOPHAGOGASTRODUODENOSCOPY (EGD) WITH PROPOFOL ;   Surgeon: Shaaron Lamar HERO, MD;  Location: [facility];  Service: Endoscopy;  Laterality: N/A;   KNEE SURGERY     right   LEG SURGERY     bilateral    Current Outpatient Medications  Medication Sig Dispense Refill   ALPRAZolam (XANAX) 1 MG tablet Take 1-2 mg by mouth See admin instructions. Take 1 tablet (1 mg) by mouth in the morning & take 2 tablets (2 mg) by mouth at bedtime.     ASPIRIN  LOW DOSE 81 MG tablet TAKE 1 TABLET BY MOUTH DAILY. SWALLOW whole 90 tablet 3   ibuprofen (ADVIL) 800 MG tablet Take 800 mg by mouth 2 (two) times daily as needed (pain.).     lisinopril (PRINIVIL,ZESTRIL) 5 MG tablet Take 5 mg by mouth in the morning.     omeprazole (PRILOSEC) 20 MG capsule Take 20 mg by mouth daily before breakfast.     No current facility-administered medications for this visit.   Allergies:  Clindamycin/lincomycin and Penicillins   Social History: The patient  reports that he quit smoking about 2 years ago. His smoking use included cigarettes. He started smoking about 22 years ago. He has a 40 pack-year smoking history. He has never used smokeless tobacco. He reports current alcohol  use. He reports that he does not use drugs.   Family History: The patient's family history includes Cancer in his sister and unknown relative; Liver cancer  in his mother.   ROS:  Please see the history of present illness. Otherwise, complete review of systems is positive for none  All other systems are reviewed and negative.   Physical Exam: VS:  There were no vitals taken for this visit., BMI There is no height or weight on file to calculate BMI.  Wt Readings from Last 3 Encounters:  06/21/24 253 lb 4.9 oz (114.9 kg)  03/17/24 258 lb (117 kg)  09/22/23 244 lb 12.8 oz (111 kg)    General: Patient appears comfortable at rest. HEENT: Conjunctiva and lids normal, oropharynx clear with moist mucosa. Neck: Supple, no elevated JVP or carotid bruits, no thyromegaly. Lungs: Clear to auscultation,  nonlabored breathing at rest. Cardiac: Regular rate and rhythm, no S3 or significant systolic murmur, no pericardial rub. Abdomen: Soft, nontender, no hepatomegaly, bowel sounds present, no guarding or rebound. Extremities: No pitting edema, distal pulses 2+. Skin: Warm and dry. Musculoskeletal: No kyphosis. Neuropsychiatric: Alert and oriented x3, affect grossly appropriate.  Recent Labwork: 03/29/2024: ALT 52; AST 47; BUN 13; Creatinine, Ser 0.94; Potassium 5.0; Sodium 139 06/15/2024: Hemoglobin 16.5; Platelets 172  No results found for: CHOL, TRIG, HDL, CHOLHDL, VLDL, LDLCALC, LDLDIRECT  Other Studies Reviewed Today: PCP records reviewed.  Assessment and Plan:  Abnormal stress test - No ischemia.  Fixed defect in the anterior inferior wall, deviation artifact versus scar.  Does not have chest pain with exertion but has chest pain with stress that resolved now.  However he still has DOE. - EKG showed NSR, poor R wave progression. - Obtain echocardiogram for DOE and CT cardiac ( to r/o CTO RCA).  HTN, controlled - Continue lisinopril 5 mg once daily.  45 spent in reviewing prior medical records, reports, more than the labs, discussion and documentation.   Medication Adjustments/Labs and Tests Ordered: Current medicines are reviewed at length with the patient today.  Concerns regarding medicines are outlined above.    Disposition:  Follow up pending results  Signed Elchanan Bob Priya Trey Gulbranson, MD, 08/30/2024 12:57 PM    [facility] [address] [place], [place], [place] [address]  "

## 2024-08-30 NOTE — Patient Instructions (Addendum)
 Medication Instructions:  Your physician recommends that you continue on your current medications as directed. Please refer to the Current Medication list given to you today.   Labwork: BMET 1-2 weeks prior to CTA at [facility] in [place]   Testing/Procedures: Your physician has requested that you have an echocardiogram. Echocardiography is a painless test that uses sound waves to create images of your heart. It provides your doctor with information about the size and shape of your heart and how well your hearts chambers and valves are working. This procedure takes approximately one hour. There are no restrictions for this procedure. Please do NOT wear cologne, perfume, aftershave, or lotions (deodorant is allowed). Please arrive 15 minutes prior to your appointment time.  Please note: We ask at that you not bring children with you during ultrasound (echo/ vascular) testing. Due to room size and safety concerns, children are not allowed in the ultrasound rooms during exams. Our front office staff cannot provide observation of children in our lobby area while testing is being conducted. An adult accompanying a patient to their appointment will only be allowed in the ultrasound room at the discretion of the ultrasound technician under special circumstances. We apologize for any inconvenience.    Your cardiac CT will be scheduled at one of the below locations:   [facility] [address] [place], [place] [address] [phone] (Severe contrast allergies only)  OR   [facility] [address]  [place], [place] [address]   If scheduled at [facility], please arrive at the [facility] and Children's Entrance (Entrance C2) of [facility] 30 minutes prior to test start time. You can use the FREE valet parking offered at entrance C (encouraged to control the heart rate for the test)  Proceed to the [facility] Radiology Department (first  floor) to check-in and test prep.  All radiology patients and guests should use entrance C2 at [facility], accessed from [facility], even though the hospital's physical address listed is [address].  If scheduled at the Heart and Vascular Tower at [address], please enter the parking lot using the [address] entrance and use the FREE valet service at the patient drop-off area. Enter the building and check-in with registration on the main floor.   There is spacious parking and easy access to the radiology department from the [facility] Heart and Vascular entrance. Please enter here and check-in with the desk attendant.    Please follow these instructions carefully (unless otherwise directed):  An IV will be required for this test and Nitroglycerin will be given.  Hold all erectile dysfunction medications at least 3 days (72 hrs) prior to test. (Ie viagra, cialis, sildenafil, tadalafil, etc)   On the Night Before the Test: Be sure to Drink plenty of water. Do not consume any caffeinated/decaffeinated beverages or chocolate 12 hours prior to your test. Do not take any antihistamines 12 hours prior to your test.  If the patient has contrast allergy: No allergy   On the Day of the Test: Drink plenty of water until 1 hour prior to the test. Do not eat any food 1 hour prior to test. You may take your regular medications prior to the test.  Take Metoprolol  100 mg (Lopressor ) two hours prior to test. If you take Furosemide/Hydrochlorothiazide/Spironolactone/Chlorthalidone, please HOLD on the morning of the test. Patients who wear a continuous glucose monitor MUST remove the device prior to scanning. FEMALES-  please wear underwire-free bra if available, avoid dresses & tight clothing      After the Test: Drink plenty of water. After receiving IV contrast, you may experience a mild flushed feeling. This is normal. On occasion, you may experience a mild  rash up to 24 hours after the test. This is not dangerous. If this occurs, you can take Benadryl 25 mg, Zyrtec, Claritin, or Allegra and increase your fluid intake. (Patients taking Tikosyn should avoid Benadryl, and may take Zyrtec, Claritin, or Allegra) If you experience trouble breathing, this can be serious. If it is severe call 911 IMMEDIATELY. If it is mild, please call our office.  We will call to schedule your test 2-4 weeks out understanding that some insurance companies will need an authorization prior to the service being performed.   For more information and frequently asked questions, please visit our website : [email or web address]  For non-scheduling related questions, please contact the cardiac imaging nurse navigator should you have any questions/concerns: Cardiac Imaging Nurse Navigators Direct Office Dial: [phone]   For scheduling needs, including cancellations and rescheduling, please call Brittany, [phone].   Follow-Up: Your physician recommends that you schedule a follow-up appointment in: Pending Results  Any Other Special Instructions Will Be Listed Below (If Applicable). Thank you for choosing [facility]!     If you need a refill on your cardiac medications before your next appointment, please call your pharmacy.

## 2024-09-07 ENCOUNTER — Ambulatory Visit: Attending: Internal Medicine

## 2024-09-07 ENCOUNTER — Telehealth: Payer: Self-pay | Admitting: Internal Medicine

## 2024-09-07 DIAGNOSIS — R9439 Abnormal result of other cardiovascular function study: Secondary | ICD-10-CM | POA: Diagnosis not present

## 2024-09-07 NOTE — Telephone Encounter (Signed)
 Patients wife called in and patient is having some issues wanted to make an apointment. Patient wife states he is not happy with Dr Shaaron or [facility]. Pt wife also states of why patient does not want to see Dr Shaaron any longer because Dr Shaaron made comments about patients drinking habits.Patient wants to see Dr Cindie instead.

## 2024-09-08 LAB — ECHOCARDIOGRAM COMPLETE
AR max vel: 3.3 cm2
AV Area VTI: 3.27 cm2
AV Area mean vel: 3.2 cm2
AV Mean grad: 1.9 mmHg
AV Peak grad: 3.9 mmHg
Ao pk vel: 0.98 m/s
Area-P 1/2: 3.27 cm2
Calc EF: 64.7 %
S' Lateral: 3.5 cm
Single Plane A2C EF: 68.5 %
Single Plane A4C EF: 57.4 %

## 2024-09-10 LAB — BASIC METABOLIC PANEL WITH GFR
BUN/Creatinine Ratio: 13 (ref 10–24)
BUN: 13 mg/dL (ref 8–27)
CO2: 23 mmol/L (ref 20–29)
Calcium: 9.9 mg/dL (ref 8.6–10.2)
Chloride: 98 mmol/L (ref 96–106)
Creatinine, Ser: 1.03 mg/dL (ref 0.76–1.27)
Glucose: 115 mg/dL — ABNORMAL HIGH (ref 70–99)
Potassium: 4.3 mmol/L (ref 3.5–5.2)
Sodium: 138 mmol/L (ref 134–144)
eGFR: 83 mL/min/1.73

## 2024-09-12 ENCOUNTER — Ambulatory Visit: Payer: Self-pay | Admitting: Internal Medicine

## 2024-09-15 ENCOUNTER — Encounter (HOSPITAL_COMMUNITY): Payer: Self-pay

## 2024-09-16 ENCOUNTER — Ambulatory Visit (HOSPITAL_COMMUNITY)
Admission: RE | Admit: 2024-09-16 | Discharge: 2024-09-16 | Disposition: A | Source: Ambulatory Visit | Attending: Internal Medicine | Admitting: Internal Medicine

## 2024-09-16 DIAGNOSIS — R9430 Abnormal result of cardiovascular function study, unspecified: Secondary | ICD-10-CM

## 2024-09-16 DIAGNOSIS — R9439 Abnormal result of other cardiovascular function study: Secondary | ICD-10-CM | POA: Diagnosis present

## 2024-09-16 DIAGNOSIS — I2510 Atherosclerotic heart disease of native coronary artery without angina pectoris: Secondary | ICD-10-CM | POA: Diagnosis not present

## 2024-09-16 DIAGNOSIS — R079 Chest pain, unspecified: Secondary | ICD-10-CM | POA: Insufficient documentation

## 2024-09-16 MED ORDER — NITROGLYCERIN 0.4 MG SL SUBL
0.8000 mg | SUBLINGUAL_TABLET | Freq: Once | SUBLINGUAL | Status: AC
  Administered 2024-09-16: 0.8 mg via SUBLINGUAL

## 2024-09-16 MED ORDER — IOHEXOL 350 MG/ML SOLN
100.0000 mL | Freq: Once | INTRAVENOUS | Status: AC | PRN
  Administered 2024-09-16: 100 mL via INTRAVENOUS

## 2024-12-20 ENCOUNTER — Inpatient Hospital Stay

## 2024-12-27 ENCOUNTER — Inpatient Hospital Stay: Admitting: Physician Assistant
